# Patient Record
Sex: Female | Born: 1935
Health system: Southern US, Community
[De-identification: ages and names within clinical notes are randomized; demographics above are authoritative.]

## PROBLEM LIST (undated history)

## (undated) DIAGNOSIS — M419 Scoliosis, unspecified: Secondary | ICD-10-CM

## (undated) DIAGNOSIS — M810 Age-related osteoporosis without current pathological fracture: Secondary | ICD-10-CM

## (undated) DIAGNOSIS — F988 Other specified behavioral and emotional disorders with onset usually occurring in childhood and adolescence: Secondary | ICD-10-CM

## (undated) DIAGNOSIS — F32A Depression, unspecified: Secondary | ICD-10-CM

## (undated) DIAGNOSIS — K56609 Unspecified intestinal obstruction, unspecified as to partial versus complete obstruction: Secondary | ICD-10-CM

## (undated) DIAGNOSIS — M1990 Unspecified osteoarthritis, unspecified site: Secondary | ICD-10-CM

## (undated) DIAGNOSIS — I10 Essential (primary) hypertension: Secondary | ICD-10-CM

## (undated) DIAGNOSIS — C801 Malignant (primary) neoplasm, unspecified: Secondary | ICD-10-CM

## (undated) DIAGNOSIS — M4800 Spinal stenosis, site unspecified: Secondary | ICD-10-CM

## (undated) DIAGNOSIS — G709 Myoneural disorder, unspecified: Secondary | ICD-10-CM

## (undated) DIAGNOSIS — F329 Major depressive disorder, single episode, unspecified: Secondary | ICD-10-CM

## (undated) DIAGNOSIS — I4891 Unspecified atrial fibrillation: Secondary | ICD-10-CM

## (undated) DIAGNOSIS — K219 Gastro-esophageal reflux disease without esophagitis: Secondary | ICD-10-CM

## (undated) DIAGNOSIS — H269 Unspecified cataract: Secondary | ICD-10-CM

## (undated) HISTORY — PX: COLONOSCOPY: SHX174

## (undated) HISTORY — DX: Unspecified atrial fibrillation: I48.91

## (undated) HISTORY — DX: Unspecified cataract: H26.9

## (undated) HISTORY — PX: FRACTURE SURGERY: SHX138

## (undated) HISTORY — DX: Age-related osteoporosis without current pathological fracture: M81.0

## (undated) HISTORY — PX: EYE SURGERY: SHX253

## (undated) HISTORY — DX: Gastro-esophageal reflux disease without esophagitis: K21.9

## (undated) HISTORY — PX: JOINT REPLACEMENT: SHX530

## (undated) HISTORY — PX: SIGMOID RESECTION / RECTOPEXY: SUR1294

---

## 1970-10-11 HISTORY — PX: ABDOMINAL HYSTERECTOMY: SHX81

## 2009-01-04 ENCOUNTER — Encounter: Admission: RE | Admit: 2009-01-04 | Discharge: 2009-01-04 | Payer: Self-pay | Admitting: Neurology

## 2009-05-15 ENCOUNTER — Encounter: Admission: RE | Admit: 2009-05-15 | Discharge: 2009-05-15 | Payer: Self-pay | Admitting: Neurosurgery

## 2009-08-04 ENCOUNTER — Emergency Department (HOSPITAL_COMMUNITY): Admission: EM | Admit: 2009-08-04 | Discharge: 2009-08-04 | Payer: Self-pay | Admitting: Emergency Medicine

## 2010-02-05 ENCOUNTER — Encounter: Admission: RE | Admit: 2010-02-05 | Discharge: 2010-02-05 | Payer: Self-pay | Admitting: Neurosurgery

## 2010-04-23 ENCOUNTER — Encounter
Admission: RE | Admit: 2010-04-23 | Discharge: 2010-07-22 | Payer: Self-pay | Admitting: Physical Medicine & Rehabilitation

## 2010-05-01 ENCOUNTER — Ambulatory Visit: Payer: Self-pay | Admitting: Physical Medicine & Rehabilitation

## 2010-05-07 ENCOUNTER — Ambulatory Visit: Payer: Self-pay | Admitting: Physical Medicine & Rehabilitation

## 2010-05-28 ENCOUNTER — Ambulatory Visit: Payer: Self-pay | Admitting: Physical Medicine & Rehabilitation

## 2010-07-09 ENCOUNTER — Ambulatory Visit (HOSPITAL_COMMUNITY)
Admission: RE | Admit: 2010-07-09 | Discharge: 2010-07-09 | Payer: Self-pay | Admitting: Physical Medicine & Rehabilitation

## 2010-07-09 ENCOUNTER — Ambulatory Visit: Payer: Self-pay | Admitting: Physical Medicine & Rehabilitation

## 2010-07-28 ENCOUNTER — Encounter
Admission: RE | Admit: 2010-07-28 | Discharge: 2010-09-17 | Payer: Self-pay | Source: Home / Self Care | Attending: Physical Medicine & Rehabilitation | Admitting: Physical Medicine & Rehabilitation

## 2010-07-31 ENCOUNTER — Ambulatory Visit: Payer: Self-pay | Admitting: Physical Medicine & Rehabilitation

## 2010-08-13 ENCOUNTER — Ambulatory Visit: Payer: Self-pay | Admitting: Physical Medicine & Rehabilitation

## 2010-08-31 ENCOUNTER — Encounter
Admission: RE | Admit: 2010-08-31 | Discharge: 2010-10-10 | Payer: Self-pay | Source: Home / Self Care | Attending: Physical Medicine & Rehabilitation | Admitting: Physical Medicine & Rehabilitation

## 2010-09-17 ENCOUNTER — Ambulatory Visit: Payer: Self-pay | Admitting: Physical Medicine & Rehabilitation

## 2010-09-29 ENCOUNTER — Encounter
Admission: RE | Admit: 2010-09-29 | Discharge: 2010-10-06 | Payer: Self-pay | Source: Home / Self Care | Attending: Physical Medicine & Rehabilitation | Admitting: Physical Medicine & Rehabilitation

## 2010-10-12 ENCOUNTER — Encounter
Admission: RE | Admit: 2010-10-12 | Discharge: 2010-11-10 | Payer: Self-pay | Source: Home / Self Care | Attending: Physical Medicine & Rehabilitation | Admitting: Physical Medicine & Rehabilitation

## 2010-10-15 ENCOUNTER — Encounter
Admission: RE | Admit: 2010-10-15 | Discharge: 2010-11-10 | Payer: Self-pay | Source: Home / Self Care | Attending: Physical Medicine & Rehabilitation | Admitting: Physical Medicine & Rehabilitation

## 2010-10-16 ENCOUNTER — Ambulatory Visit
Admission: RE | Admit: 2010-10-16 | Discharge: 2010-10-16 | Payer: Self-pay | Source: Home / Self Care | Attending: Physical Medicine & Rehabilitation | Admitting: Physical Medicine & Rehabilitation

## 2010-10-26 ENCOUNTER — Ambulatory Visit: Admit: 2010-10-26 | Payer: Self-pay | Admitting: Physical Medicine & Rehabilitation

## 2010-10-27 ENCOUNTER — Encounter: Admit: 2010-10-27 | Payer: Self-pay | Admitting: Physical Medicine & Rehabilitation

## 2010-10-29 ENCOUNTER — Encounter: Admit: 2010-10-29 | Payer: Self-pay | Admitting: Physical Medicine & Rehabilitation

## 2010-11-13 ENCOUNTER — Ambulatory Visit: Admit: 2010-11-13 | Payer: Self-pay | Admitting: Physical Medicine & Rehabilitation

## 2010-11-13 ENCOUNTER — Ambulatory Visit: Payer: Medicare Other | Admitting: Physical Medicine & Rehabilitation

## 2010-11-13 ENCOUNTER — Ambulatory Visit: Payer: Medicare Other | Attending: Physical Medicine & Rehabilitation

## 2010-11-13 DIAGNOSIS — M19029 Primary osteoarthritis, unspecified elbow: Secondary | ICD-10-CM

## 2010-11-13 DIAGNOSIS — M249 Joint derangement, unspecified: Secondary | ICD-10-CM | POA: Insufficient documentation

## 2010-11-13 DIAGNOSIS — M7520 Bicipital tendinitis, unspecified shoulder: Secondary | ICD-10-CM | POA: Insufficient documentation

## 2010-11-13 DIAGNOSIS — M75100 Unspecified rotator cuff tear or rupture of unspecified shoulder, not specified as traumatic: Secondary | ICD-10-CM

## 2010-11-13 DIAGNOSIS — IMO0002 Reserved for concepts with insufficient information to code with codable children: Secondary | ICD-10-CM

## 2010-11-13 DIAGNOSIS — M19019 Primary osteoarthritis, unspecified shoulder: Secondary | ICD-10-CM | POA: Insufficient documentation

## 2010-11-17 ENCOUNTER — Ambulatory Visit: Payer: Medicare Other | Attending: Physical Medicine & Rehabilitation | Admitting: Rehabilitation

## 2010-11-17 DIAGNOSIS — IMO0001 Reserved for inherently not codable concepts without codable children: Secondary | ICD-10-CM | POA: Insufficient documentation

## 2010-11-17 DIAGNOSIS — M545 Low back pain, unspecified: Secondary | ICD-10-CM | POA: Insufficient documentation

## 2010-11-17 DIAGNOSIS — M25519 Pain in unspecified shoulder: Secondary | ICD-10-CM | POA: Insufficient documentation

## 2010-11-17 DIAGNOSIS — R293 Abnormal posture: Secondary | ICD-10-CM | POA: Insufficient documentation

## 2010-11-17 DIAGNOSIS — M25619 Stiffness of unspecified shoulder, not elsewhere classified: Secondary | ICD-10-CM | POA: Insufficient documentation

## 2010-11-19 ENCOUNTER — Ambulatory Visit: Payer: Medicare Other | Admitting: Rehabilitation

## 2010-11-24 ENCOUNTER — Ambulatory Visit: Payer: Medicare Other | Admitting: Rehabilitation

## 2010-12-15 ENCOUNTER — Ambulatory Visit: Payer: Medicare Other | Admitting: Physical Medicine & Rehabilitation

## 2011-03-31 ENCOUNTER — Encounter (HOSPITAL_COMMUNITY)
Admission: RE | Admit: 2011-03-31 | Discharge: 2011-03-31 | Disposition: A | Discharge status: Home / Self Care | Payer: Medicare Other | Source: Ambulatory Visit | Attending: Orthopedic Surgery | Admitting: Orthopedic Surgery

## 2011-03-31 ENCOUNTER — Other Ambulatory Visit (HOSPITAL_COMMUNITY): Payer: Self-pay | Admitting: Orthopedic Surgery

## 2011-03-31 ENCOUNTER — Ambulatory Visit (HOSPITAL_COMMUNITY)
Admission: RE | Admit: 2011-03-31 | Discharge: 2011-03-31 | Disposition: A | Discharge status: Home / Self Care | Payer: Medicare Other | Source: Ambulatory Visit | Attending: Orthopedic Surgery | Admitting: Orthopedic Surgery

## 2011-03-31 DIAGNOSIS — Z01811 Encounter for preprocedural respiratory examination: Secondary | ICD-10-CM

## 2011-03-31 DIAGNOSIS — Z01818 Encounter for other preprocedural examination: Secondary | ICD-10-CM | POA: Insufficient documentation

## 2011-03-31 DIAGNOSIS — M4120 Other idiopathic scoliosis, site unspecified: Secondary | ICD-10-CM | POA: Insufficient documentation

## 2011-03-31 DIAGNOSIS — M8448XA Pathological fracture, other site, initial encounter for fracture: Secondary | ICD-10-CM | POA: Insufficient documentation

## 2011-03-31 DIAGNOSIS — Z01812 Encounter for preprocedural laboratory examination: Secondary | ICD-10-CM | POA: Insufficient documentation

## 2011-03-31 LAB — CBC
HCT: 39.8 % (ref 36.0–46.0)
Hemoglobin: 13.3 g/dL (ref 12.0–15.0)
MCHC: 33.4 g/dL (ref 30.0–36.0)
RDW: 14.7 % (ref 11.5–15.5)
WBC: 7.4 10*3/uL (ref 4.0–10.5)

## 2011-03-31 LAB — DIFFERENTIAL
Basophils Absolute: 0 10*3/uL (ref 0.0–0.1)
Eosinophils Absolute: 0.1 10*3/uL (ref 0.0–0.7)
Lymphocytes Relative: 23 % (ref 12–46)
Lymphs Abs: 1.7 10*3/uL (ref 0.7–4.0)
Monocytes Relative: 5 % (ref 3–12)
Neutrophils Relative %: 71 % (ref 43–77)

## 2011-03-31 LAB — SURGICAL PCR SCREEN: MRSA, PCR: NEGATIVE

## 2011-03-31 LAB — PROTIME-INR: Prothrombin Time: 13.7 seconds (ref 11.6–15.2)

## 2011-03-31 LAB — URINALYSIS, ROUTINE W REFLEX MICROSCOPIC
Bilirubin Urine: NEGATIVE
Nitrite: NEGATIVE
pH: 6.5 (ref 5.0–8.0)

## 2011-03-31 LAB — COMPREHENSIVE METABOLIC PANEL
ALT: 21 U/L (ref 0–35)
AST: 20 U/L (ref 0–37)
Albumin: 3.7 g/dL (ref 3.5–5.2)
Calcium: 10 mg/dL (ref 8.4–10.5)
Chloride: 101 mEq/L (ref 96–112)
Creatinine, Ser: 0.62 mg/dL (ref 0.50–1.10)
GFR calc Af Amer: >60 mL/min (ref >60)
Potassium: 4.3 mEq/L (ref 3.5–5.1)
Total Protein: 6.9 g/dL (ref 6.0–8.3)

## 2011-03-31 LAB — ABO/RH: ABO/RH(D): A POS

## 2011-03-31 LAB — TYPE AND SCREEN: ABO/RH(D): A POS

## 2011-04-02 ENCOUNTER — Inpatient Hospital Stay (HOSPITAL_COMMUNITY): Payer: Medicare Other

## 2011-04-02 ENCOUNTER — Inpatient Hospital Stay (HOSPITAL_COMMUNITY)
Admission: RE | Admit: 2011-04-02 | Discharge: 2011-04-04 | DRG: 484 | Disposition: A | Discharge status: Home Health | Payer: Medicare Other | Source: Ambulatory Visit | Attending: Orthopedic Surgery | Admitting: Orthopedic Surgery

## 2011-04-02 DIAGNOSIS — I10 Essential (primary) hypertension: Secondary | ICD-10-CM | POA: Diagnosis present

## 2011-04-02 DIAGNOSIS — J45909 Unspecified asthma, uncomplicated: Secondary | ICD-10-CM | POA: Diagnosis present

## 2011-04-02 DIAGNOSIS — Z85038 Personal history of other malignant neoplasm of large intestine: Secondary | ICD-10-CM

## 2011-04-02 DIAGNOSIS — M19019 Primary osteoarthritis, unspecified shoulder: Principal | ICD-10-CM | POA: Diagnosis present

## 2011-04-03 LAB — BASIC METABOLIC PANEL: Sodium: 136 mEq/L (ref 135–145)

## 2011-04-03 LAB — CBC
HCT: 32.3 % — ABNORMAL LOW (ref 36.0–46.0)
Hemoglobin: 11.2 g/dL — ABNORMAL LOW (ref 12.0–15.0)
MCH: 31.5 pg (ref 26.0–34.0)
MCHC: 34.7 g/dL (ref 30.0–36.0)
MCV: 91 fL (ref 78.0–100.0)
RBC: 3.55 MIL/uL — ABNORMAL LOW (ref 3.87–5.11)
WBC: 9.2 10*3/uL (ref 4.0–10.5)

## 2011-04-04 LAB — CBC
Hemoglobin: 10.6 g/dL — ABNORMAL LOW (ref 12.0–15.0)
MCHC: 34.2 g/dL (ref 30.0–36.0)
RBC: 3.42 MIL/uL — ABNORMAL LOW (ref 3.87–5.11)
RDW: 14.8 % (ref 11.5–15.5)

## 2011-04-04 LAB — BASIC METABOLIC PANEL
BUN: 10 mg/dL (ref 6–23)
CO2: 30 mEq/L (ref 19–32)
Chloride: 101 mEq/L (ref 96–112)
GFR calc non Af Amer: >60 mL/min (ref >60)
Glucose, Bld: 121 mg/dL — ABNORMAL HIGH (ref 70–99)
Sodium: 137 mEq/L (ref 135–145)

## 2011-04-30 NOTE — H&P (Signed)
  NAMEERMAL, Brianna Jacobs                 ACCOUNT NO.:  [id number]  MEDICAL RECORD NO.:  [id number]  LOCATION:  [place]                         FACILITY:  [facility]  PHYSICIAN:  [name]. [name], M.D. DATE OF BIRTH:  14-Aug-1930  DATE OF ADMISSION:  03/15/2011 DATE OF DISCHARGE:                             HISTORY & PHYSICAL   CHIEF COMPLAINT:  Left shoulder pain.  HISTORY OF PRESENT ILLNESS:  The patient is a 77 year old female with worsening left shoulder pain secondary to end-stage osteoarthritis.  The patient has elected to have a left total shoulder arthroplasty versus reverse total shoulder arthroplasty to decrease pain and increase function of that left shoulder.  PAST MEDICAL HISTORY: 1. Asthma. 2. Hypertension. 3. Shingles. 4. History of colon cancer with resection of her sigmoid.  FAMILY HISTORY:  Coronary artery disease and cancer.  SOCIAL HISTORY:  Does not smoke or use alcohol.  Patient of Dr. [name].  DRUG ALLERGIES:  NAPROSYN.  CURRENT MEDICATIONS: 1. Bupropion 150 mg daily. 2. Losartan 50 mg daily. 3. Neurontin 100 mg t.i.d. 4. Restasis 1 t.i.d.  REVIEW OF SYSTEMS:  Pain on range of motion of the left shoulder and decreased strength.  She does have a previous right total shoulder arthroplasty.  She has good range of motion and function well.  PHYSICAL EXAMINATION:  VITAL SIGNS:  Pulse 68, respirations 16, and blood pressure 138/68. GENERAL:  The patient is a healthy-appearing 77 year old female in no acute distress.  Pleasant mood and affect.  Alert and oriented x3. HEAD AND NECK:  Cranial nerves II-XII grossly intact.  She has full range of motion without any tenderness or issues. CHEST:  Active breath sounds bilaterally.  No wheezes, rhonchi, or rales. HEART:  Regular rate and rhythm.  No murmur. ABDOMEN:  Nontender and nondistended with active bowel sounds. EXTREMITIES:  Moderate crepitus of the left shoulder with range of motion with forward flexion at  about 90 degrees to 100 degrees and external rotation at 30 degrees and internal rotation to her abdomen. Capillary refill was less than 2 seconds. NEUROLOGIC:  She is intact. SKIN:  She has no rashes or edema.  IMAGING:  X-rays show end-stage osteoarthritis of the left shoulder.  IMPRESSION:  End-stage osteoarthritis, left shoulder.  PLAN OF ACTION:  Left total shoulder arthroplasty by Dr. [name].     Thomas B. Dixon, P.A.   ______________________________ [name]. [name], M.D.    TBD/MEDQ  D:  03/25/2011  T:  03/25/2011  Job:  [id number]  Electronically Signed by Standley Dakins P.A. on 04/06/2011 08:32:13 AM Electronically Signed by [name]  on 04/30/2011 12:06:48 AM

## 2011-04-30 NOTE — Discharge Summary (Signed)
  Brianna Jacobs, LOPEZPEREZ NO.:  [id number]  MEDICAL RECORD NO.:  [id number]  LOCATION:  5036                         FACILITY:  [facility]  PHYSICIAN:  [name]. [name], M.D. DATE OF BIRTH:  Jan 01, 1930  DATE OF ADMISSION:  04/02/2011 DATE OF DISCHARGE:  04/04/2011                              DISCHARGE SUMMARY   ADMISSION DIAGNOSIS:  Left shoulder pain secondary to rotator cuff insufficiency.  DISCHARGE DIAGNOSIS:  Left shoulder pain secondary to rotator cuff insufficiency status post reverse total shoulder arthroplasty.  BRIEF HISTORY:  The patient is a 77 year old female with worsening left shoulder pain and function secondary to rotator cuff arthropathy and rotator cuff insufficiency.  The patient elected to have surgery to decrease pain and increase function of left upper extremity.  PROCEDURE:  The patient had a left total shoulder arthroplasty reversed by Dr. [name] on April 02, 2011.  Assistant was [employer]. General anesthesia was used.  No complications.  HOSPITAL COURSE:  The patient was admitted on April 02, 2011, for the above-stated procedure which she tolerated well.  After adequate time in Post Anesthesia Care Unit, she was transferred to 5000.  Postop day #1, the patient was complaining about minimal pain to the left shoulders, able to work with physical therapy quite well.  The patient discharged home on postop day #2 doing rather well with no signs of infection.  Her wound was healing well and labs all in acceptable limits. Neurovascularly, she is intact.  She did quite well throughout her hospital stay.  DISCHARGE/PLAN:  The patient will be discharged home on April 04, 2011. Her condition is stable.  Her diet is regular.  In additions to her medication list are Percocet 5/325 one to two tablets q.4-6 h. p.r.n. pain, Robaxin 500 mg p.o. q.6 h.     Thomas B. Dixon, P.A.   ______________________________ [name]. [name],  M.D.    TBD/MEDQ  D:  04/06/2011  T:  04/06/2011  Job:  [id number]  Electronically Signed by Standley Dakins P.A. on 04/20/2011 08:37:29 AM Electronically Signed by [name]  on 04/30/2011 12:06:55 AM

## 2011-04-30 NOTE — Op Note (Signed)
NAMECHARMA, MOCARSKI NO.:  [id number]  MEDICAL RECORD NO.:  [id number]  LOCATION:  5036                         FACILITY:  [facility]  PHYSICIAN:  [name]. [name], M.D. DATE OF BIRTH:  September 17, 1930  DATE OF PROCEDURE:  04/02/2011 DATE OF DISCHARGE:                              OPERATIVE REPORT   PREOPERATIVE DIAGNOSIS:  Left shoulder rotator cuff insufficiency with rotator cuff tear arthropathy.  POSTOPERATIVE DIAGNOSIS:  Left shoulder rotator cuff insufficiency with rotator cuff tear arthropathy.  PROCEDURE PERFORMED:  Left shoulder reverse total shoulder arthroplasty using Zimmer implant.  ATTENDING SURGEON:  [name]. [name], MD  ASSISTANT:  Donnie Coffin. Dixon, PA-C  ANESTHESIA:  General anesthesia was used plus interscalene block.  ESTIMATED BLOOD LOSS:  Less than 50 mL.  FLUID REPLACEMENT:  1200 mL crystalloid.  INSTRUMENT COUNTS:  Correct.  COMPLICATIONS:  None.  Preoperative antibiotics were given.  INDICATIONS:  The patient is a 77 year old female with worsening left shoulder pain secondary to rotator cuff insufficiency and arthritis. The patient has failed extensive period of conservative management including injections, modification of activity, antiinflammatory medication and pain relievers.  The patient presents now refractory shoulder pain and functional loss desiring total shoulder arthroplasty. Due to concern of the patient's rotator cuff, insufficiency in manual motor testing, and also atrophy is noted on MRI.  We counseled the patient regarding more predictable outcome using a reverse shoulder implant.  The patient understood that was want to proceed and informed consent obtained.  DESCRIPTION OF PROCEDURE:  After an adequate level of anesthesia achieved, the patient was positioned in modified beach-chair position. Left shoulder was sterilely prepped and draped in usual manner. Deltopectoral approach was utilized starting at  the  coracoid process extending down to the anterior humeral shaft using a 10 blade. Subcutaneous dissection with the Bovie.  Cephalic vein identified, taken laterally with the deltoid.  Deltopectoral interval entered.  The pectoralis retracted medially.  Conjoined tendon identified, retracted medially.  We released the subscapularis directly off the biceps and the lesser tuberosity, noted there to be advanced atrophy and really a thinning of the subscapularis.  We went ahead and released the capsule off the anterior glenohumeral joint and then performed a progressive release off the inferior humeral neck as we progressively externally rotated.  We released just the supraspinatus tendon, which did looked very degenerative leaving infraspinatus and teres minor in the back intact.  We then went ahead and placed our intramedullary guide for the humerus and cut her humeral head, set on a 10 degrees of retroversion with the intramedullary resection guide.  Next, we went ahead and continued our humeral preparation with the two metaphysis reamers and then placed a real size 12 Press-Fit implant.  We did the trial actually size 12 Press-Fit on the humeral side.  Once that was in place, we went and retracted the humerus posteriorly, performed a 360 degree release of the capsule, such we could see the glenoid face well.  There was really no cartilage remaining on the glenoid at all, but good bone stock remaining, so we went ahead and drilled our drill pin central at a 28-mm arc, and then did our  reaming, and then went ahead and drilled out for the trabecular metal metaglene implant and then impacted that in place then we went ahead and placed two screws superiorly and inferiorly in the good bone and then locked those with a locking screw cap. We were happy with the inferior position.  The alignment of the metaglene.  We then placed the glenosphere, which was a 36 glenosphere, which went ahead and placed  it on the taper and impacted that in position.  We are again happy with that.  We then went ahead and trialed with a +3 trial insert for the 36 head and then reduced the shoulder.  We were happy with soft tissue balancing.  We then removed the trial humeral implant, impacted the real trabecular metal implant into position.  This was a 12 mm diameter distal and then impacted that in place.  We then reduced the shoulder, placed the real +3 insert and packed that in place.  We did place sutures prior to impacting the stem for subscap repair.  Once we reduced the shoulder, we were happy with the negative sulcus.  We had nice tension on the conjoined tendon and no gapping with external rotation.  We then repaired our soft tissue bringing that a bit of infraspinatus back up and repairing that to our back to the subscapularis basically creating a bit of the rotator interval there, which provided some nice stability.  This did not limit our external rotation.  We could still externally rotate to 45 degrees and fully forward flex with no tension, but we did have secure tendon to bone fixation on the subscap, and also infraspinatus.  We were pleased with the result.  We thoroughly irrigated the wound, closed the wound in layers with the deltopectoral interval first with 0 Vicryl suture followed by 2-0 Vicryl subcutaneous closure and 4-0 Monocryl for skin. Steri-Strips applied followed by sterile dressing.  The patient tolerated the surgery well.     [name]. [name], M.D.     SRN/MEDQ  D:  04/02/2011  T:  04/03/2011  Job:  [id number]  Electronically Signed by [name]  on 04/30/2011 12:06:51 AM

## 2011-10-15 DIAGNOSIS — M48061 Spinal stenosis, lumbar region without neurogenic claudication: Secondary | ICD-10-CM | POA: Diagnosis not present

## 2011-10-15 DIAGNOSIS — IMO0002 Reserved for concepts with insufficient information to code with codable children: Secondary | ICD-10-CM | POA: Diagnosis not present

## 2011-10-15 DIAGNOSIS — G894 Chronic pain syndrome: Secondary | ICD-10-CM | POA: Diagnosis not present

## 2011-10-29 DIAGNOSIS — M546 Pain in thoracic spine: Secondary | ICD-10-CM | POA: Diagnosis not present

## 2011-11-02 DIAGNOSIS — M546 Pain in thoracic spine: Secondary | ICD-10-CM | POA: Diagnosis not present

## 2011-11-10 ENCOUNTER — Encounter (HOSPITAL_COMMUNITY): Payer: Self-pay | Admitting: Pharmacy Technician

## 2011-11-15 ENCOUNTER — Encounter (HOSPITAL_COMMUNITY): Payer: Self-pay

## 2011-11-15 ENCOUNTER — Other Ambulatory Visit: Payer: Self-pay

## 2011-11-15 ENCOUNTER — Encounter (HOSPITAL_COMMUNITY)
Admission: RE | Admit: 2011-11-15 | Discharge: 2011-11-15 | Disposition: A | Discharge status: Home / Self Care | Payer: Medicare Other | Source: Ambulatory Visit | Attending: Physician Assistant | Admitting: Physician Assistant

## 2011-11-15 ENCOUNTER — Encounter (HOSPITAL_COMMUNITY)
Admission: RE | Admit: 2011-11-15 | Discharge: 2011-11-15 | Disposition: A | Discharge status: Home / Self Care | Payer: Medicare Other | Source: Ambulatory Visit | Attending: Orthopedic Surgery | Admitting: Orthopedic Surgery

## 2011-11-15 DIAGNOSIS — M1990 Unspecified osteoarthritis, unspecified site: Secondary | ICD-10-CM | POA: Diagnosis not present

## 2011-11-15 DIAGNOSIS — G8929 Other chronic pain: Secondary | ICD-10-CM | POA: Diagnosis not present

## 2011-11-15 DIAGNOSIS — Z01812 Encounter for preprocedural laboratory examination: Secondary | ICD-10-CM | POA: Diagnosis not present

## 2011-11-15 DIAGNOSIS — Z01810 Encounter for preprocedural cardiovascular examination: Secondary | ICD-10-CM | POA: Diagnosis not present

## 2011-11-15 DIAGNOSIS — J45909 Unspecified asthma, uncomplicated: Secondary | ICD-10-CM | POA: Diagnosis not present

## 2011-11-15 DIAGNOSIS — Z01818 Encounter for other preprocedural examination: Secondary | ICD-10-CM | POA: Diagnosis not present

## 2011-11-15 DIAGNOSIS — M79609 Pain in unspecified limb: Secondary | ICD-10-CM | POA: Diagnosis not present

## 2011-11-15 DIAGNOSIS — M549 Dorsalgia, unspecified: Secondary | ICD-10-CM | POA: Diagnosis not present

## 2011-11-15 DIAGNOSIS — I10 Essential (primary) hypertension: Secondary | ICD-10-CM | POA: Diagnosis not present

## 2011-11-15 HISTORY — DX: Major depressive disorder, single episode, unspecified: F32.9

## 2011-11-15 HISTORY — DX: Essential (primary) hypertension: I10

## 2011-11-15 HISTORY — DX: Unspecified osteoarthritis, unspecified site: M19.90

## 2011-11-15 HISTORY — DX: Depression, unspecified: F32.A

## 2011-11-15 HISTORY — DX: Malignant (primary) neoplasm, unspecified: C80.1

## 2011-11-15 HISTORY — DX: Scoliosis, unspecified: M41.9

## 2011-11-15 HISTORY — DX: Spinal stenosis, site unspecified: M48.00

## 2011-11-15 HISTORY — DX: Other specified behavioral and emotional disorders with onset usually occurring in childhood and adolescence: F98.8

## 2011-11-15 HISTORY — DX: Unspecified intestinal obstruction, unspecified as to partial versus complete obstruction: K56.609

## 2011-11-15 HISTORY — DX: Myoneural disorder, unspecified: G70.9

## 2011-11-15 LAB — BASIC METABOLIC PANEL
CO2: 29 mEq/L (ref 19–32)
Chloride: 103 mEq/L (ref 96–112)
Creatinine, Ser: 0.82 mg/dL (ref 0.50–1.10)
GFR calc Af Amer: 77 mL/min — ABNORMAL LOW (ref >90)
Potassium: 4.2 mEq/L (ref 3.5–5.1)
Sodium: 139 mEq/L (ref 135–145)

## 2011-11-15 LAB — CBC
HCT: 38.3 % (ref 36.0–46.0)
Hemoglobin: 12.5 g/dL (ref 12.0–15.0)
MCV: 94.8 fL (ref 78.0–100.0)
RBC: 4.04 MIL/uL (ref 3.87–5.11)
RDW: 13.9 % (ref 11.5–15.5)
WBC: 7.2 10*3/uL (ref 4.0–10.5)

## 2011-11-15 LAB — SURGICAL PCR SCREEN
MRSA, PCR: NEGATIVE
Staphylococcus aureus: NEGATIVE

## 2011-11-15 NOTE — H&P (Signed)
Brianna Jacobs 11/15/2011 9:38 AM Location: [facility] Patient #: [id number] DOB: 1930/08/15 Married / Language: [place] / Race: White Female   History of Present Illness(Blaize Epple Dierdre Highman, PA-C; 11/15/2011 10:52 AM) The patient is a 78 year old female who comes in today for a preoperative History and Physical. The patient is scheduled for a spinal cord stimulator for chonic low back pain to be performed by Dr. Debria Garret D. Shon Baton, MD at [facility] on Thursday, November 18, 2011 at 10:30AM .    Allergies(Lundyn Coste Dierdre Highman, PA-C; 11/15/2011 9:59 AM) Roselee Nova. dizziness NAPROSYN CELEBREX 200MG . dizziness CYMBALTA   Family History(Sharon Gillian Shields; 11/15/2011 9:39 AM) Cancer. mother, sister and brother Heart Disease. father and brother Heart disease in female family member before age 28 Osteoarthritis. brother   Social History(Sharon Gillian Shields; 11/15/2011 9:39 AM) Alcohol use. former drinker Children. 3 Current work status. retired Financial planner (Currently). no Drug/Alcohol Rehab (Previously). no Exercise. Exercises rarely Illicit drug use. no Living situation. live with spouse Marital status. married Tobacco / smoke exposure. no Tobacco use. Former smoker. former smoker; smoke(d) 1 pack(s) per day   Medication History(Temitope Griffing J Demetrica Zipp, PA-C; 11/15/2011 10:10 AM) Losartan Potassium (50MG  Tablet, 1 Oral daily) Active. Wellbutrin SR (150MG  Tablet ER 12HR, 1 Oral daily) Active. Pataday (0.2% Solution, 1 drop each eye Ophthalmic daily) Active. Cetirizine HCl (10MG  Tablet Chewable, 1 Oral daily) Active. Gabapentin (100MG  Capsule, 2 Oral TID) Active. OxyCONTIN (20MG  Tablet ER 12HR, 1 Oral qhs) Active. Percocet (10-325MG  Tablet, 1 Oral QID) Active.   Pregnancy / Birth History(Sharon Gillian Shields; 11/15/2011 9:39 AM) Pregnant. no   Past Surgical History(Sharon Gillian Shields; 11/15/2011 9:39 AM) Arthroscopy of Shoulder. bilateral Breast Mass; Local  Excision. bilateral Cataract Surgery. bilateral Colectomy. partial Hysterectomy. partial (non-cancerous) Tonsillectomy   Other Problems(Sharon Gillian Shields; 11/15/2011 9:39 AM) Asthma Colon Cancer Oophorectomy. right Osteoarthritis Osteoporosis Peripheral Neuropathy Ulcerative Colitis   Review of Systems(Sharon Gillian Shields; 11/15/2011 9:39 AM) General:Present- Fatigue and Weight Gain. Not Present- Chills, Fever, Night Sweats, Appetite Loss, Feeling sick and Weight Loss. Skin:Present- Itching and Change in Hair or Nails. Not Present- Rash, Skin Color Changes, Ulcer and Psoriasis. HEENT:Present- Nose Bleed and Ringing in the Ears. Not Present- Sensitivity to light and Hearing problems. Neck:Not Present- Swollen Glands and Neck Mass. Respiratory:Present- Dyspnea. Not Present- Snoring, Chronic Cough and Bloody sputum. Cardiovascular:Present- Leg Cramps. Not Present- Shortness of Breath, Chest Pain, Swelling of Extremities and Palpitations. Musculoskeletal:Present- Muscle Weakness, Joint Stiffness, Joint Pain and Back Pain. Not Present- Muscle Pain and Joint Swelling. Neurological:Present- Tingling, Numbness and Burning. Not Present- Tremor, Headaches and Dizziness. Psychiatric:Not Present- Anxiety, Depression and Memory Loss. Endocrine:Present- Cold Intolerance. Not Present- Heat Intolerance, Excessive hunger and Excessive Thirst. Hematology:Present- Easy Bruising. Not Present- Abnormal Bleeding, Anemia and Blood Clots.   Vitals(Sharon Gillian Shields; 11/15/2011 9:39 AM) 11/15/2011 9:38 AM Weight: 140 lb Height: 61 in Body Surface Area: 1.65 m Body Mass Index: 26.45 kg/m BP: 167/79 (Sitting, Left Arm, Standard)    Physical Exam(Alyson Ki J Marisabel Macpherson, PA-C; 11/15/2011 10:49 AM) The physical exam findings are as follows:   General General Appearance- pleasant. Not in acute distress. Orientation- Oriented X3. Build & Nutrition- Well nourished and Well developed.  Posture- Normal posture. Gait- Normal. Mental Status- Alert.   Integumentary Cervical Spine- Skin examination of the cervical spine is without deformity, skin lesions, lacerations or abrasions. Lumbar Spine- Skin examination of the lumbar spine is without deformity, skin lesions, lacerations or abrasions.   Head and Neck Neck Global Assessment- supple.  no lymphadenopathy and no nucchal rigidty.   Eye Pupil- Bilateral- Normal, Direct reaction to light normal, Equal and Regular. Motion- Bilateral- EOMI.   Chest and Lung Exam Auscultation: Breath sounds:- Clear.   Cardiovascular Auscultation:Rhythm- Regular rate and rhythm. Heart Sounds- Normal heart sounds.   Abdomen Palpation/Percussion:Palpation and Percussion of the abdomen reveal - Non Tender, No Rebound tenderness and Soft.   Peripheral Vascular Lower Extremity:Inspection- Bilateral- Inspection Normal. Palpation:Posterior tibial pulse- Bilateral- 2+. Dorsalis pedis pulse- Bilateral- 2+.   Neurologic Sensation:Lower Extremity- Bilateral- sensation is intact in the lower extremity. Reflexes:Patellar Reflex- Bilateral- 2+. Achilles Reflex- Bilateral- 2+. Babinski- Right- Babinski not present. Clonus- Bilateral- clonus not present. Hoffman's Sign- Bilateral- Hoffman's sign not present. Testing:Seated Straight Leg Raise- Bilateral- Seated straight leg raise negative.   Musculoskeletal Spine/Ribs/Pelvis Lumbosacral Spine:Inspection and Palpation- Tenderness- generalized. bony and soft tissue palpation of the lumbar spine and SI joint does not recreate their typical pain. Strength and Tone: Strength:Hip Flexion- Bilateral- 5/5. Knee Extension- Bilateral- 5/5. Knee Flexion- Bilateral- 5/5. Ankle Dorsiflexion- Bilateral- 5/5. Ankle Plantarflexion- Bilateral- 5/5. Heel walk- Bilateral- able to heel walk without difficulty. Toe Walk- Bilateral- able to walk on  toes without difficulty. Heel-Toe Walk- Bilateral- able to heel-toe walk without difficulty. ROM- Flexion- mildly decreased range of motion and painful. Extension- mildly decreased range of motion and painful. Pain:- neither flexion or extension is more painful than the other. Waddell's Signs- no Waddell's signs present. Lower Extremity Range of Motion:- No true hip, knee or ankle pain with range of motion. Gait and Station:Assistive Devices- no assistive devices.   Assessment & Plan(Josephmichael Lisenbee J [facility], PA-C; 11/15/2011 10:53 AM) Chronic pain syndrome (338.4)  Note: Unfortunately conservative measures consisting observation, activity modification, physical therapy, oral pain medications and injection therapy have failed to alleviate her symptoms and given the ongoing nature of her pain and the decrease in her quality of life, she wishes to proceed with surgery. Risk/benefits/alternatives to surgery/expectations following surgery have been addressed with the patient by Dr. Shon Baton. She currently sees Dr. Ethelene Hal for pain management and I have informed her that as we typically do we will manage her medications initially postoperatively before having her resume pain management with Dr. Ethelene Hal. She understands.  She has been medically cleared for surgery by Dr. [name] on 07/21/11. Unfortunately she did not bring her complete list of medications with her today and I have informed her that she must bring this with her for her pre-op visit.   MRI of the thoracic spine dated 10/29/11 has been reviewed by Dr. Shon Baton. MRI of the thoracic spine demonstrates multiple compression fractures of T2, T10, and T12, multi-level degenerative changes, most prominent at C7-T1 but no cord signal change, no significant spinal stenosis that would prohibit implantation of the device. Please see the scanned MRI and clearance in the office chart for complete details.   She has not yet been fitted for a corset brace  and I have informed her that as we are close to the dat of surgery, we will likely have her fitted for this while in the hospital. She is scheduled to complete his pre-op hospital requirements later this morning.   All of her questions have been encouraged, addressed and answered. Plan, at this time, is to proceed with surgery as scheduled.   Signed electronically by Gwinda Maine, PA-C (11/15/2011 10:53 AM)    Brianna Jacobs 11/02/2011 3:00 PM Location: [facility] Patient #: [id number] DOB: 05/16/1930 Married / Language: [place] / Race: White Female  History of Present Illness(Brianna Jacobs; 11/02/2011 3:06 PM) The patient is a 78 year old female who presents with back pain. The patient is here today in referral from Dr Ethelene Hal . The patient reports low back symptoms including pain following a specific injury (Fx of T12 about 7-8 yrs ago with pain since ). and Symptoms include pain (radiating into bilat. lower ext. ), numbness and weakness (at times about bilat. lower ext. ). Current treatment includes opioid analgesics and muscle relaxants. Past evaluation has included x-ray of the lumbar spine, MRI of the lumbar spine (thoracic [facility] [phone]) and pain management evaluation.    Subjective Transcription(DAHARI Sheela Stack, MD; 11/04/2011 8:53 PM)  She presents today for a consultation concerning spinal cord stimulator implantation. She's had a successful trial with Dr. Ethelene Hal. She had improvement of her pain and since the device has been out, she has started having increasing tingling and dysesthesias in the lower extremities and increased back pain.    Allergies(Brianna W Lamb; 11/02/2011 3:06 PM) Roselee Nova NAPROSYN CELEBREX 200MG  CYMBALTA   Medication History(Brianna W Lamb; 11/02/2011 3:07 PM) Losartan Potassium (50MG  Tablet, Oral) Active. Buproban (150MG  Tablet ER 12HR, Oral) Active. Gabapentin (100MG  Tablet, Oral) Active. Cetirizine HCl (10MG  Tablet  Chewable, Oral) Active. Melatonin (10MG  Tablet, Oral) Active. Multiple Vitamin ( Oral) Active. Calcium-Vitamin D ( Oral) Specific dose unknown - Active. Vitamin B-12 ( Tablet, Oral) Active. Aspirin Child (81MG  Tablet Chewable, Oral) Active. Fish Oil (600MG  Capsule, Oral) Active. Align ( Oral) Active. Robaxin (500MG  Tablet, 1 (one) Oral every six hours, as needed for spasm, Taken 10/12/2011 to 10/27/2011) Inactive. Percocet (10-325MG  Tablet, 1 Oral four times daily, as needed, Taken 09/11/2011 to 10/11/2011) Inactive. OxyCONTIN (20MG  Tablet ER 12HR, 1 Oral q 12 hrs, Taken 09/11/2011 to 10/11/2011) Inactive.   Past Surgical History(Brianna Jacobs; 11/02/2011 3:09 PM) Shoulder Surgery. bilat. arthroplasty Hysterectomy (not due to cancer) - Partial Abdominal Surgery colon resection   Vitals(Brianna Jacobs; 11/02/2011 3:11 PM) 11/02/2011 3:09 PM Weight: 140 lb Height: 61 in Body Surface Area: 1.65 m Body Mass Index: 26.45 kg/m BP: 160/84 (Sitting, Left Arm, Standard)    Objective Transcription(DAHARI D BROOKS, MD; 11/04/2011 8:53 PM)  On clinical exam, she is a pleasant woman, who appears younger than her stated age. She's alert and oriented times 3. She has back pain with palpation and range of motion. Negative Babinski test. No clonus. Symmetrical DTRs at the knee and ankle, both 2+. No real hip, knee or ankle pain with isolated joint ROM. Compartments are soft and nontender. She has discomfort with ROM of the lumbar spine. No shortness of breath or chest pain. Abdomen is soft and nontender.    MRI of the thoracic spine demonstrates multiple compression fractures of T2, T10, and T12, multi-level degenerative changes, most prominent at C7-T1 but no cord signal change, no significant spinal stenosis that would prohibit implantation of the device.    Plans Transcription(DAHARI D BROOKS, MD; 11/04/2011 8:53 PM)  At this point and time, we have gone over  the surgical procedure which would be a 2-incision technique with implanting of the stimulator and the battery. The risks, as I explained to the patient and her husband, include infection, bleeding, nerve damage, death, stroke, paralysis, loss of bowel or bladder control, major bleeding, need for further surgery, implant failure, implant migration. All of their questions were encouraged and addressed. We will plan on proceeding with this as soon as we have medical clearance. She will be in the hospital  probably overnight and go home the next day. We will begin restrictive weights and ambulating as soon as possible.    Miscellaneous Transcription(DAHARI Sheela Stack, MD; 11/04/2011 8:53 PM)  Venita Lick, M. D./slk    T: 11-03-11  D: 11-02-11      Signed electronically by Alvy Beal, MD (11/04/2011 8:57 PM)

## 2011-11-15 NOTE — Pre-Procedure Instructions (Signed)
20 LYNN[facility] FULP  11/15/2011   Your procedure is scheduled on:  Feb 7 (Thursday)  Report to [facility] at 8:30 AM.  Call this number if you have problems the morning of surgery: [phone]   Remember:   Do not eat food:After Midnight.  May have clear liquids: up to 4 Hours before arrival. (4:30am)  Clear liquids include soda, tea, black coffee, apple or grape juice, broth.  Take these medicines the morning of surgery with A SIP OF WATER: inhaler, eye drop, pain pill   Do not wear jewelry, make-up or nail polish.  Do not wear lotions, powders, or perfumes. You may wear deodorant.  Do not shave 48 hours prior to surgery.  Do not bring valuables to the hospital.  Contacts, dentures or bridgework may not be worn into surgery.  Leave suitcase in the car. After surgery it may be brought to your room.  For patients admitted to the hospital, checkout time is 11:00 AM the day of discharge.   Patients discharged the day of surgery will not be allowed to drive home.  Name and phone number of your driver: Michell Heinrich [phone]  Special Instructions: Incentive Spirometry - Practice and bring it with you on the day of surgery. and CHG Shower Use Special Wash: 1/2 bottle night before surgery and 1/2 bottle morning of surgery.   Please read over the following fact sheets that you were given: Pain Booklet, MRSA Information and Surgical Site Infection Prevention

## 2011-11-17 MED ORDER — LACTATED RINGERS IV SOLN: INTRAVENOUS | Status: DC

## 2011-11-17 MED ORDER — CEFAZOLIN SODIUM-DEXTROSE 2-3 GM-% IV SOLR
2.0000 g | INTRAVENOUS | Status: AC
  Administered 2011-11-18: 2 g via INTRAVENOUS
  Filled 2011-11-17: qty 50

## 2011-11-18 ENCOUNTER — Encounter (HOSPITAL_COMMUNITY)
Admission: RE | Disposition: A | Discharge status: Home / Self Care | Payer: Self-pay | Source: Ambulatory Visit | Attending: Orthopedic Surgery

## 2011-11-18 ENCOUNTER — Encounter (HOSPITAL_COMMUNITY): Payer: Self-pay | Admitting: Anesthesiology

## 2011-11-18 ENCOUNTER — Ambulatory Visit (HOSPITAL_COMMUNITY): Payer: Medicare Other | Admitting: Anesthesiology

## 2011-11-18 ENCOUNTER — Ambulatory Visit (HOSPITAL_COMMUNITY): Payer: Medicare Other

## 2011-11-18 ENCOUNTER — Encounter (HOSPITAL_COMMUNITY): Payer: Self-pay | Admitting: *Deleted

## 2011-11-18 ENCOUNTER — Encounter (HOSPITAL_COMMUNITY): Payer: Self-pay

## 2011-11-18 ENCOUNTER — Ambulatory Visit (HOSPITAL_COMMUNITY)
Admission: RE | Admit: 2011-11-18 | Discharge: 2011-11-19 | Disposition: A | Discharge status: Home / Self Care | Payer: Medicare Other | Source: Ambulatory Visit | Attending: Orthopedic Surgery | Admitting: Orthopedic Surgery

## 2011-11-18 DIAGNOSIS — Z01810 Encounter for preprocedural cardiovascular examination: Secondary | ICD-10-CM | POA: Insufficient documentation

## 2011-11-18 DIAGNOSIS — M79609 Pain in unspecified limb: Secondary | ICD-10-CM | POA: Diagnosis not present

## 2011-11-18 DIAGNOSIS — G8929 Other chronic pain: Secondary | ICD-10-CM

## 2011-11-18 DIAGNOSIS — M545 Low back pain: Secondary | ICD-10-CM | POA: Diagnosis not present

## 2011-11-18 DIAGNOSIS — M1990 Unspecified osteoarthritis, unspecified site: Secondary | ICD-10-CM | POA: Insufficient documentation

## 2011-11-18 DIAGNOSIS — Z01812 Encounter for preprocedural laboratory examination: Secondary | ICD-10-CM | POA: Insufficient documentation

## 2011-11-18 DIAGNOSIS — Z5189 Encounter for other specified aftercare: Secondary | ICD-10-CM | POA: Diagnosis not present

## 2011-11-18 DIAGNOSIS — M5412 Radiculopathy, cervical region: Secondary | ICD-10-CM | POA: Diagnosis not present

## 2011-11-18 DIAGNOSIS — M549 Dorsalgia, unspecified: Secondary | ICD-10-CM | POA: Diagnosis not present

## 2011-11-18 DIAGNOSIS — J45909 Unspecified asthma, uncomplicated: Secondary | ICD-10-CM | POA: Insufficient documentation

## 2011-11-18 DIAGNOSIS — I10 Essential (primary) hypertension: Secondary | ICD-10-CM | POA: Insufficient documentation

## 2011-11-18 DIAGNOSIS — Z01818 Encounter for other preprocedural examination: Secondary | ICD-10-CM | POA: Insufficient documentation

## 2011-11-18 DIAGNOSIS — G894 Chronic pain syndrome: Secondary | ICD-10-CM | POA: Diagnosis not present

## 2011-11-18 HISTORY — PX: SPINAL CORD STIMULATOR INSERTION: SHX5378

## 2011-11-18 SURGERY — INSERTION, SPINAL CORD STIMULATOR, LUMBAR
Anesthesia: General | Site: Back | Wound class: Clean

## 2011-11-18 MED ORDER — CEFAZOLIN SODIUM 1-5 GM-% IV SOLN
1.0000 g | Freq: Three times a day (TID) | INTRAVENOUS | Status: AC
  Administered 2011-11-18 (×2): 1 g via INTRAVENOUS
  Filled 2011-11-18 (×2): qty 50

## 2011-11-18 MED ORDER — METHOCARBAMOL 100 MG/ML IJ SOLN
500.0000 mg | Freq: Four times a day (QID) | INTRAMUSCULAR | Status: DC | PRN
  Filled 2011-11-18: qty 5

## 2011-11-18 MED ORDER — MEPERIDINE HCL 25 MG/ML IJ SOLN: 6.2500 mg | INTRAMUSCULAR | Status: DC | PRN

## 2011-11-18 MED ORDER — THROMBIN 20000 UNITS EX KIT: PACK | CUTANEOUS | Status: DC | PRN

## 2011-11-18 MED ORDER — MENTHOL 3 MG MT LOZG: 1.0000 | LOZENGE | OROMUCOSAL | Status: DC | PRN

## 2011-11-18 MED ORDER — METOCLOPRAMIDE HCL 5 MG/ML IJ SOLN: 10.0000 mg | Freq: Once | INTRAMUSCULAR | Status: DC | PRN

## 2011-11-18 MED ORDER — LACTATED RINGERS IV SOLN
INTRAVENOUS | Status: DC
  Administered 2011-11-18: 11:00:00 via INTRAVENOUS

## 2011-11-18 MED ORDER — 0.9 % SODIUM CHLORIDE (POUR BTL) OPTIME
TOPICAL | Status: DC | PRN
  Administered 2011-11-18: 1000 mL

## 2011-11-18 MED ORDER — ROCURONIUM BROMIDE 100 MG/10ML IV SOLN
INTRAVENOUS | Status: DC | PRN
  Administered 2011-11-18: 40 mg via INTRAVENOUS

## 2011-11-18 MED ORDER — FENTANYL CITRATE 0.05 MG/ML IJ SOLN: 25.0000 ug | INTRAMUSCULAR | Status: DC | PRN

## 2011-11-18 MED ORDER — SODIUM CHLORIDE 0.9 % IJ SOLN: 3.0000 mL | Freq: Two times a day (BID) | INTRAMUSCULAR | Status: DC

## 2011-11-18 MED ORDER — ZOLPIDEM TARTRATE 10 MG PO TABS: 10.0000 mg | ORAL_TABLET | Freq: Every evening | ORAL | Status: DC | PRN

## 2011-11-18 MED ORDER — FENTANYL CITRATE 0.05 MG/ML IJ SOLN
INTRAMUSCULAR | Status: DC | PRN
  Administered 2011-11-18 (×4): 50 ug via INTRAVENOUS
  Administered 2011-11-18: 100 ug via INTRAVENOUS

## 2011-11-18 MED ORDER — FLUTICASONE PROPIONATE 50 MCG/ACT NA SUSP
2.0000 | Freq: Every day | NASAL | Status: DC
  Administered 2011-11-19: 2 via NASAL
  Filled 2011-11-18: qty 16

## 2011-11-18 MED ORDER — LOSARTAN POTASSIUM 50 MG PO TABS
50.0000 mg | ORAL_TABLET | Freq: Every day | ORAL | Status: DC
  Administered 2011-11-19: 50 mg via ORAL
  Filled 2011-11-18: qty 1

## 2011-11-18 MED ORDER — DEXAMETHASONE SODIUM PHOSPHATE 4 MG/ML IJ SOLN
4.0000 mg | Freq: Four times a day (QID) | INTRAMUSCULAR | Status: DC
  Administered 2011-11-18: 4 mg via INTRAVENOUS
  Filled 2011-11-18 (×8): qty 1

## 2011-11-18 MED ORDER — ACETAMINOPHEN 10 MG/ML IV SOLN
1000.0000 mg | Freq: Once | INTRAVENOUS | Status: AC
  Administered 2011-11-18: 1000 mg via INTRAVENOUS
  Filled 2011-11-18: qty 100

## 2011-11-18 MED ORDER — SODIUM CHLORIDE 0.9 % IJ SOLN: 3.0000 mL | INTRAMUSCULAR | Status: DC | PRN

## 2011-11-18 MED ORDER — ONDANSETRON HCL 4 MG/2ML IJ SOLN
INTRAMUSCULAR | Status: DC | PRN
  Administered 2011-11-18: 4 mg via INTRAVENOUS

## 2011-11-18 MED ORDER — HEMOSTATIC AGENTS (NO CHARGE) OPTIME
TOPICAL | Status: DC | PRN
  Administered 2011-11-18: 1 via TOPICAL

## 2011-11-18 MED ORDER — ALBUTEROL SULFATE HFA 108 (90 BASE) MCG/ACT IN AERS
2.0000 | INHALATION_SPRAY | Freq: Four times a day (QID) | RESPIRATORY_TRACT | Status: DC | PRN
Start: 2011-11-18 — End: 2011-11-19
  Filled 2011-11-18: qty 6.7

## 2011-11-18 MED ORDER — ONDANSETRON HCL 4 MG/2ML IJ SOLN: 4.0000 mg | INTRAMUSCULAR | Status: DC | PRN

## 2011-11-18 MED ORDER — MIDAZOLAM HCL 5 MG/5ML IJ SOLN
INTRAMUSCULAR | Status: DC | PRN
  Administered 2011-11-18: 1 mg via INTRAVENOUS

## 2011-11-18 MED ORDER — DEXAMETHASONE SODIUM PHOSPHATE 4 MG/ML IJ SOLN
8.0000 mg | Freq: Once | INTRAMUSCULAR | Status: AC
  Administered 2011-11-18: 8 mg via INTRAVENOUS
  Filled 2011-11-18: qty 2

## 2011-11-18 MED ORDER — OXYCODONE HCL 5 MG PO TABS
10.0000 mg | ORAL_TABLET | ORAL | Status: DC | PRN
  Administered 2011-11-18 – 2011-11-19 (×3): 10 mg via ORAL
  Filled 2011-11-18 (×3): qty 2

## 2011-11-18 MED ORDER — BUPROPION HCL ER (XL) 150 MG PO TB24
150.0000 mg | ORAL_TABLET | Freq: Every day | ORAL | Status: DC
  Administered 2011-11-19: 150 mg via ORAL
  Filled 2011-11-18 (×2): qty 1

## 2011-11-18 MED ORDER — DEXAMETHASONE 4 MG PO TABS
4.0000 mg | ORAL_TABLET | Freq: Four times a day (QID) | ORAL | Status: DC
  Administered 2011-11-18 – 2011-11-19 (×3): 4 mg via ORAL
  Filled 2011-11-18 (×9): qty 1

## 2011-11-18 MED ORDER — HYDROMORPHONE HCL PF 1 MG/ML IJ SOLN
INTRAMUSCULAR | Status: AC
  Filled 2011-11-18: qty 1

## 2011-11-18 MED ORDER — NEOSTIGMINE METHYLSULFATE 1 MG/ML IJ SOLN
INTRAMUSCULAR | Status: DC | PRN
  Administered 2011-11-18: 3 mg via INTRAVENOUS

## 2011-11-18 MED ORDER — METHOCARBAMOL 500 MG PO TABS
500.0000 mg | ORAL_TABLET | Freq: Four times a day (QID) | ORAL | Status: DC | PRN
  Administered 2011-11-18 (×2): 500 mg via ORAL
  Filled 2011-11-18 (×2): qty 1

## 2011-11-18 MED ORDER — ACETAMINOPHEN 10 MG/ML IV SOLN
1000.0000 mg | Freq: Four times a day (QID) | INTRAVENOUS | Status: AC
  Administered 2011-11-18 – 2011-11-19 (×4): 1000 mg via INTRAVENOUS
  Filled 2011-11-18 (×4): qty 100

## 2011-11-18 MED ORDER — SODIUM CHLORIDE 0.9 % IV SOLN
250.0000 mL | INTRAVENOUS | Status: DC
  Administered 2011-11-18: 250 mL via INTRAVENOUS

## 2011-11-18 MED ORDER — PROPOFOL 10 MG/ML IV EMUL
INTRAVENOUS | Status: DC | PRN
  Administered 2011-11-18 (×2): 100 mg via INTRAVENOUS

## 2011-11-18 MED ORDER — PHENOL 1.4 % MT LIQD
1.0000 | OROMUCOSAL | Status: DC | PRN
  Filled 2011-11-18: qty 177

## 2011-11-18 MED ORDER — ACETAMINOPHEN 10 MG/ML IV SOLN
INTRAVENOUS | Status: AC
  Filled 2011-11-18: qty 100

## 2011-11-18 MED ORDER — GLYCOPYRROLATE 0.2 MG/ML IJ SOLN
INTRAMUSCULAR | Status: DC | PRN
  Administered 2011-11-18: .5 mg via INTRAVENOUS

## 2011-11-18 MED ORDER — MORPHINE SULFATE 4 MG/ML IJ SOLN: 1.0000 mg | INTRAMUSCULAR | Status: DC | PRN

## 2011-11-18 MED ORDER — ONDANSETRON HCL 4 MG/2ML IJ SOLN: 4.0000 mg | Freq: Once | INTRAMUSCULAR | Status: DC | PRN

## 2011-11-18 MED ORDER — THROMBIN 20000 UNITS EX KIT
PACK | CUTANEOUS | Status: DC | PRN
  Administered 2011-11-18: 09:00:00 via TOPICAL

## 2011-11-18 MED ORDER — METHOCARBAMOL 100 MG/ML IJ SOLN
500.0000 mg | INTRAVENOUS | Status: AC
  Administered 2011-11-18: 500 mg via INTRAVENOUS
  Filled 2011-11-18: qty 5

## 2011-11-18 MED ORDER — BUPIVACAINE-EPINEPHRINE 0.25% -1:200000 IJ SOLN
INTRAMUSCULAR | Status: DC | PRN
  Administered 2011-11-18: 10 mL

## 2011-11-18 MED ORDER — LACTATED RINGERS IV SOLN
INTRAVENOUS | Status: DC | PRN
  Administered 2011-11-18: 07:00:00 via INTRAVENOUS

## 2011-11-18 MED ORDER — HYDROMORPHONE HCL PF 1 MG/ML IJ SOLN
0.2500 mg | INTRAMUSCULAR | Status: DC | PRN
  Administered 2011-11-18 (×4): 0.5 mg via INTRAVENOUS

## 2011-11-18 SURGICAL SUPPLY — 55 items
BAG ISOLATION DRAPE 18X18 (DRAPES) ×1 IMPLANT
CANISTER SUCTION 2500CC (MISCELLANEOUS) ×2 IMPLANT
CHANNEL EON MINI 16 IPG (Orthopedic Implant) ×2 IMPLANT
CLOTH BEACON ORANGE TIMEOUT ST (SAFETY) ×2 IMPLANT
CORDS BIPOLAR (ELECTRODE) ×2 IMPLANT
DRAPE C-ARM 42X72 X-RAY (DRAPES) ×2 IMPLANT
DRAPE INCISE IOBAN 85X60 (DRAPES) ×2 IMPLANT
DRAPE ISOLATION BAG 18X18 (DRAPES) ×1
DRAPE SURG 17X23 STRL (DRAPES) ×2 IMPLANT
DRAPE U-SHAPE 47X51 STRL (DRAPES) ×2 IMPLANT
DRSG MEPILEX BORDER 4X4 (GAUZE/BANDAGES/DRESSINGS) ×2 IMPLANT
DRSG MEPILEX BORDER 4X8 (GAUZE/BANDAGES/DRESSINGS) ×2 IMPLANT
DURAPREP 26ML APPLICATOR (WOUND CARE) ×2 IMPLANT
ELECT CAUTERY BLADE 6.4 (BLADE) ×2 IMPLANT
ELECT REM PT RETURN 9FT ADLT (ELECTROSURGICAL) ×2
ELECTRODE REM PT RTRN 9FT ADLT (ELECTROSURGICAL) ×1 IMPLANT
EON MINI LE CHARGING SYSTEM ×2 IMPLANT
GLOVE BIOGEL PI IND STRL 6.5 (GLOVE) ×1 IMPLANT
GLOVE BIOGEL PI IND STRL 8.5 (GLOVE) ×1 IMPLANT
GLOVE BIOGEL PI INDICATOR 6.5 (GLOVE) ×1
GLOVE BIOGEL PI INDICATOR 8.5 (GLOVE) ×1
GLOVE ECLIPSE 6.0 STRL STRAW (GLOVE) ×2 IMPLANT
GLOVE ECLIPSE 8.5 STRL (GLOVE) ×4 IMPLANT
GOWN PREVENTION PLUS XXLARGE (GOWN DISPOSABLE) ×2 IMPLANT
GOWN STRL NON-REIN LRG LVL3 (GOWN DISPOSABLE) ×4 IMPLANT
KIT BASIN OR (CUSTOM PROCEDURE TRAY) ×2 IMPLANT
KIT ROOM TURNOVER OR (KITS) ×2 IMPLANT
LAMI NARROW PRIPOLE 16CH (Orthopedic Implant) ×2 IMPLANT
NDL SUT 6 .5 CRC .975X.05 MAYO (NEEDLE) ×1 IMPLANT
NEEDLE 22X1 1/2 (OR ONLY) (NEEDLE) ×2 IMPLANT
NEEDLE MAYO TAPER (NEEDLE) ×1
NEEDLE SPNL 18GX3.5 QUINCKE PK (NEEDLE) ×6 IMPLANT
NS IRRIG 1000ML POUR BTL (IV SOLUTION) ×2 IMPLANT
PACK LAMINECTOMY ORTHO (CUSTOM PROCEDURE TRAY) ×2 IMPLANT
PACK UNIVERSAL I (CUSTOM PROCEDURE TRAY) ×2 IMPLANT
PAD ARMBOARD 7.5X6 YLW CONV (MISCELLANEOUS) ×4 IMPLANT
PROGRAMMER EON PATIENT (MISCELLANEOUS) ×2 IMPLANT
SPONGE LAP 4X18 X RAY DECT (DISPOSABLE) ×2 IMPLANT
SPONGE SURGIFOAM ABS GEL 100 (HEMOSTASIS) ×2 IMPLANT
STAPLER VISISTAT 35W (STAPLE) ×2 IMPLANT
STRIP CLOSURE SKIN 1/2X4 (GAUZE/BANDAGES/DRESSINGS) ×2 IMPLANT
SURGIFLO TRUKIT (HEMOSTASIS) IMPLANT
SUT FIBERWIRE #2 38 REV NDL BL (SUTURE) ×2
SUT MNCRL AB 3-0 PS2 18 (SUTURE) ×4 IMPLANT
SUT VIC AB 1 CT1 27 (SUTURE) ×3
SUT VIC AB 1 CT1 27XBRD ANBCTR (SUTURE) ×3 IMPLANT
SUT VIC AB 2-0 CT1 18 (SUTURE) ×2 IMPLANT
SUTURE FIBERWR#2 38 REV NDL BL (SUTURE) ×1 IMPLANT
SYR BULB IRRIGATION 50ML (SYRINGE) ×2 IMPLANT
SYR CONTROL 10ML LL (SYRINGE) ×2 IMPLANT
SYSTEM CHARGING EON MINI (Orthopedic Implant) ×2 IMPLANT
TOWEL OR 17X24 6PK STRL BLUE (TOWEL DISPOSABLE) ×2 IMPLANT
TOWEL OR 17X26 10 PK STRL BLUE (TOWEL DISPOSABLE) ×2 IMPLANT
TRAY FOLEY CATH 14FR (SET/KITS/TRAYS/PACK) IMPLANT
WATER STERILE IRR 1000ML POUR (IV SOLUTION) ×2 IMPLANT

## 2011-11-18 NOTE — Anesthesia Preprocedure Evaluation (Addendum)
Anesthesia Evaluation  Patient identified by MRN, date of birth, ID band Patient awake    Reviewed: Allergy & Precautions, H&P , NPO status , Patient's Chart, lab work & pertinent test results  Airway Mallampati: II TM Distance: >3 FB Neck ROM: full    Dental  (+) Partial Lower   Pulmonary asthma ,  clear to auscultation  Pulmonary exam normal       Cardiovascular hypertension, regular Normal    Neuro/Psych PSYCHIATRIC DISORDERS Depression  Neuromuscular disease    GI/Hepatic negative GI ROS, Neg liver ROS,   Endo/Other  Negative Endocrine ROS  Renal/GU negative Renal ROS  Genitourinary negative   Musculoskeletal  (+) Arthritis -, Osteoarthritis,    Abdominal Normal abdominal exam  (+)   Peds  Hematology negative hematology ROS (+)   Anesthesia Other Findings   Reproductive/Obstetrics negative OB ROS                           Anesthesia Physical Anesthesia Plan  ASA: II  Anesthesia Plan:    Post-op Pain Management:    Induction:   Airway Management Planned:   Additional Equipment:   Intra-op Plan:   Post-operative Plan:   Informed Consent: I have reviewed the patients History and Physical, chart, labs and discussed the procedure including the risks, benefits and alternatives for the proposed anesthesia with the patient or authorized representative who has indicated his/her understanding and acceptance.   Dental Advisory Given  Plan Discussed with: Anesthesiologist, Surgeon and CRNA  Anesthesia Plan Comments:         Anesthesia Quick Evaluation

## 2011-11-18 NOTE — Transfer of Care (Signed)
Immediate Anesthesia Transfer of Care Note  Patient: Brianna Jacobs  Procedure(s) Performed:  LUMBAR SPINAL CORD STIMULATOR INSERTION - Thoracic 9 laminiotomy, SPINAL CORD STIMULATOR PLACEMENT thoracic 9- Thoracic 7  Patient Location: PACU  Anesthesia Type: General  Level of Consciousness: sedated and patient cooperative  Airway & Oxygen Therapy: Patient Spontanous Breathing and Patient connected to nasal cannula oxygen  Post-op Assessment: Report given to PACU RN, Post -op Vital signs reviewed and stable and Patient moving all extremities X 4  Post vital signs: Reviewed and stable  Complications: No apparent anesthesia complications

## 2011-11-18 NOTE — Brief Op Note (Signed)
11/18/2011  9:30 AM  PATIENT:  Brianna Jacobs  78 y.o. female  PRE-OPERATIVE DIAGNOSIS:  CHRONIC PAIN  POST-OPERATIVE DIAGNOSIS:  Chronic pain  PROCEDURE:  Procedure(s): LUMBAR SPINAL CORD STIMULATOR INSERTION  SURGEON:  Surgeon(s): Alvy Beal, MD  PHYSICIAN ASSISTANT:   ASSISTANTS: Norval Gable   ANESTHESIA:   general  EBL:  Total I/O In: 700 [I.V.:700] Out: -   BLOOD ADMINISTERED:none  DRAINS: none   LOCAL MEDICATIONS USED:  MARCAINE 10CC  SPECIMEN:  No Specimen  DISPOSITION OF SPECIMEN:  N/A  COUNTS:  YES  TOURNIQUET:  * No tourniquets in log *  DICTATION: .Other Dictation: Dictation Number [id number]  PLAN OF CARE: Admit for overnight observation  PATIENT DISPOSITION:  PACU - hemodynamically stable.

## 2011-11-18 NOTE — H&P (Signed)
No change in clinical exam Chronic pain Plan on SCS placement

## 2011-11-18 NOTE — Anesthesia Postprocedure Evaluation (Signed)
  Anesthesia Post-op Note  Patient: Brianna Jacobs  Procedure(s) Performed:  LUMBAR SPINAL CORD STIMULATOR INSERTION - Thoracic 9 laminiotomy, SPINAL CORD STIMULATOR PLACEMENT thoracic 9- Thoracic 7  Patient Location: PACU  Anesthesia Type: General  Level of Consciousness: awake, alert  and oriented  Airway and Oxygen Therapy: Patient Spontanous Breathing  Post-op Pain: mild  Post-op Assessment: Post-op Vital signs reviewed, Patient's Cardiovascular Status Stable, Respiratory Function Stable, Patent Airway, No signs of Nausea or vomiting and Pain level controlled  Post-op Vital Signs: Reviewed and stable  Complications: No apparent anesthesia complications

## 2011-11-19 ENCOUNTER — Encounter (HOSPITAL_COMMUNITY): Payer: Self-pay | Admitting: Orthopedic Surgery

## 2011-11-19 MED ORDER — POLYETHYLENE GLYCOL 3350 17 G PO PACK: 17.0000 g | PACK | Freq: Every day | ORAL | Status: AC

## 2011-11-19 MED ORDER — METHOCARBAMOL 500 MG PO TABS: 500.0000 mg | ORAL_TABLET | Freq: Three times a day (TID) | ORAL | Status: AC

## 2011-11-19 MED ORDER — ONDANSETRON HCL 4 MG PO TABS: 4.0000 mg | ORAL_TABLET | Freq: Three times a day (TID) | ORAL | Status: AC | PRN

## 2011-11-19 NOTE — Evaluation (Signed)
Physical Therapy Evaluation/Discharge Patient Details Name: Brianna Jacobs MRN: [id number] DOB: 1929/10/25 Today's Date: 11/19/2011  Problem List: There is no problem list on file for this patient.   Past Medical History:  Past Medical History  Diagnosis Date  . Asthma     occassional usage of inhalers  . Seasonal allergies   . Depression   . ADD (attention deficit disorder)   . Hypertension   . Cancer     colon cancer, 16 yrs ago  . Bowel obstruction   . Neuromuscular disorder     neuropathy  . Arthritis   . Spinal stenosis   . Scoliosis    Past Surgical History:  Past Surgical History  Procedure Date  . Sigmoid resection / rectopexy   . Colonoscopy     2 yrs ago  . Eye surgery     cataracts bilat  . Joint replacement     bilat shoulders  . Fracture surgery     thoracic spine  . Abdominal hysterectomy 1972    partial    PT Assessment/Plan/Recommendation PT Assessment Clinical Impression Statement: Pt. is a 78 y/o female s/p spinal cord stimulator placed at T9.  Pt. mobilizing well and will have necessary caregiver assistance at d/c.  All education completed with no further PT needs. PT Recommendation/Assessment: Patent does not need any further PT services No Skilled PT: All education completed;Patient at baseline level of functioning;Patient will have necessary level of assist by caregiver at discharge;Patient is supervision for all activity/mobility PT Recommendation Follow Up Recommendations: No PT follow up Equipment Recommended: None recommended by PT PT Goals  Acute Rehab PT Goals PT Goal Formulation:  (no goals set)  PT Evaluation Precautions/Restrictions  Precautions Precautions: Back Restrictions Weight Bearing Restrictions: No Prior Functioning  Home Living Lives With: Spouse Receives Help From: Family Type of Home: House Home Layout: Two level;Able to live on main level with bedroom/bathroom;Full bath on main level Alternate Level  Stairs-Rails: None (has chair lift) Home Access: Stairs to enter Entrance Stairs-Rails: None Entrance Stairs-Number of Steps: 1 Bathroom Shower/Tub: Walk-in shower;Door [employer] Toilet: Standard Bathroom Accessibility: Yes How Accessible: Accessible via walker Home Adaptive Equipment: Bedside commode/3-in-1;Straight cane;Walker - rolling Prior Function Level of Independence: Independent with basic ADLs;Independent with homemaking with ambulation;Independent with gait;Independent with transfers Driving: No Vocation: Retired Leisure: Hobbies-yes (Comment) Comments: enjoys walking Cognition Cognition Arousal/Alertness: Awake/alert Overall Cognitive Status: Appears within functional limits for tasks assessed Orientation Level: Oriented X4 Sensation/Coordination Sensation Light Touch: Appears Intact Stereognosis: Appears Intact Hot/Cold: Appears Intact Proprioception: Appears Intact Coordination Gross Motor Movements are Fluid and Coordinated: Yes Fine Motor Movements are Fluid and Coordinated: Yes Extremity Assessment RUE Assessment RUE Assessment: Within Functional Limits LUE Assessment LUE Assessment: Within Functional Limits RLE Assessment RLE Assessment: Within Functional Limits LLE Assessment LLE Assessment: Within Functional Limits Mobility (including Balance) Bed Mobility Bed Mobility: Yes Left Sidelying to Sit: 5: Supervision Left Sidelying to Sit Details (indicate cue type and reason): VCs for safe technique Sit to Sidelying Left: 5: Supervision;HOB flat Sit to Sidelying Left Details (indicate cue type and reason): VCs for safe technique Transfers Transfers: Yes Sit to Stand: 7: Independent;From bed Stand to Sit: 7: Independent;To bed;To chair/3-in-1 Ambulation/Gait Ambulation/Gait: Yes Ambulation/Gait Assistance: 5: Supervision Ambulation Distance (Feet): 200 Feet Assistive device: None Gait Pattern: Step-through pattern;Within Functional Limits Stairs:  Yes Stairs Assistance: 5: Supervision Stair Management Technique: No rails Number of Stairs: 1  Height of Stairs: 6  Wheelchair Mobility Wheelchair Mobility: No  Posture/Postural Control Posture/Postural  Control: No significant limitations Balance Balance Assessed: No Exercise    End of Session PT - End of Session Equipment Utilized During Treatment: Gait belt Activity Tolerance: Patient tolerated treatment well Patient left: in chair;with call bell in reach Nurse Communication: Mobility status for transfers;Mobility status for ambulation General Behavior During Session: [facility] for tasks performed Cognition: [facility] for tasks performed  Feltis, Nicki Reaper 11/19/2011, 8:37 AM  Nicki Reaper. Feltis, PT, DPT [phone]

## 2011-11-19 NOTE — Op Note (Signed)
NAMELANITRA, BATTAGLINI NO.:  [id number]  MEDICAL RECORD NO.:  [id number]  LOCATION:  5037                         FACILITY:  [facility]  PHYSICIAN:  Alvy Beal, MD    DATE OF BIRTH:  01-02-1930  DATE OF PROCEDURE:  11/18/2011 DATE OF DISCHARGE:                              OPERATIVE REPORT   PREOPERATIVE DIAGNOSIS:  Chronic back pain, bilateral leg pain (failed back syndrome).  POSTOPERATIVE DIAGNOSIS:  Chronic back pain, bilateral leg pain (failed back syndrome).  OPERATIVE PROCEDURES:  Implantation of spinal cord stimulator from T9 laminotomy and implantation of battery, separate incision.  COMPLICATIONS:  None.  CONDITION:  Stable.  FIRST ASSISTANT:  Norval Gable, [place].  HISTORY:  This is a very pleasant 78 year old woman who has been having chronic debilitating back, buttock, and bilateral leg pain.  Attempts at conservative management have failed to alleviate her symptoms.  She ultimately had a spinal cord stimulator trial done by my partner, Dr. Ethelene Hal and had an excellent response.  Given the positive response, she presented to me for definitive implantation.  All appropriate risks, benefits, and alternatives of surgery were discussed with the patient and consent was obtained.  OPERATIVE NOTE:  The patient was brought to the operating room, placed supine on the operating table.  After successful induction of general anesthesia and endotracheal intubation, TEDs and SCDs were applied.  The patient was turned prone onto a Wilson frame and all bony prominences were well padded.  Appropriate time-out was then done to confirm patient, procedure, and all other pertinent important data.  Once this was completed, I used x-ray to identify the L5 vertebral body.  I then counted up from L5 in the lateral plane until I identified the T9 vertebral body.  Because of her scoliosis, it was difficult to confirm this in the AP.  Once I had the appropriate level, I  infiltrated the skin and made an incision starting at the superior aspect of T9 and proceeded to inferior aspect of T10.  Sharp dissection was carried out down to the deep fascia.  Deep fascia was sharply incised and I carefully used a Cobb elevator to strip the paraspinal muscles to expose the spinous process and lamina of T9 and a portion of T10.  Once I had this completed, I then checked again in the lateral plane, counting up from L5 to confirm that this was the T9 level.  Once I checked this again, I then used a double-action Leksell rongeur to remove the majority of the T9 spinous process.  I then used a fine nerve hook to develop a plane underneath the lamina.  I then used a 2 and 3 mm Kerrison rongeur to perform a generous laminotomy of T9.  I then used Penfield 4 to dissect through the central raphe of the ligamentum flavum and then a 2 mm Kerrison to remove this ligamentum flavum and exposed the dorsal surface of the thecal sac.  Once this was exposed, I used a dural elevator to develop a plane underneath the remaining portion of T9.  I then passed the trial device superiorly and it went without any resistance.  I then obtained  a St. Jude 16 lead, 60 cm length Lamitrode Tripole stimulator and gently advanced it to the appropriate resting place.  X-ray confirmed that it was at the inferior aspect of T7, completely sparing the T8 vertebral body and was also in the midline. This corresponded to where they were using the trial most effectively. I then placed a FiberWire with a suture through the spinous process of T10 and then secured the leads to either side of the T10 spinous process with FiberWire.  Once they were secured, I then made a defect in the T10- 11 interspinous process ligament and then looped the leads around the T10 spinous process.  I then made a second incision on the right side where I had measured out preoperatively and dissected down 2.5 cm and created a pocket.   I then used the submuscular passing device to pass the leads from the thoracic wound down to the battery site.  I then secured it to the battery and torqued down the leads, placed into the pocket and secured it with # 1 Vicryl sutures.  We then tested the battery and all leads were functioning appropriately.  Both wounds were then copiously irrigated with normal saline. Hemostasis was obtained using bipolar electrocautery, maintained with thrombin-soaked Gelfoam patty.  I then closed both wounds, deep layers with #1 Vicryl suture, superficial with 2-0 Vicryl suture, and a 3-0 Monocryl for the skin.  Steri-Strips, dry dressing were applied.  The patient was extubated, transferred to the PACU without incident.  At the end of the case, all needle and sponge counts were correct.  There were no adverse intraoperative events.     Alvy Beal, MD     DDB/MEDQ  D:  11/18/2011  T:  11/19/2011  Job:  [id number]

## 2011-11-19 NOTE — Progress Notes (Signed)
CARE MANAGEMENT NOTE 11/19/2011  Patient:  Brianna Jacobs, Brianna Jacobs   Account Number:  [id number]  Date Initiated:  11/19/2011  Documentation initiated by:  Vance Peper  Subjective/Objective Assessment:   78 yr old female s/p spinal cord stimulator insertion.     Action/Plan:   No HH needs Identified.   DC Planning Services CM consult Status of service:  Completed, signed off Discharge Disposition:  HOME/SELF CARE

## 2011-11-19 NOTE — Progress Notes (Signed)
Referred to this CSW today for ?SNF. Chart reviewed and have spoken with RNCM and Care Coordinator who indicate patient plans to d/c home with HH and DME. CSW to sign off- please contact us if SW needs arise. Dujuan Stankowski, MSW, LCSWA [phone]   

## 2011-11-19 NOTE — Discharge Summary (Signed)
Patient ID: Brianna Jacobs MRN: [id number] DOB/AGE: [birth year] 78 y.o.  Admit date: 11/18/2011 Discharge date: 11/19/2011  Admission Diagnoses:  Chronic lumbar pain  Discharge Diagnoses:  Chronic lumbar pain status post permanent placement of SCS  Past Medical History  Diagnosis Date  . Asthma     occassional usage of inhalers  . Seasonal allergies   . Depression   . ADD (attention deficit disorder)   . Hypertension   . Cancer     colon cancer, 16 yrs ago  . Bowel obstruction   . Neuromuscular disorder     neuropathy  . Arthritis   . Spinal stenosis   . Scoliosis     Surgeries: Procedure(s): LUMBAR SPINAL CORD STIMULATOR INSERTION on 11/18/2011   Consultants:  none  Discharged Condition: Improved  Hospital Course: Brianna Jacobs is an 78 y.o. female who was admitted 11/18/2011 for operative treatment of chronic lumabr pain. Patient failed conservative treatments (please see the history and physical for the specifics) and had severe unremitting pain that affects sleep, daily activities, and work/hobbies. After pre-op clearance the patient was taken to the operating room on 11/18/2011 and underwent  Procedure(s): LUMBAR SPINAL CORD STIMULATOR INSERTION.    Patient was given perioperative antibiotics: Anti-infectives     Start     Dose/Rate Route Frequency Ordered Stop   11/18/11 1530   ceFAZolin (ANCEF) IVPB 1 g/50 mL premix        1 g 100 mL/hr over 30 Minutes Intravenous Every 8 hours 11/18/11 1250 11/18/11 2356   11/17/11 1515   ceFAZolin (ANCEF) IVPB 2 g/50 mL premix        2 g 100 mL/hr over 30 Minutes Intravenous 60 min pre-op 11/17/11 1502 11/18/11 0734           Patient was given sequential compression devices and early ambulation to prevent DVT.   Patient benefited maximally from hospital stay and there were no complications. At the time of discharge, the patient was moving their bowels without difficulty, tolerating a regular diet, pain is controlled with oral  pain medications and they have been cleared by PT/OT.   Recent vital signs: Patient Vitals for the past 24 hrs:  BP Temp Pulse Resp SpO2  11/19/11 0630 116/60 mmHg 98.9 F (37.2 C) 55  16  98 %  11/18/11 2112 147/69 mmHg 98.6 F (37 C) 68  18  95 %  11/18/11 1445 121/49 mmHg 97.7 F (36.5 C) 77  16  98 %  11/18/11 1159 113/43 mmHg - 56  14  100 %  11/18/11 1145 129/66 mmHg 98.3 F (36.8 C) 48  11  98 %  11/18/11 1130 107/39 mmHg - 57  15  100 %  11/18/11 1115 100/38 mmHg - 50  10  100 %  11/18/11 1100 109/38 mmHg - 48  8  100 %  11/18/11 1045 117/46 mmHg - 48  12  99 %  11/18/11 1030 147/53 mmHg - 49  13  100 %  11/18/11 1015 146/56 mmHg - 55  14  100 %  11/18/11 1000 128/53 mmHg 97.9 F (36.6 C) 60  12  100 %     Recent laboratory studies: No results found for this basename: WBC:2,HGB:2,HCT:2,PLT:2,NA:2,K:2,CL:2,CO2:2,BUN:2,CREATININE:2,GLUCOSE:2,PT:2,INR:2,CALCIUM,2: in the last 72 hours   Discharge Medications:   Medication List  As of 11/19/2011  7:55 AM   STOP taking these medications         oxyCODONE 20 MG 12 hr tablet  oxyCODONE-acetaminophen 10-325 MG per tablet         TAKE these medications         albuterol 108 (90 BASE) MCG/ACT inhaler   Commonly known as: PROVENTIL HFA;VENTOLIN HFA   Inhale 2 puffs into the lungs every 6 (six) hours as needed. Shortness of breath      aspirin EC 81 MG tablet   Take 81 mg by mouth every other day.      buPROPion 150 MG 24 hr tablet   Commonly known as: WELLBUTRIN XL   Take 150 mg by mouth daily.      Calcium + D 600-200 MG-UNIT Tabs   Generic drug: Calcium Carbonate-Vitamin D   Take 1 tablet by mouth daily.      cetirizine 10 MG tablet   Commonly known as: ZYRTEC   Take 10 mg by mouth daily.      Fish Oil 600 MG Caps   Take 1 capsule by mouth daily.      fluticasone 50 MCG/ACT nasal spray   Commonly known as: FLONASE   Place 2 sprays into the nose daily.      gabapentin 100 MG capsule   Commonly known  as: NEURONTIN   Take 100 mg by mouth 3 (three) times daily.      losartan 50 MG tablet   Commonly known as: COZAAR   Take 50 mg by mouth daily.      Melatonin 10 MG Tabs   Take 1 tablet by mouth at bedtime.      methocarbamol 500 MG tablet   Commonly known as: ROBAXIN   Take 1 tablet (500 mg total) by mouth 3 (three) times daily. MAX 3 pills daily      mulitivitamin with minerals Tabs   Take 1 tablet by mouth daily.      ondansetron 4 MG tablet   Commonly known as: ZOFRAN   Take 1 tablet (4 mg total) by mouth every 8 (eight) hours as needed for nausea. MAX 3 pills daily      PATADAY 0.2 % Soln   Generic drug: Olopatadine HCl   Apply 1 drop to eye daily.      polyethylene glycol packet   Commonly known as: MIRALAX / GLYCOLAX   Take 17 g by mouth daily. Take 1 packet daily until bowels become regular      SYSTANE BALANCE OP   Apply 1 drop to eye as needed. For dry eyes            Diagnostic Studies: Dg Thoracic Spine 2 View  11/18/2011  *RADIOLOGY REPORT*  Clinical Data: 78 year old female undergoing thoracic spinal cord stimulator placement.  THORACIC SPINE - 2 VIEW  Comparison: Thoracic radiographs 11/15/2011.  Fluoroscopy time of 0.5 minutes was utilized.  Findings: Two intraoperative fluoroscopic views of the thoracic spine.  T10 and T12 compression fractures re-identified. On these images of spinal cord stimulator device projects over the posterior spinal canal from the mid T7 to the upper T9 level.  To electrical leads extend caudally on the right.  IMPRESSION: Spinal cord stimulator device centered at the T8 level.  Original Report Authenticated By: Harley Hallmark, M.D.   Dg Thoracic Spine 2 View  11/15/2011  *RADIOLOGY REPORT*  Clinical Data: Preoperative examination (spinal cord stimulator insertion)  THORACIC SPINE - 2 VIEW  Comparison: Chest radiograph - 03/31/2011; lumbar spine MRI - 02/06/2011  Findings:  There is a mild S-shaped scoliotic curvature of the inferior  thoracic/upper lumbar spine.  Redemonstrated is severe compression deformity of the T12 vertebral body with minimal retropulsion of the superior endplate of T12, grossly unchanged compared to prior lumbar spine MRI.  There is an age indeterminate moderate (approximately 25%) compression deformity of the superior endplate of the T10 vertebral body.  Limited visualization of adjacent thorax suggests mild tortuosity of the thoracic aorta.  Post shoulder replacement, incompletely evaluated. Vascular calcifications within the abdominal aorta.  IMPRESSION: 1.  Remote severe compression deformity of the T12 vertebral body with minimal retropulsion of the superior endplate of T12. 2.  Age indeterminate moderate (approximate 25%) compression deformity of the superior endplate of the T10 vertebral body.  Original Report Authenticated By: Waynard Reeds, M.D.    Discharge Orders    Future Orders Please Complete By Expires   Diet - low sodium heart healthy      Call MD / Call 911      Comments:   If you experience chest pain or shortness of breath, CALL 911 and be transported to the hospital emergency room.  If you develope a fever above 101 F, pus (white drainage) or increased drainage or redness at the wound, or calf pain, call your surgeon's office.   Constipation Prevention      Comments:   Drink plenty of fluids.  Use MiraLax (prescritpion provided) for constipation up to three time daily until you become regular.  You may also use a stool softener, such as Colace (over the counter) 100 mg twice a day.    Increase activity slowly as tolerated      Weight Bearing as taught in Physical Therapy      Comments:   Use a walker or crutches as instructed.   Discharge instructions      Comments:   Keep incision clean and dry.  Leave steri strips in place.  May shower 5 days from surgery; pat to dry following shower.  May redress with clean, dry dressing if you would like.  Do not apply any lotion/cream/ointment  to the incision.      Driving restrictions      Comments:   No driving for 2 weeks.  Dr Shon Baton will discuss addition driving restrictions at your first post-op visit in 2 weeks.      Lifting restrictions      Comments:   No lifting anything greater than 5 pounds.  DO NOT reach overhead (above shoulder height).  NO bending, stooping or squatting.  Dr. Shon Baton will discuss additional lifting restrictions at your first post-op visit in 2 weeks.       Follow-up Information    Follow up with Alvy Beal, MD in 2 weeks.   Contact information:   [facility] [address] [place] [address] [phone]          Discharge Plan:  discharge to Home   Disposition: Stable at time of discharge    Signed: Gwinda Maine 11/19/2011, 7:55 AM

## 2011-12-15 DIAGNOSIS — G894 Chronic pain syndrome: Secondary | ICD-10-CM | POA: Diagnosis not present

## 2012-01-27 DIAGNOSIS — G894 Chronic pain syndrome: Secondary | ICD-10-CM | POA: Diagnosis not present

## 2012-02-15 DIAGNOSIS — R197 Diarrhea, unspecified: Secondary | ICD-10-CM | POA: Diagnosis not present

## 2012-03-01 DIAGNOSIS — G894 Chronic pain syndrome: Secondary | ICD-10-CM | POA: Diagnosis not present

## 2012-03-02 DIAGNOSIS — Z803 Family history of malignant neoplasm of breast: Secondary | ICD-10-CM | POA: Diagnosis not present

## 2012-03-02 DIAGNOSIS — Z1231 Encounter for screening mammogram for malignant neoplasm of breast: Secondary | ICD-10-CM | POA: Diagnosis not present

## 2012-03-03 ENCOUNTER — Other Ambulatory Visit: Payer: Self-pay | Admitting: Physical Medicine and Rehabilitation

## 2012-03-03 DIAGNOSIS — M546 Pain in thoracic spine: Secondary | ICD-10-CM

## 2012-03-07 ENCOUNTER — Ambulatory Visit
Admission: RE | Admit: 2012-03-07 | Discharge: 2012-03-07 | Disposition: A | Discharge status: Home / Self Care | Payer: Medicare Other | Source: Ambulatory Visit | Attending: Physical Medicine and Rehabilitation | Admitting: Physical Medicine and Rehabilitation

## 2012-03-07 DIAGNOSIS — M546 Pain in thoracic spine: Secondary | ICD-10-CM | POA: Diagnosis not present

## 2012-03-07 DIAGNOSIS — M8448XA Pathological fracture, other site, initial encounter for fracture: Secondary | ICD-10-CM | POA: Diagnosis not present

## 2012-03-10 DIAGNOSIS — H524 Presbyopia: Secondary | ICD-10-CM | POA: Diagnosis not present

## 2012-03-10 DIAGNOSIS — H04129 Dry eye syndrome of unspecified lacrimal gland: Secondary | ICD-10-CM | POA: Diagnosis not present

## 2012-03-10 DIAGNOSIS — H52229 Regular astigmatism, unspecified eye: Secondary | ICD-10-CM | POA: Diagnosis not present

## 2012-03-17 ENCOUNTER — Other Ambulatory Visit (HOSPITAL_COMMUNITY): Payer: Self-pay | Admitting: Physical Medicine and Rehabilitation

## 2012-03-17 DIAGNOSIS — S22000A Wedge compression fracture of unspecified thoracic vertebra, initial encounter for closed fracture: Secondary | ICD-10-CM

## 2012-03-20 DIAGNOSIS — Z85038 Personal history of other malignant neoplasm of large intestine: Secondary | ICD-10-CM | POA: Diagnosis not present

## 2012-03-21 ENCOUNTER — Other Ambulatory Visit (HOSPITAL_COMMUNITY): Payer: Self-pay | Admitting: Interventional Radiology

## 2012-03-21 ENCOUNTER — Ambulatory Visit (HOSPITAL_COMMUNITY)
Admission: RE | Admit: 2012-03-21 | Discharge: 2012-03-21 | Disposition: A | Discharge status: Home / Self Care | Payer: Medicare Other | Source: Ambulatory Visit | Attending: Physical Medicine and Rehabilitation | Admitting: Physical Medicine and Rehabilitation

## 2012-03-21 DIAGNOSIS — IMO0002 Reserved for concepts with insufficient information to code with codable children: Secondary | ICD-10-CM

## 2012-03-21 DIAGNOSIS — M549 Dorsalgia, unspecified: Secondary | ICD-10-CM | POA: Diagnosis not present

## 2012-03-21 DIAGNOSIS — S22000A Wedge compression fracture of unspecified thoracic vertebra, initial encounter for closed fracture: Secondary | ICD-10-CM

## 2012-03-21 DIAGNOSIS — M8448XA Pathological fracture, other site, initial encounter for fracture: Secondary | ICD-10-CM | POA: Diagnosis not present

## 2012-03-28 ENCOUNTER — Encounter (HOSPITAL_COMMUNITY)
Admission: RE | Admit: 2012-03-28 | Discharge: 2012-03-28 | Disposition: A | Discharge status: Home / Self Care | Payer: Medicare Other | Source: Ambulatory Visit | Attending: Interventional Radiology | Admitting: Interventional Radiology

## 2012-03-28 ENCOUNTER — Ambulatory Visit (HOSPITAL_COMMUNITY): Payer: Medicare Other

## 2012-03-28 DIAGNOSIS — S22009A Unspecified fracture of unspecified thoracic vertebra, initial encounter for closed fracture: Secondary | ICD-10-CM | POA: Insufficient documentation

## 2012-03-28 DIAGNOSIS — I10 Essential (primary) hypertension: Secondary | ICD-10-CM | POA: Insufficient documentation

## 2012-03-28 DIAGNOSIS — M25559 Pain in unspecified hip: Secondary | ICD-10-CM | POA: Diagnosis not present

## 2012-03-28 DIAGNOSIS — M4120 Other idiopathic scoliosis, site unspecified: Secondary | ICD-10-CM | POA: Diagnosis not present

## 2012-03-28 DIAGNOSIS — M545 Low back pain: Secondary | ICD-10-CM | POA: Diagnosis not present

## 2012-03-28 DIAGNOSIS — Z96619 Presence of unspecified artificial shoulder joint: Secondary | ICD-10-CM | POA: Diagnosis not present

## 2012-03-28 DIAGNOSIS — X58XXXA Exposure to other specified factors, initial encounter: Secondary | ICD-10-CM | POA: Insufficient documentation

## 2012-03-28 DIAGNOSIS — Z85038 Personal history of other malignant neoplasm of large intestine: Secondary | ICD-10-CM | POA: Diagnosis not present

## 2012-03-28 DIAGNOSIS — M8448XA Pathological fracture, other site, initial encounter for fracture: Secondary | ICD-10-CM | POA: Diagnosis not present

## 2012-03-28 DIAGNOSIS — IMO0002 Reserved for concepts with insufficient information to code with codable children: Secondary | ICD-10-CM

## 2012-03-28 MED ORDER — TECHNETIUM TC 99M MEDRONATE IV KIT
25.0000 | PACK | Freq: Once | INTRAVENOUS | Status: AC | PRN
  Administered 2012-03-28: 25 via INTRAVENOUS

## 2012-03-29 ENCOUNTER — Other Ambulatory Visit: Payer: Self-pay | Admitting: Radiology

## 2012-03-29 ENCOUNTER — Other Ambulatory Visit (HOSPITAL_COMMUNITY): Payer: Self-pay | Admitting: Interventional Radiology

## 2012-03-29 DIAGNOSIS — IMO0002 Reserved for concepts with insufficient information to code with codable children: Secondary | ICD-10-CM

## 2012-03-30 ENCOUNTER — Telehealth (HOSPITAL_COMMUNITY): Payer: Self-pay

## 2012-03-30 NOTE — Telephone Encounter (Signed)
Mrs. Brianna Jacobs called wanting to know if the KP would effect her having a colonoscopy in a few weeks.  I responded by telling her no it will not per Dr. Corliss Skains

## 2012-03-31 ENCOUNTER — Encounter (HOSPITAL_COMMUNITY): Payer: Self-pay

## 2012-03-31 ENCOUNTER — Other Ambulatory Visit (HOSPITAL_COMMUNITY): Payer: Self-pay | Admitting: Interventional Radiology

## 2012-03-31 ENCOUNTER — Ambulatory Visit (HOSPITAL_COMMUNITY)
Admission: RE | Admit: 2012-03-31 | Discharge: 2012-03-31 | Disposition: A | Discharge status: Home / Self Care | Payer: Medicare Other | Source: Ambulatory Visit | Attending: Interventional Radiology | Admitting: Interventional Radiology

## 2012-03-31 DIAGNOSIS — IMO0002 Reserved for concepts with insufficient information to code with codable children: Secondary | ICD-10-CM

## 2012-03-31 DIAGNOSIS — Z85038 Personal history of other malignant neoplasm of large intestine: Secondary | ICD-10-CM | POA: Diagnosis not present

## 2012-03-31 DIAGNOSIS — M4800 Spinal stenosis, site unspecified: Secondary | ICD-10-CM | POA: Diagnosis not present

## 2012-03-31 DIAGNOSIS — M8448XA Pathological fracture, other site, initial encounter for fracture: Secondary | ICD-10-CM | POA: Diagnosis not present

## 2012-03-31 DIAGNOSIS — I10 Essential (primary) hypertension: Secondary | ICD-10-CM | POA: Insufficient documentation

## 2012-03-31 DIAGNOSIS — F988 Other specified behavioral and emotional disorders with onset usually occurring in childhood and adolescence: Secondary | ICD-10-CM | POA: Diagnosis not present

## 2012-03-31 LAB — CBC
HCT: 37.2 % (ref 36.0–46.0)
Hemoglobin: 12.7 g/dL (ref 12.0–15.0)
MCHC: 34.1 g/dL (ref 30.0–36.0)
MCV: 92.1 fL (ref 78.0–100.0)
RDW: 13.3 % (ref 11.5–15.5)

## 2012-03-31 LAB — BASIC METABOLIC PANEL
BUN: 18 mg/dL (ref 6–23)
CO2: 27 mEq/L (ref 19–32)
Chloride: 102 mEq/L (ref 96–112)
Creatinine, Ser: 0.82 mg/dL (ref 0.50–1.10)
GFR calc Af Amer: 76 mL/min — ABNORMAL LOW (ref >90)
Glucose, Bld: 84 mg/dL (ref 70–99)
Potassium: 3.9 mEq/L (ref 3.5–5.1)

## 2012-03-31 LAB — PROTIME-INR: INR: 1.01 (ref 0.00–1.49)

## 2012-03-31 MED ORDER — IOHEXOL 300 MG/ML  SOLN
50.0000 mL | Freq: Once | INTRAMUSCULAR | Status: AC | PRN
  Administered 2012-03-31: 3 mL via INTRAVENOUS

## 2012-03-31 MED ORDER — SODIUM CHLORIDE 0.9 % IV SOLN: INTRAVENOUS | Status: DC

## 2012-03-31 MED ORDER — CEFAZOLIN SODIUM 1-5 GM-% IV SOLN
1.0000 g | Freq: Once | INTRAVENOUS | Status: AC
  Administered 2012-03-31: 1 g via INTRAVENOUS

## 2012-03-31 MED ORDER — OXYCODONE-ACETAMINOPHEN 5-325 MG PO TABS
ORAL_TABLET | ORAL | Status: AC
  Filled 2012-03-31: qty 2

## 2012-03-31 MED ORDER — CEFAZOLIN SODIUM 1-5 GM-% IV SOLN
INTRAVENOUS | Status: AC
  Filled 2012-03-31: qty 50

## 2012-03-31 MED ORDER — HYDROMORPHONE HCL PF 1 MG/ML IJ SOLN
INTRAMUSCULAR | Status: AC
  Filled 2012-03-31: qty 2

## 2012-03-31 MED ORDER — MIDAZOLAM HCL 5 MG/5ML IJ SOLN
INTRAMUSCULAR | Status: AC | PRN
Start: 2012-03-31 — End: 2012-03-31
  Administered 2012-03-31 (×2): 1 mg via INTRAVENOUS

## 2012-03-31 MED ORDER — SODIUM BICARBONATE 4 % IV SOLN
INTRAVENOUS | Status: AC
  Filled 2012-03-31: qty 5

## 2012-03-31 MED ORDER — TOBRAMYCIN SULFATE 1.2 G IJ SOLR
INTRAMUSCULAR | Status: AC
  Filled 2012-03-31: qty 1.2

## 2012-03-31 MED ORDER — MIDAZOLAM HCL 2 MG/2ML IJ SOLN
INTRAMUSCULAR | Status: AC
  Filled 2012-03-31: qty 6

## 2012-03-31 MED ORDER — SODIUM CHLORIDE 0.9 % IV SOLN: INTRAVENOUS | Status: AC

## 2012-03-31 MED ORDER — OXYCODONE-ACETAMINOPHEN 5-325 MG PO TABS
2.0000 | ORAL_TABLET | Freq: Once | ORAL | Status: AC
  Administered 2012-03-31: 2 via ORAL

## 2012-03-31 MED ORDER — GELATIN ABSORBABLE 12-7 MM EX MISC
CUTANEOUS | Status: AC
  Filled 2012-03-31: qty 1

## 2012-03-31 MED ORDER — FENTANYL CITRATE 0.05 MG/ML IJ SOLN
INTRAMUSCULAR | Status: AC
  Filled 2012-03-31: qty 6

## 2012-03-31 MED ORDER — FENTANYL CITRATE 0.05 MG/ML IJ SOLN
INTRAMUSCULAR | Status: AC | PRN
  Administered 2012-03-31 (×2): 25 ug via INTRAVENOUS

## 2012-03-31 NOTE — H&P (Signed)
Brianna Jacobs is an 78 y.o. female.   Chief Complaint: chronic back pain for yrs; increase pain x 3 months Post neuro stimulator 11/2011- which helps leg pain Bone scan confirms fractures at Thoracic 9 and 10 Scheduled for vertebroplasty vs kyphoplasty HPI: spinal stenosis; HTN; colitis; ADD  Past Medical History  Diagnosis Date  . Asthma     occassional usage of inhalers  . Seasonal allergies   . Depression   . ADD (attention deficit disorder)   . Hypertension   . Cancer     colon cancer, 16 yrs ago  . Bowel obstruction   . Neuromuscular disorder     neuropathy  . Arthritis   . Spinal stenosis   . Scoliosis     Past Surgical History  Procedure Date  . Sigmoid resection / rectopexy   . Colonoscopy     2 yrs ago  . Eye surgery     cataracts bilat  . Joint replacement     bilat shoulders  . Fracture surgery     thoracic spine  . Abdominal hysterectomy 1972    partial  . Spinal cord stimulator insertion 11/18/2011    Procedure: LUMBAR SPINAL CORD STIMULATOR INSERTION;  Surgeon: Alvy Beal, MD;  Location: [facility] OR;  Service: Orthopedics;  Laterality: N/A;  Thoracic 9 laminiotomy, SPINAL CORD STIMULATOR PLACEMENT thoracic 9- Thoracic 7    No family history on file. Social History:  reports that she quit smoking about 34 years ago. She does not have any smokeless tobacco history on file. She reports that she uses illicit drugs (Oxycodone). She reports that she does not drink alcohol.  Allergies:  Allergies  Allergen Reactions  . Cymbalta (Duloxetine Hcl) Other (See Comments)    Makes me lethargic  . Lyrica (Pregabalin) Other (See Comments)    Makes me lethargic  . Celebrex (Celecoxib) Rash  . Naprosyn (Naproxen) Rash     (Not in a hospital admission)  No results found for this or any previous visit (from the past 48 hour(s)). No results found.  Review of Systems  Constitutional: Negative for fever.  HENT: Positive for neck pain.   Respiratory: Negative for  sputum production.   Cardiovascular: Negative for chest pain.  Gastrointestinal: Negative for nausea and vomiting.  Musculoskeletal: Positive for back pain and joint pain.  Neurological: Negative for headaches.    There were no vitals taken for this visit. Physical Exam  Constitutional: She is oriented to person, place, and time. She appears well-developed and well-nourished.  Cardiovascular: Normal rate, regular rhythm and normal heart sounds.   No murmur heard. Respiratory: Effort normal and breath sounds normal. She has no wheezes.  GI: Soft. Bowel sounds are normal. There is no tenderness.  Musculoskeletal: Normal range of motion.       back and leg pain  Neurological: She is alert and oriented to person, place, and time. Coordination normal.  Skin: Skin is warm and dry.  Psychiatric: She has a normal mood and affect. Her behavior is normal. Judgment and thought content normal.     Assessment/Plan Thoracic #9 and #10 fractures; bone scan confirmation Neuro stimulator placed for chronic back pain 11/2011: helps leg pain Scheduled for VP/KP today in IR Pt aware of procedure benefits and risks and agreeable to proceed. Consent signed.   Lorilei Horan A 03/31/2012, 8:48 AM

## 2012-03-31 NOTE — Discharge Instructions (Signed)
No stooping,bending or lifting more than 10 lbs for 2 weeks Use walker for 2 weeks  RTC in 2 weeksKYPHOPLASTY/VERTEBROPLASTY DISCHARGE INSTRUCTIONS  Medications: (check all that apply)     Resume all home medications as before procedure.       Resume your (aspirin/Plavix/Coumadin) on 04/01/2012                 Continue your pain medications as prescribed as needed.  Over the next 3-5 days, decrease your pain medication as tolerated.  Over the counter medications (i.e. Tylenol, ibuprofen, and aleve) may be substituted once severe/moderate pain symptoms have subsided.   Wound Care:   Bandages may be removed the day following your procedure.  You may get your incision wet once bandages are removed.  Bandaids may be used to cover the incisions until scab formation.  Topical ointments are optional.    If you develop a fever greater than 101 degrees, have increased skin redness at the incision sites or pus-like oozing from incisions occurring within 1 week of the procedure, contact radiology at [phone] or [phone].    Ice pack to back for 15-20 minutes 2-3 time per day for first 2-3 days post procedure.  The ice will expedite muscle healing and help with the pain from the incisions.   Activity:   Bedrest today with limited activity for 24 hours post procedure.    No driving for 48 hours.    Increase your activity as tolerated after bedrest (with assistance if necessary).    Refrain from any strenuous activity or heavy lifting (greater than 10 lbs.).   Follow up:   Contact radiology at [phone] or [phone] if any questions/concerns.    A physician assistant from radiology will contact you in approximately 1 week.    If a biopsy was performed at the time of your procedure, your referring physician should receive the results in usually 2-3 days.

## 2012-03-31 NOTE — Procedures (Signed)
S/P VP at T 9 and T10

## 2012-04-14 ENCOUNTER — Ambulatory Visit (HOSPITAL_COMMUNITY): Payer: Medicare Other

## 2012-04-17 ENCOUNTER — Ambulatory Visit (HOSPITAL_COMMUNITY)
Admission: RE | Admit: 2012-04-17 | Discharge: 2012-04-17 | Disposition: A | Discharge status: Home / Self Care | Payer: Medicare Other | Source: Ambulatory Visit | Attending: Interventional Radiology | Admitting: Interventional Radiology

## 2012-04-17 DIAGNOSIS — IMO0002 Reserved for concepts with insufficient information to code with codable children: Secondary | ICD-10-CM

## 2012-04-17 DIAGNOSIS — M8448XA Pathological fracture, other site, initial encounter for fracture: Secondary | ICD-10-CM | POA: Diagnosis not present

## 2012-04-20 DIAGNOSIS — Z09 Encounter for follow-up examination after completed treatment for conditions other than malignant neoplasm: Secondary | ICD-10-CM | POA: Diagnosis not present

## 2012-04-20 DIAGNOSIS — Z85038 Personal history of other malignant neoplasm of large intestine: Secondary | ICD-10-CM | POA: Diagnosis not present

## 2012-05-02 DIAGNOSIS — G894 Chronic pain syndrome: Secondary | ICD-10-CM | POA: Diagnosis not present

## 2012-05-09 DIAGNOSIS — G589 Mononeuropathy, unspecified: Secondary | ICD-10-CM | POA: Diagnosis not present

## 2012-06-19 ENCOUNTER — Telehealth (HOSPITAL_COMMUNITY): Payer: Self-pay

## 2012-06-19 NOTE — Telephone Encounter (Signed)
Brianna Jacobs called this morning wanting a note from Dr. Corliss Skains stating that it is ok for her to go to the dentist due to a filling falling out.  She wanted a note stating that she could have a pre medication.  I spoke with [name].  She stated that the pt needs to contact her PCP due to Dr. Corliss Skains performed a procedure.  He will not be following this pt unless she has a new fracture.

## 2012-07-25 DIAGNOSIS — G894 Chronic pain syndrome: Secondary | ICD-10-CM | POA: Diagnosis not present

## 2012-09-12 DIAGNOSIS — G894 Chronic pain syndrome: Secondary | ICD-10-CM | POA: Diagnosis not present

## 2012-10-16 ENCOUNTER — Other Ambulatory Visit: Payer: Self-pay | Admitting: Physical Medicine and Rehabilitation

## 2012-10-16 DIAGNOSIS — M545 Low back pain: Secondary | ICD-10-CM | POA: Diagnosis not present

## 2012-10-16 DIAGNOSIS — M546 Pain in thoracic spine: Secondary | ICD-10-CM

## 2012-10-16 DIAGNOSIS — M961 Postlaminectomy syndrome, not elsewhere classified: Secondary | ICD-10-CM | POA: Diagnosis not present

## 2012-10-17 ENCOUNTER — Ambulatory Visit
Admission: RE | Admit: 2012-10-17 | Discharge: 2012-10-17 | Disposition: A | Discharge status: Home / Self Care | Payer: Medicare Other | Source: Ambulatory Visit | Attending: Physical Medicine and Rehabilitation | Admitting: Physical Medicine and Rehabilitation

## 2012-10-17 DIAGNOSIS — M8448XA Pathological fracture, other site, initial encounter for fracture: Secondary | ICD-10-CM | POA: Diagnosis not present

## 2012-10-17 DIAGNOSIS — M546 Pain in thoracic spine: Secondary | ICD-10-CM

## 2012-10-17 DIAGNOSIS — M549 Dorsalgia, unspecified: Secondary | ICD-10-CM | POA: Diagnosis not present

## 2012-10-19 ENCOUNTER — Other Ambulatory Visit: Payer: Medicare Other

## 2012-10-27 ENCOUNTER — Other Ambulatory Visit (HOSPITAL_COMMUNITY): Payer: Self-pay | Admitting: Interventional Radiology

## 2012-10-27 DIAGNOSIS — IMO0002 Reserved for concepts with insufficient information to code with codable children: Secondary | ICD-10-CM

## 2012-10-30 ENCOUNTER — Other Ambulatory Visit (HOSPITAL_COMMUNITY): Payer: Self-pay | Admitting: Physical Medicine and Rehabilitation

## 2012-10-30 DIAGNOSIS — IMO0002 Reserved for concepts with insufficient information to code with codable children: Secondary | ICD-10-CM

## 2012-11-08 ENCOUNTER — Ambulatory Visit (HOSPITAL_COMMUNITY)
Admission: RE | Admit: 2012-11-08 | Discharge: 2012-11-08 | Disposition: A | Discharge status: Home / Self Care | Payer: Medicare Other | Source: Ambulatory Visit | Attending: Physical Medicine and Rehabilitation | Admitting: Physical Medicine and Rehabilitation

## 2012-11-08 DIAGNOSIS — M8448XA Pathological fracture, other site, initial encounter for fracture: Secondary | ICD-10-CM | POA: Diagnosis not present

## 2012-11-08 DIAGNOSIS — IMO0002 Reserved for concepts with insufficient information to code with codable children: Secondary | ICD-10-CM

## 2012-11-09 ENCOUNTER — Other Ambulatory Visit: Payer: Self-pay | Admitting: Radiology

## 2012-11-10 ENCOUNTER — Encounter (HOSPITAL_COMMUNITY): Payer: Self-pay | Admitting: Pharmacist

## 2012-11-13 ENCOUNTER — Other Ambulatory Visit: Payer: Self-pay | Admitting: Radiology

## 2012-11-14 ENCOUNTER — Encounter (HOSPITAL_COMMUNITY): Payer: Self-pay

## 2012-11-14 ENCOUNTER — Ambulatory Visit (HOSPITAL_COMMUNITY)
Admission: RE | Admit: 2012-11-14 | Discharge: 2012-11-14 | Disposition: A | Discharge status: Home / Self Care | Payer: Medicare Other | Source: Ambulatory Visit | Attending: Interventional Radiology | Admitting: Interventional Radiology

## 2012-11-14 DIAGNOSIS — S32009A Unspecified fracture of unspecified lumbar vertebra, initial encounter for closed fracture: Secondary | ICD-10-CM | POA: Diagnosis not present

## 2012-11-14 DIAGNOSIS — IMO0002 Reserved for concepts with insufficient information to code with codable children: Secondary | ICD-10-CM

## 2012-11-14 DIAGNOSIS — M8448XA Pathological fracture, other site, initial encounter for fracture: Secondary | ICD-10-CM | POA: Insufficient documentation

## 2012-11-14 DIAGNOSIS — I10 Essential (primary) hypertension: Secondary | ICD-10-CM | POA: Diagnosis not present

## 2012-11-14 LAB — CBC WITH DIFFERENTIAL/PLATELET
Eosinophils Relative: 6 % — ABNORMAL HIGH (ref 0–5)
Hemoglobin: 13.7 g/dL (ref 12.0–15.0)
Lymphocytes Relative: 30 % (ref 12–46)
Lymphs Abs: 1.6 10*3/uL (ref 0.7–4.0)
MCH: 32.1 pg (ref 26.0–34.0)
MCV: 93.7 fL (ref 78.0–100.0)
Monocytes Relative: 8 % (ref 3–12)
Platelets: 283 10*3/uL (ref 150–400)
RBC: 4.27 MIL/uL (ref 3.87–5.11)
WBC: 5.4 10*3/uL (ref 4.0–10.5)

## 2012-11-14 LAB — APTT: aPTT: 27 seconds (ref 24–37)

## 2012-11-14 LAB — BASIC METABOLIC PANEL
CO2: 27 mEq/L (ref 19–32)
Calcium: 10.3 mg/dL (ref 8.4–10.5)
Chloride: 104 mEq/L (ref 96–112)
Glucose, Bld: 88 mg/dL (ref 70–99)
Potassium: 3.9 mEq/L (ref 3.5–5.1)
Sodium: 140 mEq/L (ref 135–145)

## 2012-11-14 LAB — PROTIME-INR
INR: 1.01 (ref 0.00–1.49)
Prothrombin Time: 13.2 seconds (ref 11.6–15.2)

## 2012-11-14 MED ORDER — HYDROMORPHONE HCL PF 1 MG/ML IJ SOLN
INTRAMUSCULAR | Status: AC | PRN
  Administered 2012-11-14: 1 mg

## 2012-11-14 MED ORDER — IOHEXOL 300 MG/ML  SOLN
50.0000 mL | Freq: Once | INTRAMUSCULAR | Status: AC | PRN
  Administered 2012-11-14: 5 mL

## 2012-11-14 MED ORDER — CEFAZOLIN SODIUM 1-5 GM-% IV SOLN
1.0000 g | Freq: Once | INTRAVENOUS | Status: AC
  Administered 2012-11-14: 1 g via INTRAVENOUS

## 2012-11-14 MED ORDER — FENTANYL CITRATE 0.05 MG/ML IJ SOLN
INTRAMUSCULAR | Status: AC
  Filled 2012-11-14: qty 4

## 2012-11-14 MED ORDER — SODIUM CHLORIDE 0.9 % IV SOLN
INTRAVENOUS | Status: AC
  Administered 2012-11-14: 12:00:00 via INTRAVENOUS

## 2012-11-14 MED ORDER — HYDROMORPHONE HCL PF 1 MG/ML IJ SOLN
INTRAMUSCULAR | Status: AC
  Filled 2012-11-14: qty 2

## 2012-11-14 MED ORDER — FENTANYL CITRATE 0.05 MG/ML IJ SOLN
INTRAMUSCULAR | Status: AC | PRN
  Administered 2012-11-14 (×4): 25 ug via INTRAVENOUS

## 2012-11-14 MED ORDER — SODIUM CHLORIDE 0.9 % IV SOLN
Freq: Once | INTRAVENOUS | Status: AC
  Administered 2012-11-14: 1000 mL via INTRAVENOUS

## 2012-11-14 MED ORDER — MIDAZOLAM HCL 2 MG/2ML IJ SOLN
INTRAMUSCULAR | Status: AC
  Filled 2012-11-14: qty 4

## 2012-11-14 MED ORDER — MIDAZOLAM HCL 2 MG/2ML IJ SOLN
INTRAMUSCULAR | Status: AC | PRN
  Administered 2012-11-14 (×4): 1 mg via INTRAVENOUS

## 2012-11-14 MED ORDER — TOBRAMYCIN SULFATE 1.2 G IJ SOLR
INTRAMUSCULAR | Status: AC
  Filled 2012-11-14: qty 1.2

## 2012-11-14 NOTE — Procedures (Signed)
S/P L1KP with biopsy 

## 2012-11-14 NOTE — ED Notes (Signed)
Additional pain RX admin at less than q 5 min per MD orders

## 2012-11-14 NOTE — H&P (Signed)
Chief Complaint: "I'm here for kyphoplasty"  HPI: Brianna Jacobs is an 79 y.o. female who was seen in clinic for L1 comp fracture, please refer to consult note in PACS for details of HPI. She is now scheduled for VP/KP of L1 comp fx. Pt feels well otherwise, no recent illnesses or fevers PMHx and meds reviewed  Past Medical History:  Past Medical History  Diagnosis Date  . Asthma     occassional usage of inhalers  . Seasonal allergies   . Depression   . ADD (attention deficit disorder)   . Hypertension   . Cancer     colon cancer, 16 yrs ago  . Bowel obstruction   . Neuromuscular disorder     neuropathy  . Arthritis   . Spinal stenosis   . Scoliosis     Past Surgical History:  Past Surgical History  Procedure Date  . Sigmoid resection / rectopexy   . Colonoscopy     2 yrs ago  . Eye surgery     cataracts bilat  . Joint replacement     bilat shoulders  . Fracture surgery     thoracic spine  . Abdominal hysterectomy 1972    partial  . Spinal cord stimulator insertion 11/18/2011    Procedure: LUMBAR SPINAL CORD STIMULATOR INSERTION;  Surgeon: Alvy Beal, MD;  Location: [facility] OR;  Service: Orthopedics;  Laterality: N/A;  Thoracic 9 laminiotomy, SPINAL CORD STIMULATOR PLACEMENT thoracic 9- Thoracic 7    Family History: No family history on file.  Social History:  reports that she quit smoking about 35 years ago. She does not have any smokeless tobacco history on file. She reports that she uses illicit drugs (Oxycodone). She reports that she does not drink alcohol.  Allergies:  Allergies  Allergen Reactions  . Cymbalta (Duloxetine Hcl) Other (See Comments)    Makes me lethargic  . Lyrica (Pregabalin) Other (See Comments)    Makes me lethargic  . Celebrex (Celecoxib) Rash  . Naprosyn (Naproxen) Rash    Medications: albuterol (PROVENTIL HFA;VENTOLIN HFA) 108 (90 BASE) MCG/ACT inhaler Sig - Route: Inhale 2 puffs into the lungs every 6 (six) hours as needed. Shortness  of breath - Inhalation Class: Historical Med buPROPion (WELLBUTRIN XL) 150 MG 24 hr tablet Sig - Route: Take 150 mg by mouth daily. - Oral Class: Historical Med Calcium Carbonate-Vitamin D (CALCIUM + D) 600-200 MG-UNIT TABS Sig - Route: Take 1 tablet by mouth daily. - Oral Class: Historical Med cetirizine (ZYRTEC) 10 MG tablet Sig - Route: Take 10 mg by mouth at bedtime. - Oral Class: Historical Med Number of times this order has been changed since signing: 2 Order [address] fluticasone (FLONASE) 50 MCG/ACT nasal spray Sig - Route: Place 1 spray into the nose daily as needed. For "dripping" nose/sneezing - Nasal Class: Historical Med Number of times this order has been changed since signing: 2 Order [address] losartan (COZAAR) 50 MG tablet Sig - Route: Take 50 mg by mouth daily. - Oral Class: Historical Med Melatonin 10 MG TABS Sig - Route: Take 1 tablet by mouth at bedtime. - Oral Class: Historical Med Multiple Vitamin (MULITIVITAMIN WITH MINERALS) TABS Sig - Route: Take 1 tablet by mouth daily. - Oral Class: Historical Med Olopatadine HCl (PATADAY) 0.2 % SOLN Sig - Route: Place 1 drop into both eyes daily. - Both Eyes Class: Historical Med Number of times this order has been changed since signing: 1 Order [address] Omega-3 Fatty Acids (FISH  OIL) 600 MG CAPS Sig - Route: Take 1,200 capsules by mouth daily. - Oral Class: Historical Med Number of times this order has been changed since signing: 2 Order [address] Propylene Glycol (SYSTANE BALANCE OP) Sig - Route: Apply 1 drop to eye as needed. For dry eyes    Please HPI for pertinent positives, otherwise complete 10 system ROS negative.  Physical Exam: Blood pressure 161/81, pulse 66, temperature 97.9 F (36.6 C), temperature source Oral, resp. rate 18, height 4' 11.5" (1.511 m), weight 149 lb (67.586 kg), SpO2 100.00%. Body mass index is 29.59 kg/(m^2).   General Appearance:  Alert, cooperative, no distress, appears stated age  Head:   Normocephalic, without obvious abnormality, atraumatic  ENT: Unremarkable  Neck: Supple, symmetrical, trachea midline, no adenopathy, thyroid: not enlarged, symmetric, no tenderness/mass/nodules  Lungs:   Clear to auscultation bilaterally, no w/r/r, respirations unlabored without use of accessory muscles.  Chest Wall:  No tenderness or deformity  Heart:  Regular rate and rhythm, S1, S2 normal, no murmur, rub or gallop. Carotids 2+ without bruit.  Neurologic: Normal affect, no gross deficits.   Results for orders placed during the hospital encounter of 11/14/12 (from the past 48 hour(s))  APTT     Status: Normal   Collection Time   11/14/12  8:23 AM      Component Value Range Comment   aPTT 27  24 - 37 seconds   BASIC METABOLIC PANEL     Status: Abnormal   Collection Time   11/14/12  8:23 AM      Component Value Range Comment   Sodium 140  135 - 145 mEq/L    Potassium 3.9  3.5 - 5.1 mEq/L    Chloride 104  96 - 112 mEq/L    CO2 27  19 - 32 mEq/L    Glucose, Bld 88  70 - 99 mg/dL    BUN 15  6 - 23 mg/dL    Creatinine, Ser 4.09  0.50 - 1.10 mg/dL    Calcium 81.1  8.4 - 10.5 mg/dL    GFR calc non Af Amer 77 (*) >90 mL/min    GFR calc Af Amer 89 (*) >90 mL/min   CBC WITH DIFFERENTIAL     Status: Abnormal   Collection Time   11/14/12  8:23 AM      Component Value Range Comment   WBC 5.4  4.0 - 10.5 K/uL    RBC 4.27  3.87 - 5.11 MIL/uL    Hemoglobin 13.7  12.0 - 15.0 g/dL    HCT 91.4  78.2 - 95.6 %    MCV 93.7  78.0 - 100.0 fL    MCH 32.1  26.0 - 34.0 pg    MCHC 34.3  30.0 - 36.0 g/dL    RDW 21.3  08.6 - 57.8 %    Platelets 283  150 - 400 K/uL    Neutrophils Relative 56  43 - 77 %    Neutro Abs 3.0  1.7 - 7.7 K/uL    Lymphocytes Relative 30  12 - 46 %    Lymphs Abs 1.6  0.7 - 4.0 K/uL    Monocytes Relative 8  3 - 12 %    Monocytes Absolute 0.4  0.1 - 1.0 K/uL    Eosinophils Relative 6 (*) 0 - 5 %    Eosinophils Absolute 0.3  0.0 - 0.7 K/uL    Basophils Relative 0  0 - 1 %     Basophils  Absolute 0.0  0.0 - 0.1 K/uL   PROTIME-INR     Status: Normal   Collection Time   11/14/12  8:23 AM      Component Value Range Comment   Prothrombin Time 13.2  11.6 - 15.2 seconds    INR 1.01  0.00 - 1.49    No results found.  Assessment/Plan L1 compression fx. Reviewed procedure, risks, complications, and recovery. Labs ok. Consent signed in chart  Brayton El PA-C 11/14/2012, 9:30 AM

## 2012-11-15 ENCOUNTER — Telehealth (HOSPITAL_COMMUNITY): Payer: Self-pay | Admitting: *Deleted

## 2012-11-15 NOTE — Telephone Encounter (Signed)
Post procedure follow up call. Pt says doing ok, having a lot of pain took prn pain medications this AM.  Denies questions or concerns.  Says she received great care and thanks everyone.

## 2012-11-16 ENCOUNTER — Other Ambulatory Visit (HOSPITAL_COMMUNITY): Payer: Self-pay | Admitting: Interventional Radiology

## 2012-11-16 DIAGNOSIS — IMO0002 Reserved for concepts with insufficient information to code with codable children: Secondary | ICD-10-CM

## 2012-11-28 ENCOUNTER — Ambulatory Visit (HOSPITAL_COMMUNITY)
Admission: RE | Admit: 2012-11-28 | Discharge: 2012-11-28 | Disposition: A | Discharge status: Home / Self Care | Payer: Medicare Other | Source: Ambulatory Visit | Attending: Interventional Radiology | Admitting: Interventional Radiology

## 2012-11-28 DIAGNOSIS — IMO0002 Reserved for concepts with insufficient information to code with codable children: Secondary | ICD-10-CM

## 2012-11-28 DIAGNOSIS — Z5189 Encounter for other specified aftercare: Secondary | ICD-10-CM | POA: Diagnosis not present

## 2012-11-28 DIAGNOSIS — M8448XA Pathological fracture, other site, initial encounter for fracture: Secondary | ICD-10-CM | POA: Diagnosis not present

## 2012-12-04 ENCOUNTER — Other Ambulatory Visit (HOSPITAL_COMMUNITY): Payer: Self-pay | Admitting: Interventional Radiology

## 2012-12-04 DIAGNOSIS — IMO0002 Reserved for concepts with insufficient information to code with codable children: Secondary | ICD-10-CM

## 2012-12-19 ENCOUNTER — Ambulatory Visit (HOSPITAL_COMMUNITY): Admission: RE | Admit: 2012-12-19 | Payer: Medicare Other | Source: Ambulatory Visit

## 2012-12-28 ENCOUNTER — Ambulatory Visit (HOSPITAL_COMMUNITY)
Admission: RE | Admit: 2012-12-28 | Discharge: 2012-12-28 | Disposition: A | Discharge status: Home / Self Care | Payer: Medicare Other | Source: Ambulatory Visit | Attending: Interventional Radiology | Admitting: Interventional Radiology

## 2012-12-28 DIAGNOSIS — M545 Low back pain: Secondary | ICD-10-CM | POA: Diagnosis not present

## 2013-01-01 ENCOUNTER — Other Ambulatory Visit: Payer: Self-pay | Admitting: Family Medicine

## 2013-01-01 DIAGNOSIS — M545 Low back pain: Secondary | ICD-10-CM | POA: Diagnosis not present

## 2013-01-17 ENCOUNTER — Telehealth: Payer: Self-pay | Admitting: Family Medicine

## 2013-01-17 ENCOUNTER — Ambulatory Visit (INDEPENDENT_AMBULATORY_CARE_PROVIDER_SITE_OTHER): Payer: Medicare Other | Admitting: Family Medicine

## 2013-01-17 ENCOUNTER — Encounter: Payer: Self-pay | Admitting: Family Medicine

## 2013-01-17 VITALS — BP 140/80 | HR 68 | Temp 98.5°F | Resp 16 | Wt 152.0 lb

## 2013-01-17 DIAGNOSIS — E785 Hyperlipidemia, unspecified: Secondary | ICD-10-CM | POA: Diagnosis not present

## 2013-01-17 DIAGNOSIS — N6320 Unspecified lump in the left breast, unspecified quadrant: Secondary | ICD-10-CM

## 2013-01-17 DIAGNOSIS — N63 Unspecified lump in unspecified breast: Secondary | ICD-10-CM

## 2013-01-17 LAB — CBC WITH DIFFERENTIAL/PLATELET
Eosinophils Absolute: 0.2 10*3/uL (ref 0.0–0.7)
Hemoglobin: 13.1 g/dL (ref 12.0–15.0)
Lymphocytes Relative: 27 % (ref 12–46)
Lymphs Abs: 1.6 10*3/uL (ref 0.7–4.0)
Monocytes Relative: 7 % (ref 3–12)
Neutro Abs: 3.7 10*3/uL (ref 1.7–7.7)
Neutrophils Relative %: 63 % (ref 43–77)
Platelets: 334 10*3/uL (ref 150–400)
RBC: 4.2 MIL/uL (ref 3.87–5.11)
WBC: 5.9 10*3/uL (ref 4.0–10.5)

## 2013-01-17 LAB — HEPATIC FUNCTION PANEL
ALT: 23 U/L (ref 0–35)
AST: 24 U/L (ref 0–37)
Alkaline Phosphatase: 41 U/L (ref 39–117)
Bilirubin, Direct: 0.1 mg/dL (ref 0.0–0.3)
Total Bilirubin: 0.5 mg/dL (ref 0.3–1.2)

## 2013-01-17 LAB — LIPID PANEL
LDL Cholesterol: 136 mg/dL — ABNORMAL HIGH (ref 0–99)
Total CHOL/HDL Ratio: 3 Ratio
VLDL: 19 mg/dL (ref 0–40)

## 2013-01-17 LAB — BASIC METABOLIC PANEL
BUN: 14 mg/dL (ref 6–23)
Chloride: 105 mEq/L (ref 96–112)
Potassium: 4.9 mEq/L (ref 3.5–5.3)
Sodium: 144 mEq/L (ref 135–145)

## 2013-01-17 NOTE — Telephone Encounter (Signed)
Orders faxed

## 2013-01-17 NOTE — Progress Notes (Signed)
Subjective:    Patient ID: Brianna Jacobs, female    DOB: 1929/10/29, 79 y.o.   MRN: [id number]  HPI  Patient reports 2 months of pain in her left breast. The pain is at approximately 3:00 and radiates into the axilla as well as the nipple. She denies any injury, falls or contusions. She denies any nipple discharge. She denies any axillary pain. Her last mammogram was normal in May 2013. Past Medical History  Diagnosis Date  . Asthma     occassional usage of inhalers  . Seasonal allergies   . Depression   . ADD (attention deficit disorder)   . Hypertension   . Cancer     colon cancer, 16 yrs ago  . Bowel obstruction   . Neuromuscular disorder     neuropathy  . Arthritis   . Spinal stenosis   . Scoliosis    Current Outpatient Prescriptions on File Prior to Visit  Medication Sig Dispense Refill  . albuterol (PROVENTIL HFA;VENTOLIN HFA) 108 (90 BASE) MCG/ACT inhaler Inhale 2 puffs into the lungs every 6 (six) hours as needed. Shortness of breath      . aspirin 81 MG chewable tablet Chew 81 mg by mouth every other day.      . Calcium Carbonate-Vitamin D (CALCIUM + D) 600-200 MG-UNIT TABS Take 1 tablet by mouth daily.      . cetirizine (ZYRTEC) 10 MG tablet TAKE 1 TABLET BY MOUTH EVERY DAY  90 tablet  1  . cyanocobalamin 2000 MCG tablet Take 2,000 mcg by mouth daily.      . fluticasone (FLONASE) 50 MCG/ACT nasal spray Place 1 spray into the nose daily as needed. For "dripping" nose/sneezing      . gabapentin (NEURONTIN) 300 MG capsule Take 300 mg by mouth 3 (three) times daily.      Marland Kitchen losartan (COZAAR) 50 MG tablet TAKE 1 TABLET BY MOUTH EVERY DAY  90 tablet  1  . Melatonin 10 MG TABS Take 1 tablet by mouth at bedtime.      . Multiple Vitamin (MULITIVITAMIN WITH MINERALS) TABS Take 1 tablet by mouth daily.      . Olopatadine HCl (PATADAY) 0.2 % SOLN Place 1 drop into both eyes daily.       . Omega-3 Fatty Acids (FISH OIL) 600 MG CAPS Take 1,200 capsules by mouth daily.       Marland Kitchen  oxyCODONE-acetaminophen (PERCOCET) 10-325 MG per tablet Take 1 tablet by mouth every 6 (six) hours as needed. For pain      . Propylene Glycol (SYSTANE BALANCE OP) Apply 1 drop to eye as needed. For dry eyes      . WELLBUTRIN XL 150 MG 24 hr tablet TAKE 1 TABLET BY MOUTH EVERY DAY  90 tablet  1   No current facility-administered medications on file prior to visit.   History   Social History  . Marital Status: Married    Spouse Name: N/A    Number of Children: N/A  . Years of Education: N/A   Occupational History  . Not on file.   Social History Main Topics  . Smoking status: Former Smoker    Quit date: 10/11/1977  . Smokeless tobacco: Not on file  . Alcohol Use: No  . Drug Use: Yes    Special: Oxycodone  . Sexually Active:    Other Topics Concern  . Not on file   Social History Narrative  . No narrative on file     Review  of Systems  All other systems reviewed and are negative.       Objective:   Physical Exam  Constitutional: She appears well-developed and well-nourished.  Cardiovascular: Normal rate, regular rhythm and normal heart sounds.   Pulmonary/Chest: Effort normal and breath sounds normal. No respiratory distress. She has no wheezes. She has no rales.   there is a 1.5 cm soft tissue mass in the left breast appreciated at 3 o'clock.  There is no axillary adenopathy appreciated.        Assessment & Plan:  1. Left breast mass We'll obtain diagnostic mammogram as soon as possible and then commence the work up pending its result. - MM Digital Diagnostic Bilat; Future  2. Other and unspecified hyperlipidemia The patient is here fasting lites obtain her baseline labs while she's here which will facilitate. - Basic Metabolic Panel - Hepatic Function Panel - Lipid Panel - CBC with Differential

## 2013-01-18 DIAGNOSIS — N6489 Other specified disorders of breast: Secondary | ICD-10-CM | POA: Diagnosis not present

## 2013-01-31 DIAGNOSIS — M961 Postlaminectomy syndrome, not elsewhere classified: Secondary | ICD-10-CM | POA: Diagnosis not present

## 2013-01-31 DIAGNOSIS — M545 Low back pain: Secondary | ICD-10-CM | POA: Diagnosis not present

## 2013-02-14 ENCOUNTER — Encounter: Payer: Self-pay | Admitting: Family Medicine

## 2013-04-02 DIAGNOSIS — M961 Postlaminectomy syndrome, not elsewhere classified: Secondary | ICD-10-CM | POA: Diagnosis not present

## 2013-05-10 DIAGNOSIS — H04129 Dry eye syndrome of unspecified lacrimal gland: Secondary | ICD-10-CM | POA: Diagnosis not present

## 2013-05-16 ENCOUNTER — Other Ambulatory Visit: Payer: Self-pay

## 2013-06-05 DIAGNOSIS — M961 Postlaminectomy syndrome, not elsewhere classified: Secondary | ICD-10-CM | POA: Diagnosis not present

## 2013-06-07 ENCOUNTER — Ambulatory Visit (INDEPENDENT_AMBULATORY_CARE_PROVIDER_SITE_OTHER): Payer: Medicare Other | Admitting: Family Medicine

## 2013-06-07 ENCOUNTER — Encounter: Payer: Self-pay | Admitting: Family Medicine

## 2013-06-07 VITALS — BP 126/62 | HR 80 | Temp 98.3°F | Resp 16 | Wt 147.0 lb

## 2013-06-07 DIAGNOSIS — R1310 Dysphagia, unspecified: Secondary | ICD-10-CM

## 2013-06-07 DIAGNOSIS — K219 Gastro-esophageal reflux disease without esophagitis: Secondary | ICD-10-CM | POA: Diagnosis not present

## 2013-06-07 MED ORDER — PANTOPRAZOLE SODIUM 40 MG PO TBEC: 40.0000 mg | DELAYED_RELEASE_TABLET | Freq: Every day | ORAL | Status: DC

## 2013-06-07 NOTE — Progress Notes (Signed)
Subjective:    Patient ID: Brianna Jacobs, female    DOB: [birth year], 79 y.o.   MRN: [id number]  HPI Patient is a pleasant 79 year old white female who is here today due to a one-month history of progressive dysphagia to liquids and now to solids. She reports a difficult time swallowing solid food and liquid food. She reports burning in the center of her chest while she is swallowing. This has steadily worsened. She also reports a feeling of food sticking while she is swallowing. She denies any melena or hematochezia. She denies any fevers or chills or weight loss. She does report significant indigestion and reflux. She frequently has a bitter fluid reflux into her throat and mouth which sounds like stomach contents. Past Medical History  Diagnosis Date  . Asthma     occassional usage of inhalers  . Seasonal allergies   . Depression   . ADD (attention deficit disorder)   . Hypertension   . Cancer     colon cancer, 16 yrs ago  . Bowel obstruction   . Neuromuscular disorder     neuropathy  . Arthritis   . Spinal stenosis   . Scoliosis    Current Outpatient Prescriptions on File Prior to Visit  Medication Sig Dispense Refill  . albuterol (PROVENTIL HFA;VENTOLIN HFA) 108 (90 BASE) MCG/ACT inhaler Inhale 2 puffs into the lungs every 6 (six) hours as needed. Shortness of breath      . aspirin 81 MG chewable tablet Chew 81 mg by mouth every other day.      . Calcium Carbonate-Vitamin D (CALCIUM + D) 600-200 MG-UNIT TABS Take 1 tablet by mouth daily.      . cetirizine (ZYRTEC) 10 MG tablet TAKE 1 TABLET BY MOUTH EVERY DAY  90 tablet  1  . cyanocobalamin 2000 MCG tablet Take 2,000 mcg by mouth daily.      . fluticasone (FLONASE) 50 MCG/ACT nasal spray Place 1 spray into the nose daily as needed. For "dripping" nose/sneezing      . gabapentin (NEURONTIN) 300 MG capsule Take 300 mg by mouth 3 (three) times daily.      Marland Kitchen losartan (COZAAR) 50 MG tablet TAKE 1 TABLET BY MOUTH EVERY DAY  90 tablet  1   . Melatonin 10 MG TABS Take 1 tablet by mouth at bedtime.      . Multiple Vitamin (MULITIVITAMIN WITH MINERALS) TABS Take 1 tablet by mouth daily.      . Olopatadine HCl (PATADAY) 0.2 % SOLN Place 1 drop into both eyes daily.       . Omega-3 Fatty Acids (FISH OIL) 600 MG CAPS Take 1,200 capsules by mouth daily.       Marland Kitchen oxyCODONE-acetaminophen (PERCOCET) 10-325 MG per tablet Take 1 tablet by mouth every 6 (six) hours as needed. For pain      . Propylene Glycol (SYSTANE BALANCE OP) Apply 1 drop to eye as needed. For dry eyes      . WELLBUTRIN XL 150 MG 24 hr tablet TAKE 1 TABLET BY MOUTH EVERY DAY  90 tablet  1   No current facility-administered medications on file prior to visit.   Allergies  Allergen Reactions  . Cymbalta [Duloxetine Hcl] Other (See Comments)    Makes me lethargic  . Lyrica [Pregabalin] Other (See Comments)    Makes me lethargic  . Celebrex [Celecoxib] Rash  . Naprosyn [Naproxen] Rash   Past Surgical History  Procedure Laterality Date  . Sigmoid resection / rectopexy    .  Colonoscopy      2 yrs ago  . Eye surgery      cataracts bilat  . Joint replacement      bilat shoulders  . Fracture surgery      thoracic spine  . Abdominal hysterectomy  1972    partial  . Spinal cord stimulator insertion  11/18/2011    Procedure: LUMBAR SPINAL CORD STIMULATOR INSERTION;  Surgeon: Alvy Beal, MD;  Location: [facility] OR;  Service: Orthopedics;  Laterality: N/A;  Thoracic 9 laminiotomy, SPINAL CORD STIMULATOR PLACEMENT thoracic 9- Thoracic 7   History   Social History  . Marital Status: Married    Spouse Name: N/A    Number of Children: N/A  . Years of Education: N/A   Occupational History  . Not on file.   Social History Main Topics  . Smoking status: Former Smoker    Quit date: 10/11/1977  . Smokeless tobacco: Not on file  . Alcohol Use: No  . Drug Use: Yes    Special: Oxycodone  . Sexual Activity:    Other Topics Concern  . Not on file   Social History  Narrative  . No narrative on file      Review of Systems  All other systems reviewed and are negative.       Objective:   Physical Exam  Vitals reviewed. Cardiovascular: Normal rate, regular rhythm, normal heart sounds and intact distal pulses.   No murmur heard. Pulmonary/Chest: Effort normal and breath sounds normal. No respiratory distress. She has no wheezes. She has no rales. She exhibits no tenderness.  Abdominal: Soft. Bowel sounds are normal. She exhibits no distension and no mass. There is no tenderness. There is no rebound and no guarding.          Assessment & Plan:  1. GERD (gastroesophageal reflux disease) - pantoprazole (PROTONIX) 40 MG tablet; Take 1 tablet (40 mg total) by mouth daily.  Dispense: 30 tablet; Refill: 3 - Ambulatory referral to Gastroenterology  2. Dysphagia, unspecified(787.20) Begin protonix 40 mg poqday to treat gastroesophageal reflux disease. Recheck in 2 weeks to see if chest pain and dysphagia has improved. Probably the patient is describing what sounds like an esophageal stricture. Therefore am willing to consult gastroenterology for possible EGD to evaluate for stricture and possible dilatation. - Ambulatory referral to Gastroenterology

## 2013-06-14 ENCOUNTER — Encounter: Payer: Self-pay | Admitting: Gastroenterology

## 2013-06-25 ENCOUNTER — Telehealth: Payer: Self-pay | Admitting: Family Medicine

## 2013-06-25 NOTE — Telephone Encounter (Signed)
Tried to call no answer and no vm 

## 2013-06-25 NOTE — Telephone Encounter (Signed)
It could cause diarrhea.  I doubt the neck pain or stomach pain is due to pantoprazole.  Try holding the med for 4-5 days and see if symptoms improve.  If not, NTBS.

## 2013-06-25 NOTE — Telephone Encounter (Signed)
Pt said that she just started the pantoprazole and she is having a lot of side effects. They include, uncontrollable diarrhea, mucous in stools, neck pain, and stomach pain. She also read where she is not supposed to take it with pain medication (she is on oxycodone) and anti-diarrhea medication which is taking. She did not take it this morning. What should she do?

## 2013-06-26 NOTE — Telephone Encounter (Signed)
.  Patient aware and will call back Friday if no better to be seen

## 2013-07-02 ENCOUNTER — Other Ambulatory Visit: Payer: Self-pay | Admitting: Family Medicine

## 2013-07-02 NOTE — Telephone Encounter (Signed)
Medication refilled per protocol. 

## 2013-07-03 ENCOUNTER — Other Ambulatory Visit: Payer: Self-pay | Admitting: Family Medicine

## 2013-07-12 ENCOUNTER — Encounter: Payer: Self-pay | Admitting: Gastroenterology

## 2013-07-12 ENCOUNTER — Ambulatory Visit: Payer: Medicare Other | Admitting: Gastroenterology

## 2013-07-12 VITALS — BP 150/64 | HR 70 | Ht 59.5 in | Wt 145.0 lb

## 2013-07-12 DIAGNOSIS — R1310 Dysphagia, unspecified: Secondary | ICD-10-CM

## 2013-07-12 DIAGNOSIS — Z85038 Personal history of other malignant neoplasm of large intestine: Secondary | ICD-10-CM

## 2013-07-12 DIAGNOSIS — K5289 Other specified noninfective gastroenteritis and colitis: Secondary | ICD-10-CM | POA: Insufficient documentation

## 2013-07-12 MED ORDER — RANITIDINE HCL 150 MG PO TABS: 150.0000 mg | ORAL_TABLET | Freq: Two times a day (BID) | ORAL | Status: DC

## 2013-07-12 NOTE — Progress Notes (Signed)
History of Present Illness: 79 year old white female with history of colon cancer referred for evaluation of odynophagia and dysphagia.  She has neck and pain radiating to her jaw and ears with belching and swallowing.  She's also having dysphagia to solids and liquids.  She denies pyrosis, per se.  There is no history of antibiotic use.  She briefly took protonix which flared her colitis characterized by multiple loose stools.  She did not recall the brief therapy with Protonix helping her upper GI symptoms.  The patient has a history of colitis and colon cancer.  The latter was resected 16 years ago.  She's been followed by [facility].    Past Medical History  Diagnosis Date  . Asthma     occassional usage of inhalers  . Seasonal allergies   . Depression   . ADD (attention deficit disorder)   . Hypertension   . Cancer     colon cancer, 16 yrs ago  . Bowel obstruction   . Neuromuscular disorder     neuropathy  . Arthritis   . Spinal stenosis   . Scoliosis    Past Surgical History  Procedure Laterality Date  . Sigmoid resection / rectopexy    . Colonoscopy      2 yrs ago  . Eye surgery      cataracts bilat  . Joint replacement      bilat shoulders  . Fracture surgery      thoracic spine  . Abdominal hysterectomy  1972    partial  . Spinal cord stimulator insertion  11/18/2011    Procedure: LUMBAR SPINAL CORD STIMULATOR INSERTION;  Surgeon: Alvy Beal, MD;  Location: [facility];  Service: Orthopedics;  Laterality: N/A;  Thoracic 9 laminiotomy, SPINAL CORD STIMULATOR PLACEMENT thoracic 9- Thoracic 7   family history includes Bladder Cancer in her sister; Breast cancer in her sister; Colon cancer in her mother; Heart disease in her brother and father; Prostate cancer in her brother. Current Outpatient Prescriptions  Medication Sig Dispense Refill  . albuterol (PROVENTIL HFA;VENTOLIN HFA) 108 (90 BASE) MCG/ACT inhaler Inhale 2 puffs into the lungs every 6 (six) hours as needed.  Shortness of breath      . aspirin 81 MG chewable tablet Chew 81 mg by mouth every other day.      . Calcium Carbonate-Vitamin D (CALCIUM + D) 600-200 MG-UNIT TABS Take 1 tablet by mouth daily.      . cyanocobalamin 2000 MCG tablet Take 2,000 mcg by mouth daily.      . fluticasone (FLONASE) 50 MCG/ACT nasal spray Place 1 spray into the nose daily as needed. For "dripping" nose/sneezing      . gabapentin (NEURONTIN) 300 MG capsule Take 300 mg by mouth 3 (three) times daily.      Marland Kitchen losartan (COZAAR) 50 MG tablet TAKE 1 TABLET DAILY  90 tablet  0  . Melatonin 10 MG TABS Take 1 tablet by mouth at bedtime.      . Multiple Vitamin (MULITIVITAMIN WITH MINERALS) TABS Take 1 tablet by mouth daily.      . Olopatadine HCl (PATADAY) 0.2 % SOLN Place 1 drop into both eyes daily.       . Omega-3 Fatty Acids (FISH OIL) 600 MG CAPS Take 1,200 capsules by mouth daily.       Marland Kitchen oxyCODONE-acetaminophen (PERCOCET) 10-325 MG per tablet Take 1 tablet by mouth every 6 (six) hours as needed. For pain      . pantoprazole (PROTONIX)  40 MG tablet Take 1 tablet (40 mg total) by mouth daily.  30 tablet  3  . Propylene Glycol (SYSTANE BALANCE OP) Apply 1 drop to eye as needed. For dry eyes      . WELLBUTRIN XL 150 MG 24 hr tablet TAKE 1 TABLET DAILY  90 tablet  0  . ZYRTEC ALLERGY 10 MG tablet TAKE 1 TABLET DAILY  90 tablet  3   No current facility-administered medications for this visit.   Allergies as of 07/12/2013 - Review Complete 07/12/2013  Allergen Reaction Noted  . Cymbalta [duloxetine hcl] Other (See Comments) 03/31/2012  . Lyrica [pregabalin] Other (See Comments) 03/31/2012  . Celebrex [celecoxib] Rash 11/10/2011  . Naprosyn [naproxen] Rash 11/10/2011    reports that she quit smoking about 35 years ago. She has never used smokeless tobacco. She reports that she uses illicit drugs (Oxycodone). She reports that she does not drink alcohol.     Review of Systems: She suffers from chronic back and knee pain  Pertinent positive and negative review of systems were noted in the above HPI section. All other review of systems were otherwise negative.  Vital signs were reviewed in today's medical record Physical Exam: General: Well developed , well nourished, no acute distress Skin: anicteric Head: Normocephalic and atraumatic Eyes:  sclerae anicteric, EOMI Ears: Normal auditory acuity Mouth: No deformity or lesions Neck: Supple, no masses or thyromegaly Lungs: Clear throughout to auscultation Heart: Regular rate and rhythm; no murmurs, rubs or bruits Abdomen: Soft, non tender and non distended. No masses, hepatosplenomegaly or hernias noted. Normal Bowel sounds Rectal:deferred Musculoskeletal: Symmetrical with no gross deformities  Skin: No lesions on visible extremities Pulses:  Normal pulses noted Extremities: No clubbing, cyanosis, edema or deformities noted Neurological: Alert oriented x 4, grossly nonfocal Cervical Nodes:  No significant cervical adenopathy Inguinal Nodes: No significant inguinal adenopathy Psychological:  Alert and cooperative. Normal mood and affect

## 2013-07-12 NOTE — Assessment & Plan Note (Signed)
The patient is followed by [facility].  I offered to transfer her care for her upper GI complaints back to her other gastroenterologist but she wishes to continue followup for this problem at [facility]

## 2013-07-12 NOTE — Assessment & Plan Note (Addendum)
The patient is suffering from both dysphagia and odynophagia.  At this could be do to peptic stricture with or without esophagitis.  Infectious esophagitis such as Candida is also a consideration.  Symptoms may also be do to reflux.  PPI therapy exacerbated her colitic symptoms.  Recommendations #1 upper endoscopy with dilatation as indicated #2 trial of Zantac 150 mg twice a day

## 2013-07-12 NOTE — Patient Instructions (Addendum)

## 2013-07-13 ENCOUNTER — Telehealth: Payer: Self-pay | Admitting: Gastroenterology

## 2013-07-13 NOTE — Telephone Encounter (Signed)
Discussed with pt and she had questions about the prep for her EGD. Reviewed instructions with pt and she verbalized understanding. Discussed with pt that script had been sent to her pharmacy for Zantac yesterday.

## 2013-07-16 ENCOUNTER — Other Ambulatory Visit: Payer: Self-pay | Admitting: Family Medicine

## 2013-07-16 MED ORDER — OLOPATADINE HCL 0.2 % OP SOLN: 1.0000 [drp] | Freq: Every day | OPHTHALMIC | Status: DC

## 2013-07-16 MED ORDER — ALBUTEROL SULFATE HFA 108 (90 BASE) MCG/ACT IN AERS: 2.0000 | INHALATION_SPRAY | Freq: Four times a day (QID) | RESPIRATORY_TRACT | Status: DC | PRN

## 2013-07-20 ENCOUNTER — Ambulatory Visit (INDEPENDENT_AMBULATORY_CARE_PROVIDER_SITE_OTHER): Payer: Medicare Other | Admitting: Family Medicine

## 2013-07-20 DIAGNOSIS — Z23 Encounter for immunization: Secondary | ICD-10-CM | POA: Diagnosis not present

## 2013-07-27 DIAGNOSIS — M961 Postlaminectomy syndrome, not elsewhere classified: Secondary | ICD-10-CM | POA: Diagnosis not present

## 2013-08-07 ENCOUNTER — Encounter: Payer: Medicare Other | Admitting: Gastroenterology

## 2013-08-10 ENCOUNTER — Ambulatory Visit (INDEPENDENT_AMBULATORY_CARE_PROVIDER_SITE_OTHER): Payer: Medicare Other | Admitting: Family Medicine

## 2013-08-10 ENCOUNTER — Encounter: Payer: Self-pay | Admitting: Family Medicine

## 2013-08-10 VITALS — BP 170/68 | HR 72 | Temp 99.9°F | Resp 16 | Wt 145.0 lb

## 2013-08-10 DIAGNOSIS — J209 Acute bronchitis, unspecified: Secondary | ICD-10-CM

## 2013-08-10 MED ORDER — AZITHROMYCIN 250 MG PO TABS: ORAL_TABLET | ORAL | Status: DC

## 2013-08-10 NOTE — Progress Notes (Signed)
Subjective:    Patient ID: Brianna Jacobs, female    DOB: Jan 26, 1930, 79 y.o.   MRN: [id number]  HPI  Patient has had a productive cough for more than a week. Unfortunately she is now starting to have a fever to 100-101. The cough is also productive of trace hemoptysis. She also complains of pleurisy in  the anterior bases of both lungs but she attributes this to coughing. She denies any rhinorrhea, otalgia, or sore throat. She denies any shortness of breath. Past Medical History  Diagnosis Date  . Asthma     occassional usage of inhalers  . Seasonal allergies   . Depression   . ADD (attention deficit disorder)   . Hypertension   . Cancer     colon cancer, 16 yrs ago  . Bowel obstruction   . Neuromuscular disorder     neuropathy  . Arthritis   . Spinal stenosis   . Scoliosis    Current Outpatient Prescriptions on File Prior to Visit  Medication Sig Dispense Refill  . albuterol (PROVENTIL HFA;VENTOLIN HFA) 108 (90 BASE) MCG/ACT inhaler Inhale 2 puffs into the lungs every 6 (six) hours as needed. Shortness of breath  3 Inhaler  1  . aspirin 81 MG chewable tablet Chew 81 mg by mouth every other day.      . Calcium Carbonate-Vitamin D (CALCIUM + D) 600-200 MG-UNIT TABS Take 1 tablet by mouth daily.      . cyanocobalamin 2000 MCG tablet Take 2,000 mcg by mouth daily.      . fluticasone (FLONASE) 50 MCG/ACT nasal spray Place 1 spray into the nose daily as needed. For "dripping" nose/sneezing      . gabapentin (NEURONTIN) 300 MG capsule Take 300 mg by mouth 3 (three) times daily.      Marland Kitchen losartan (COZAAR) 50 MG tablet TAKE 1 TABLET DAILY  90 tablet  0  . Melatonin 10 MG TABS Take 1 tablet by mouth at bedtime.      . Multiple Vitamin (MULITIVITAMIN WITH MINERALS) TABS Take 1 tablet by mouth daily.      . Olopatadine HCl (PATADAY) 0.2 % SOLN Place 1 drop into both eyes daily.  3 Bottle  3  . Omega-3 Fatty Acids (FISH OIL) 600 MG CAPS Take 1,200 capsules by mouth daily.       Marland Kitchen  oxyCODONE-acetaminophen (PERCOCET) 10-325 MG per tablet Take 1 tablet by mouth every 6 (six) hours as needed. For pain      . pantoprazole (PROTONIX) 40 MG tablet Take 1 tablet (40 mg total) by mouth daily.  30 tablet  3  . Propylene Glycol (SYSTANE BALANCE OP) Apply 1 drop to eye as needed. For dry eyes      . ranitidine (ZANTAC) 150 MG tablet Take 1 tablet (150 mg total) by mouth 2 (two) times daily.  30 tablet  5  . WELLBUTRIN XL 150 MG 24 hr tablet TAKE 1 TABLET DAILY  90 tablet  0  . ZYRTEC ALLERGY 10 MG tablet TAKE 1 TABLET DAILY  90 tablet  3   No current facility-administered medications on file prior to visit.   Allergies  Allergen Reactions  . Cymbalta [Duloxetine Hcl] Other (See Comments)    Makes me lethargic  . Lyrica [Pregabalin] Other (See Comments)    Makes me lethargic  . Ranitidine Diarrhea  . Celebrex [Celecoxib] Rash  . Naprosyn [Naproxen] Rash   History   Social History  . Marital Status: Married  Spouse Name: N/A    Number of Children: N/A  . Years of Education: N/A   Occupational History  . Not on file.   Social History Main Topics  . Smoking status: Former Smoker    Quit date: 10/11/1977  . Smokeless tobacco: Never Used  . Alcohol Use: No  . Drug Use: Yes    Special: Oxycodone  . Sexual Activity: Not on file   Other Topics Concern  . Not on file   Social History Narrative  . No narrative on file     Review of Systems  All other systems reviewed and are negative.       Objective:   Physical Exam  Vitals reviewed. Constitutional: She appears well-developed and well-nourished.  HENT:  Right Ear: External ear normal.  Left Ear: External ear normal.  Nose: Nose normal.  Mouth/Throat: Oropharynx is clear and moist. No oropharyngeal exudate.  Eyes: Conjunctivae are normal. No scleral icterus.  Neck: Neck supple. No JVD present.  Cardiovascular: Normal rate, regular rhythm and normal heart sounds.  Exam reveals no gallop and no  friction rub.   No murmur heard. Pulmonary/Chest: Effort normal and breath sounds normal. No respiratory distress. She has no wheezes. She has no rales. She exhibits no tenderness.  Lymphadenopathy:    She has no cervical adenopathy.          Assessment & Plan:  1. Acute bronchitis I do not appreciate any crackles or rales today on exam. Her lung sounds are fairly clear. However given the fever and hemoptysis I will treat the patient with azithromycin for possible walking pneumonia or bacterial bronchitis. 500 mg by mouth daily one, 250 mg by mouth daily 2 through 5. Sig medical attention immediately if symptoms worsen for a chest x-ray. Recheck blood pressure next week. She is currently taking Mucinex D and I recommended against Sudafed. - azithromycin (ZITHROMAX) 250 MG tablet; 2 tabs poqday 1, 1 tab poqday 2-5  Dispense: 6 tablet; Refill: 0

## 2013-08-16 ENCOUNTER — Encounter: Payer: Medicare Other | Admitting: Gastroenterology

## 2013-08-25 ENCOUNTER — Other Ambulatory Visit: Payer: Self-pay | Admitting: Family Medicine

## 2013-09-12 ENCOUNTER — Other Ambulatory Visit: Payer: Self-pay | Admitting: Family Medicine

## 2013-10-02 DIAGNOSIS — M961 Postlaminectomy syndrome, not elsewhere classified: Secondary | ICD-10-CM | POA: Diagnosis not present

## 2013-10-16 ENCOUNTER — Ambulatory Visit (INDEPENDENT_AMBULATORY_CARE_PROVIDER_SITE_OTHER): Payer: Medicare Other | Admitting: *Deleted

## 2013-10-16 DIAGNOSIS — Z23 Encounter for immunization: Secondary | ICD-10-CM | POA: Diagnosis not present

## 2013-10-19 ENCOUNTER — Telehealth: Payer: Self-pay | Admitting: Gastroenterology

## 2013-10-19 NOTE — Telephone Encounter (Signed)
Answered all questions from patient  Patient asked should she take her Oxycotin  I told her to leave it off for the morning. And take the rest of her meds She was worried about taking her pain meds while getting propofol

## 2013-10-19 NOTE — Telephone Encounter (Signed)
ok 

## 2013-10-26 ENCOUNTER — Encounter: Payer: Self-pay | Admitting: Gastroenterology

## 2013-10-26 ENCOUNTER — Ambulatory Visit (AMBULATORY_SURGERY_CENTER): Payer: Medicare Other | Admitting: Gastroenterology

## 2013-10-26 VITALS — BP 140/70 | HR 60 | Temp 98.0°F | Resp 18 | Ht 59.0 in | Wt 145.0 lb

## 2013-10-26 DIAGNOSIS — K209 Esophagitis, unspecified without bleeding: Secondary | ICD-10-CM

## 2013-10-26 DIAGNOSIS — R1319 Other dysphagia: Secondary | ICD-10-CM | POA: Diagnosis not present

## 2013-10-26 DIAGNOSIS — J45909 Unspecified asthma, uncomplicated: Secondary | ICD-10-CM | POA: Diagnosis not present

## 2013-10-26 DIAGNOSIS — K222 Esophageal obstruction: Secondary | ICD-10-CM

## 2013-10-26 DIAGNOSIS — R1310 Dysphagia, unspecified: Secondary | ICD-10-CM | POA: Diagnosis not present

## 2013-10-26 DIAGNOSIS — I10 Essential (primary) hypertension: Secondary | ICD-10-CM | POA: Diagnosis not present

## 2013-10-26 MED ORDER — SODIUM CHLORIDE 0.9 % IV SOLN: 500.0000 mL | INTRAVENOUS | Status: DC

## 2013-10-26 NOTE — Progress Notes (Signed)
Called to room to assist during endoscopic procedure.  Patient ID and intended procedure confirmed with present staff. Received instructions for my participation in the procedure from the performing physician.  

## 2013-10-26 NOTE — Progress Notes (Signed)
Procedure ends, to recovery, report given and VSS. 

## 2013-10-26 NOTE — Patient Instructions (Signed)

## 2013-10-26 NOTE — Op Note (Signed)
[place]  [address]. [place], [id number]   ENDOSCOPY PROCEDURE REPORT  PATIENT: Brianna Jacobs, [place]  MR#: [id number] BIRTHDATE: 1929/12/11 , 83  yrs. old GENDER: Female ENDOSCOPIST: Inda Castle, MD REFERRED BY:  Jenna Luo, M.D. PROCEDURE DATE:  10/26/2013 PROCEDURE:  EGD, diagnostic and Maloney dilation of esophagus ASA CLASS:     Class II INDICATIONS:  Dysphagia. MEDICATIONS: MAC sedation, administered by CRNA, propofol (Diprivan) 150mg  IV, and Simethicone 0.6cc PO TOPICAL ANESTHETIC:  DESCRIPTION OF PROCEDURE: After the risks benefits and alternatives of the procedure were thoroughly explained, informed consent was obtained.  The LB [id number] endoscope was introduced through the mouth and advanced to the third portion of the duodenum. Without limitations.  The instrument was slowly withdrawn as the mucosa was fully examined.      A nonobstructing stricture was seen at the GE junction.  The 9 mm gastroscope easily traversed the stricture.  There wwas mild erythema and friability at the GE junction.   The remainder of the upper endoscopy exam was otherwise normal.  Retroflexed views revealed no abnormalities.     The scope was then withdrawn from the patient.  A #52 Isabell Jarvis dilator was passed mild resistance.  There was no heme.  COMPLICATIONS: There were no complications. ENDOSCOPIC IMPRESSION: 1.   early esophageal stricture-status post Maloney dilation 2.  Esophagitis  RECOMMENDATIONS: Continue protonix  REPEAT EXAM:  eSigned:  Inda Castle, MD 10/26/2013 10:30 AM   CC:

## 2013-10-29 ENCOUNTER — Telehealth: Payer: Self-pay | Admitting: *Deleted

## 2013-10-29 NOTE — Telephone Encounter (Signed)
  Follow up Call-  Call back number 10/26/2013  Post procedure Call Back phone  # [phone]  Permission to leave phone message Yes     Patient questions:  Do you have a fever, pain , or abdominal swelling? no Pain Score  0 *  Have you tolerated food without any problems? yes  Have you been able to return to your normal activities? yes  Do you have any questions about your discharge instructions: Diet   no Medications  no Follow up visit  no  Do you have questions or concerns about your Care? no  Actions: * If pain score is 4 or above: No action needed, pain <4.

## 2013-11-06 ENCOUNTER — Other Ambulatory Visit: Payer: Self-pay | Admitting: Family Medicine

## 2013-12-25 DIAGNOSIS — M961 Postlaminectomy syndrome, not elsewhere classified: Secondary | ICD-10-CM | POA: Diagnosis not present

## 2014-01-01 ENCOUNTER — Encounter: Payer: Self-pay | Admitting: Family Medicine

## 2014-01-01 ENCOUNTER — Ambulatory Visit (INDEPENDENT_AMBULATORY_CARE_PROVIDER_SITE_OTHER): Payer: Medicare Other | Admitting: Family Medicine

## 2014-01-01 VITALS — BP 112/60 | HR 68 | Temp 98.5°F | Resp 18 | Wt 142.0 lb

## 2014-01-01 DIAGNOSIS — J209 Acute bronchitis, unspecified: Secondary | ICD-10-CM

## 2014-01-01 MED ORDER — AZITHROMYCIN 250 MG PO TABS: ORAL_TABLET | ORAL | Status: DC

## 2014-01-01 NOTE — Patient Instructions (Signed)
Use zpak start tomorrow  Mucinex DM Call if you have not any better and CXR will be ordered Continue inhaler  F/U as needed

## 2014-01-01 NOTE — Progress Notes (Signed)
Patient ID: Brianna Jacobs, female   DOB: 10/25/1929, 80 y.o.   MRN: [id number]   Subjective:    Patient ID: Brianna Jacobs, female    DOB: 03/23/1930, 80 y.o.   MRN: [id number]  Patient presents for bronchitis  patient here with concern for bronchitis. She's had bronchitis before in the past and is high risk for pneumonia. For the past 5 days she's had cough with subjective fever and production. She's had a few episodes of wheezing and has felt a little tight therefore she used albuterol inhaler which helped. She also has some sore throat from postnasal drip is using her Flonase as prescribed. She denies any current shortness of breath or chest pain. She's not had any known sick contacts    Review Of Systems:  GEN- denies fatigue, fever, weight loss,weakness, recent illness HEENT- denies eye drainage, change in vision, nasal discharge, CVS- denies chest pain, palpitations RESP- denies SOB, cough, wheeze ABD- denies N/V, change in stools, abd pain GU- denies dysuria, hematuria, dribbling, incontinence MSK- denies joint pain, muscle aches, injury Neuro- denies headache, dizziness, syncope, seizure activity       Objective:    BP 112/60  Pulse 68  Temp(Src) 98.5 F (36.9 C) (Oral)  Resp 18  Wt 142 lb (64.411 kg)  SpO2 99% GEN- NAD, alert and oriented x3 HEENT- PERRL, EOMI, non injected sclera, pink conjunctiva, MMM, oropharynx mild injection, TM clear bilat no effusion,  No  maxillary sinus tenderness, inflammed turbinates,  Nares clear Neck- Supple, no LAD CVS- RRR, no murmur RESP-bilateral congestion clears some with cough, mild rhonchi, no wheeze, normal WOB, harsh cough EXT- No edema Pulses- Radial 2+         Assessment & Plan:      Problem List Items Addressed This Visit   None      Note: This dictation was prepared with Dragon dictation along with smaller phrase technology. Any transcriptional errors that result from this process are unintentional.

## 2014-01-03 ENCOUNTER — Encounter: Payer: Self-pay | Admitting: Family Medicine

## 2014-01-22 DIAGNOSIS — Z79899 Other long term (current) drug therapy: Secondary | ICD-10-CM | POA: Diagnosis not present

## 2014-01-22 DIAGNOSIS — G894 Chronic pain syndrome: Secondary | ICD-10-CM | POA: Diagnosis not present

## 2014-01-22 DIAGNOSIS — M961 Postlaminectomy syndrome, not elsewhere classified: Secondary | ICD-10-CM | POA: Diagnosis not present

## 2014-02-01 ENCOUNTER — Ambulatory Visit (INDEPENDENT_AMBULATORY_CARE_PROVIDER_SITE_OTHER): Payer: Medicare Other | Admitting: Family Medicine

## 2014-02-01 ENCOUNTER — Encounter: Payer: Self-pay | Admitting: Family Medicine

## 2014-02-01 VITALS — BP 156/82 | HR 80 | Temp 98.3°F | Resp 16 | Ht 59.5 in | Wt 139.0 lb

## 2014-02-01 DIAGNOSIS — M48061 Spinal stenosis, lumbar region without neurogenic claudication: Secondary | ICD-10-CM

## 2014-02-01 DIAGNOSIS — E785 Hyperlipidemia, unspecified: Secondary | ICD-10-CM

## 2014-02-01 DIAGNOSIS — I10 Essential (primary) hypertension: Secondary | ICD-10-CM

## 2014-02-01 MED ORDER — CELECOXIB 200 MG PO CAPS: 200.0000 mg | ORAL_CAPSULE | Freq: Every day | ORAL | Status: DC

## 2014-02-01 NOTE — Progress Notes (Signed)
Subjective:    Patient ID: Brianna Jacobs, female    DOB: 01/08/1930, 80 y.o.   MRN: [id number]  HPI Patient has a history of lumbar spinal stenosis. She has discussed this with her pain management specialist Dr. Nelva Bush.  She has decided to discontinue OxyContin due to her age and possible side effects. At the present time she would like to take Celebrex on an as needed basis for her back pain. She also has a history of hypertension. Her blood pressure at home is well controlled. She does have an element of white coat hypertension. She is overdue for a CMP, fasting lipid panel, and CBC. She denies any chest pain shortness of breath or dyspnea on exertion. The bronchitis she suffered one month ago has completely resolved. She has no concerns today from a medical standpoint Past Medical History  Diagnosis Date  . Asthma     occassional usage of inhalers  . Seasonal allergies   . Depression   . ADD (attention deficit disorder)   . Hypertension   . Cancer     colon cancer, 16 yrs ago  . Bowel obstruction   . Neuromuscular disorder     neuropathy  . Arthritis   . Spinal stenosis   . Scoliosis   . Cataract   . GERD (gastroesophageal reflux disease)   . Osteoporosis    Past Surgical History  Procedure Laterality Date  . Sigmoid resection / rectopexy    . Colonoscopy      2 yrs ago  . Eye surgery      cataracts bilat  . Joint replacement      bilat shoulders  . Fracture surgery      thoracic spine  . Abdominal hysterectomy  1972    partial  . Spinal cord stimulator insertion  11/18/2011    Procedure: LUMBAR SPINAL CORD STIMULATOR INSERTION;  Surgeon: Dahlia Bailiff, MD;  Location: [facility];  Service: Orthopedics;  Laterality: N/A;  Thoracic 9 laminiotomy, SPINAL CORD STIMULATOR PLACEMENT thoracic 9- Thoracic 7   Current Outpatient Prescriptions on File Prior to Visit  Medication Sig Dispense Refill  . aspirin 81 MG chewable tablet Chew 81 mg by mouth every other day.      . Calcium  Carbonate-Vitamin D (CALCIUM + D) 600-200 MG-UNIT TABS Take 1 tablet by mouth daily.      . cyanocobalamin 2000 MCG tablet Take 2,000 mcg by mouth daily.      . fluticasone (FLONASE) 50 MCG/ACT nasal spray USE 2 SPRAYS IN EACH NOSTRIL ONCE DAILY  16 g  3  . losartan (COZAAR) 50 MG tablet TAKE 1 TABLET DAILY  90 tablet  0  . Melatonin 10 MG TABS Take 1 tablet by mouth at bedtime.      . Multiple Vitamin (MULITIVITAMIN WITH MINERALS) TABS Take 1 tablet by mouth daily.      . Olopatadine HCl (PATADAY) 0.2 % SOLN Place 1 drop into both eyes daily.  3 Bottle  3  . Omega-3 Fatty Acids (FISH OIL) 600 MG CAPS Take 1,200 capsules by mouth daily.       . pantoprazole (PROTONIX) 40 MG tablet Take 1 tablet (40 mg total) by mouth daily.  30 tablet  3  . PROAIR HFA 108 (90 BASE) MCG/ACT inhaler INHALE 2 PUFFS INTO THE LUNGS EVERY 6 HOURS AS NEEDED FOR SHORTNESS OF BREATH  3 each  3  . Propylene Glycol (SYSTANE BALANCE OP) Apply 1 drop to eye as needed. For  dry eyes      . ranitidine (ZANTAC) 150 MG tablet Take 1 tablet (150 mg total) by mouth 2 (two) times daily.  30 tablet  5  . WELLBUTRIN XL 150 MG 24 hr tablet TAKE 1 TABLET DAILY  90 tablet  1  . ZYRTEC ALLERGY 10 MG tablet TAKE 1 TABLET DAILY  90 tablet  3  . oxyCODONE-acetaminophen (PERCOCET) 10-325 MG per tablet Take 1 tablet by mouth every 6 (six) hours as needed. For pain       No current facility-administered medications on file prior to visit.   Allergies  Allergen Reactions  . Cymbalta [Duloxetine Hcl] Other (See Comments)    Makes me lethargic  . Lyrica [Pregabalin] Other (See Comments)    Makes me lethargic  . Ranitidine Diarrhea  . Celebrex [Celecoxib] Rash  . Naprosyn [Naproxen] Rash   History   Social History  . Marital Status: Married    Spouse Name: N/A    Number of Children: N/A  . Years of Education: N/A   Occupational History  . Not on file.   Social History Main Topics  . Smoking status: Former Smoker    Quit date:  10/11/1977  . Smokeless tobacco: Never Used  . Alcohol Use: No  . Drug Use: Yes    Special: Oxycodone  . Sexual Activity: Not on file   Other Topics Concern  . Not on file   Social History Narrative  . No narrative on file      Review of Systems  All other systems reviewed and are negative.      Objective:   Physical Exam  Vitals reviewed. Constitutional: She appears well-developed and well-nourished.  HENT:  Right Ear: External ear normal.  Left Ear: External ear normal.  Nose: Nose normal.  Mouth/Throat: Oropharynx is clear and moist.  Eyes: Conjunctivae are normal. Pupils are equal, round, and reactive to light. Right eye exhibits no discharge. Left eye exhibits no discharge. No scleral icterus.  Neck: Neck supple. No JVD present. No thyromegaly present.  Cardiovascular: Normal rate, regular rhythm and normal heart sounds.  Exam reveals no gallop and no friction rub.   No murmur heard. Pulmonary/Chest: Effort normal and breath sounds normal. No respiratory distress. She has no wheezes. She has no rales. She exhibits no tenderness.  Abdominal: Soft. Bowel sounds are normal. She exhibits no distension. There is no tenderness. There is no rebound and no guarding.  Lymphadenopathy:    She has no cervical adenopathy.          Assessment & Plan:  1. Other and unspecified hyperlipidemia Goal LDL is less than 130 - Lipid panel; Future  2. HTN (hypertension) Blood pressure today is elevated but at home is well controlled. I will make no changes in her medication at this time. I have patient return fasting for a CMP and fasting lipid panel. - CBC with Differential; Future - COMPLETE METABOLIC PANEL WITH GFR; Future  3. Spinal stenosis of lumbar region Discontinue OxyContin. Begin Celebrex 200 mg by mouth daily as needed for back pain. I warned the patient to hold Celebrex anytime she may be getting dehydrated.

## 2014-02-02 IMAGING — XA IR DG VERTEBROPLASTY FL
1 series · 14 of 24 positions shown · IV contrast (IODINE)
Comparison: CT scan of the thoracic spine of 03/07/2012, and
nuclear medicine scan of 03/22/2012.

CLINICAL DATA: Severe thoracic pain secondary to compression
fractures at T9 and T10.

VERTEBROPLASTY AT T9 AND T10

[Series 300: neuro · 14 of 45 slices shown]
[im 1/45]
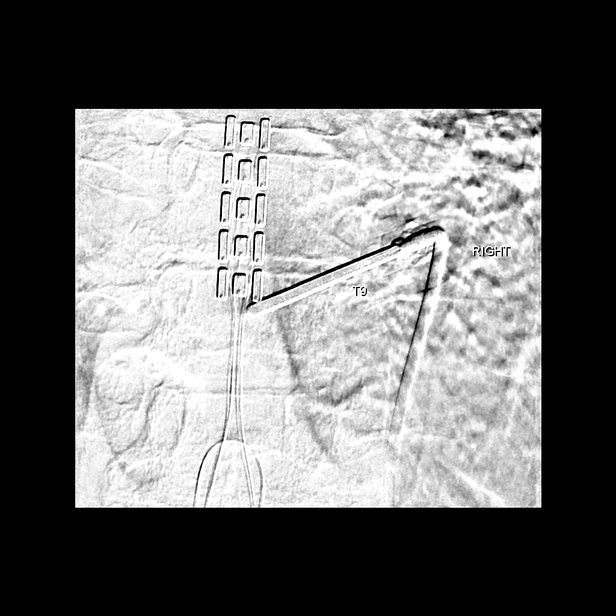
[im 4/45]
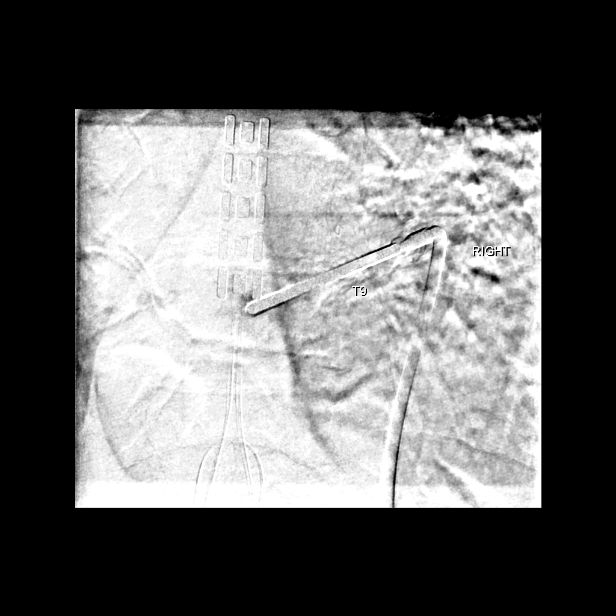
[im 8/45]
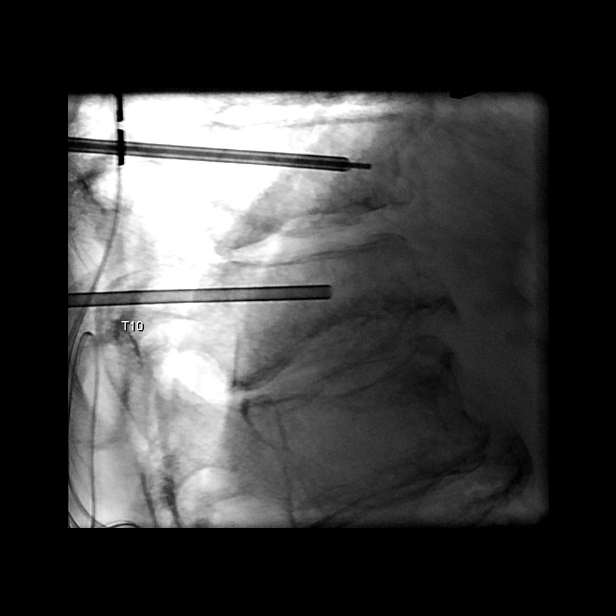
[im 12/45]
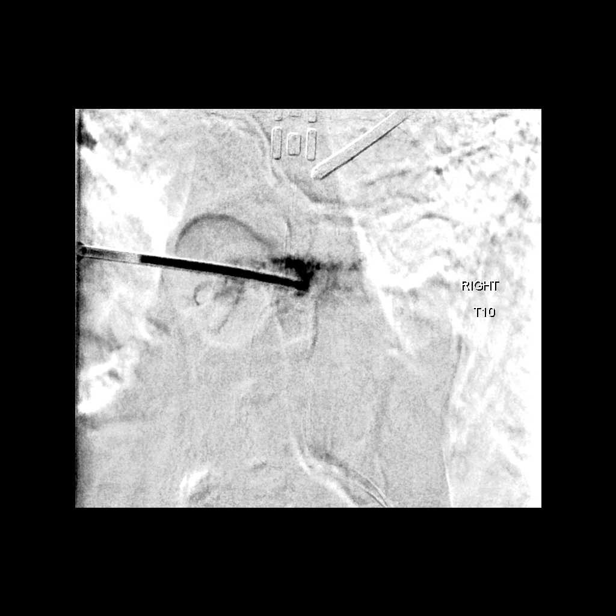
[im 14/45]
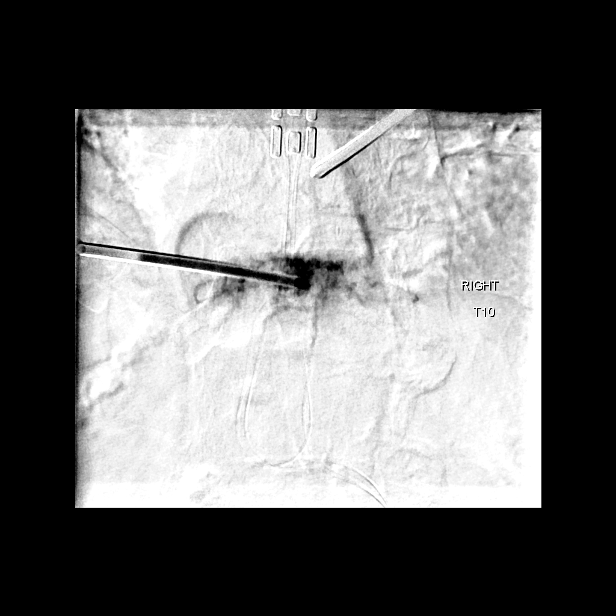
[im 18/45]
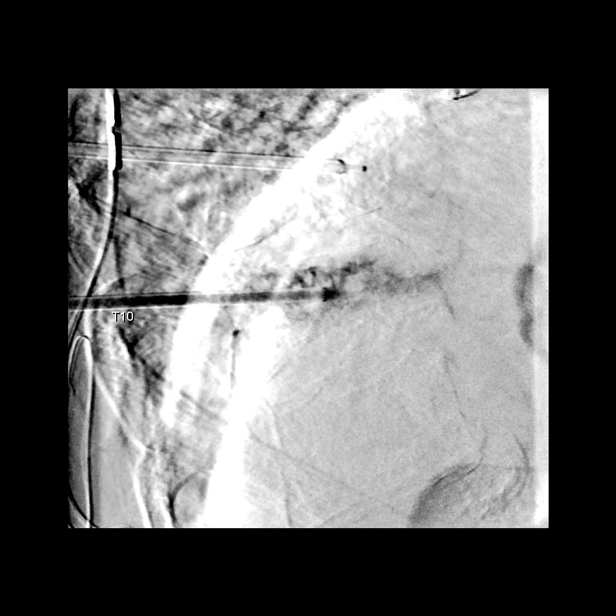
[im 22/45]
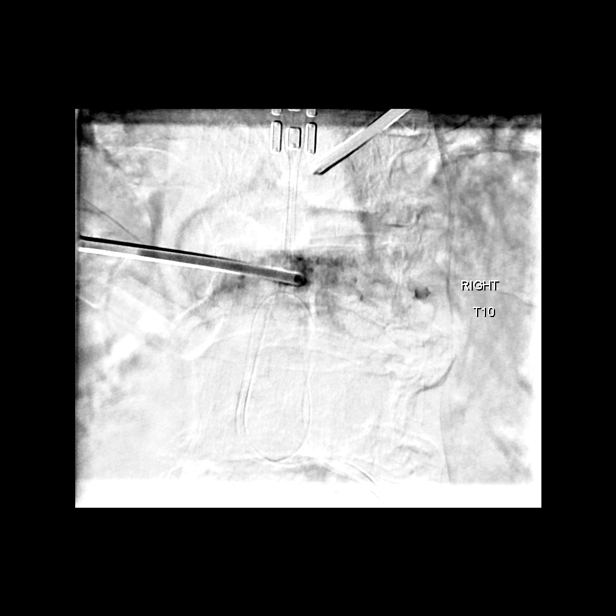
[im 23/45]
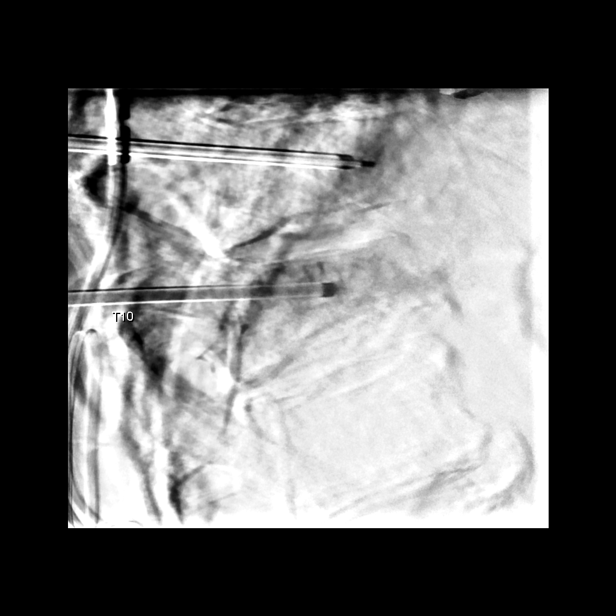
[im 27/45]
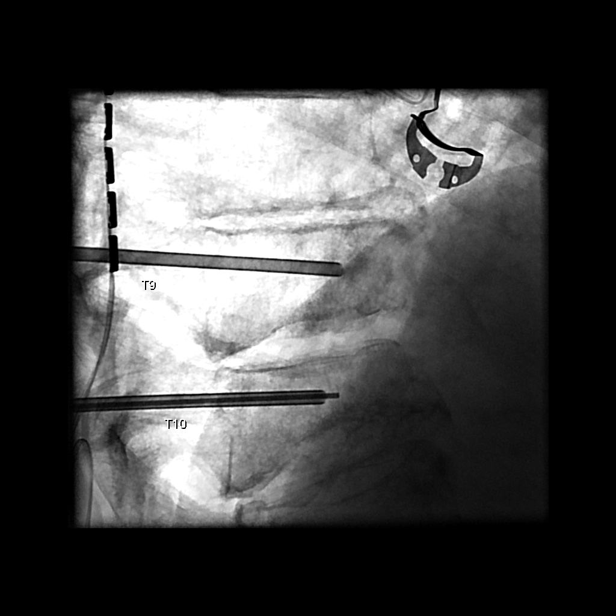
[im 31/45]
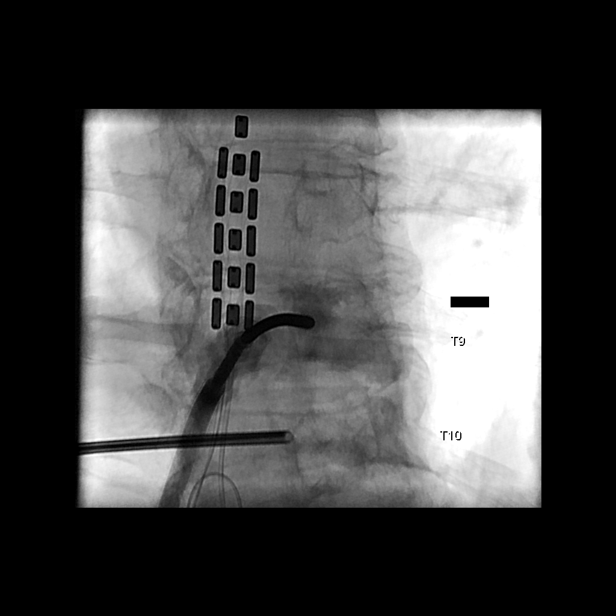
[im 35/45]
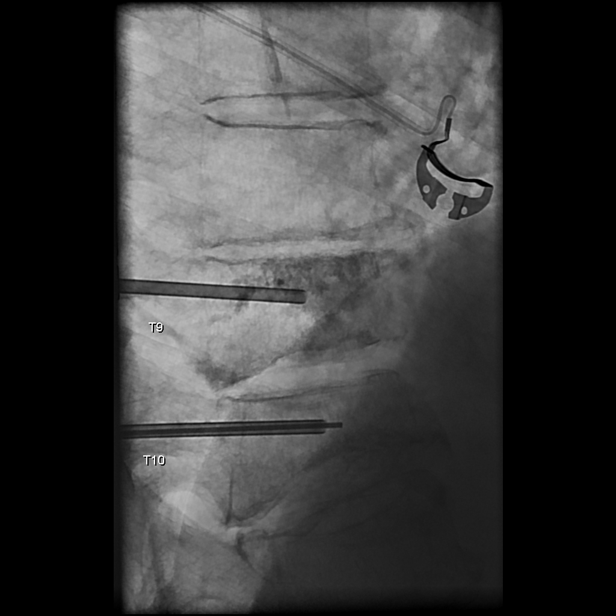
[im 37/45]
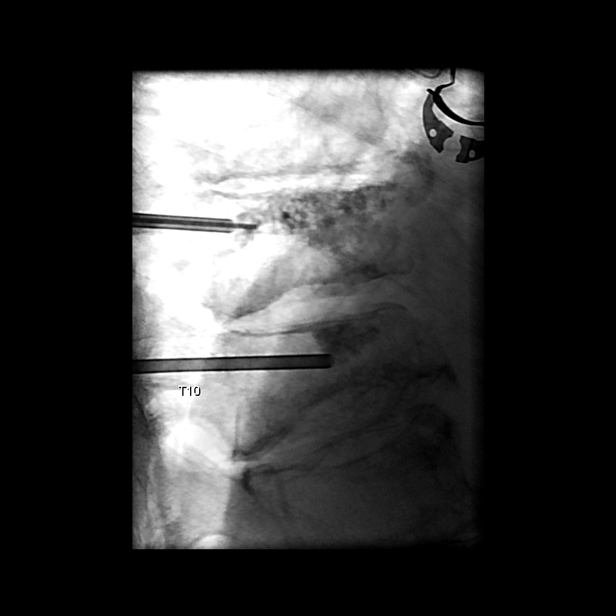
[im 41/45]
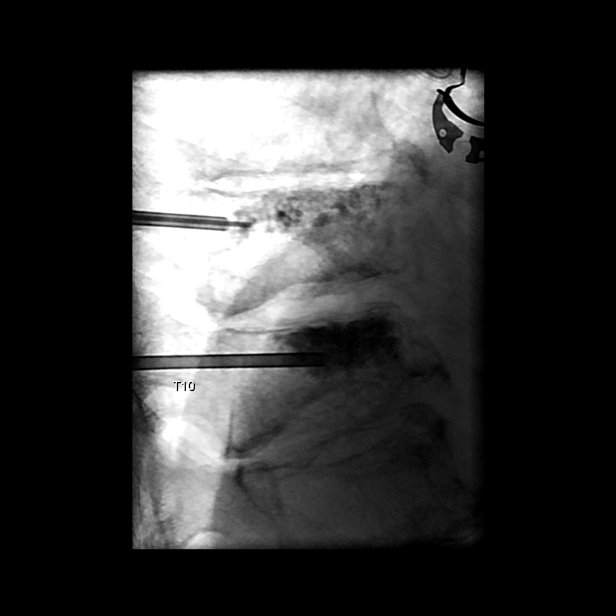
[im 45/45]
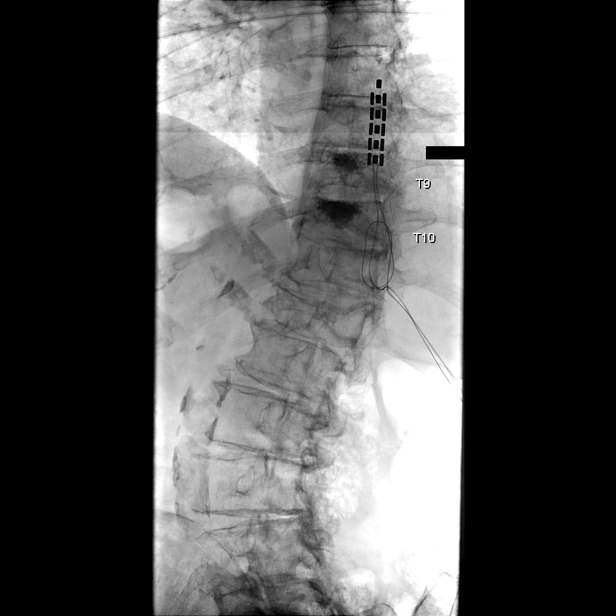

[14 of 24 positions shown; findings below may reference images not displayed]

Following a full explanation of the procedure along with the
potential associated complications, an informed witnessed consent
was obtained.

The patient was placed prone on the fluoroscopic table.

The skin overlying the thoracic region was then prepped and draped
in the usual sterile fashion.

The right pedicle at T9 and the left pedicle at T10 were then
infiltrated with 0.25% bupivacaine.  Using biplane intermittent
fluoroscopy, 13-gauge Riis spine needles were then advanced through
the anterior one-third at T9 and the junction of the anterior and
middle thirds at T10.  A gentle contrast injection demonstrated a
trabecular pattern of contrast at T9, and at T10, with early
opacification of paraspinous veins.

This necessitated the use of Gelfoam pledgets at T10.

At this time the methylmethacrylate mixture was then reconstituted
with Tobramycin in the Penny delivery device system.

Using biplane intermittent fluoroscopy, via microtubing connected
to the Riis spine needles, methylmethacrylate mixture was injected
at T9 and at T10.  Excellent filling was obtained at both levels.
There was no extension into the spinal canal or adjacent disc
spaces.

No paraspinous venous contamination was seen. The needles were then
removed.  Hemostasis was achieved at the skin entry sites.

The patient tolerated the procedure well.  There were no acute
complications.

Medications utilized: Versed 2 mg IV.  Fentanyl 50 mcg IV.

IMPRESSION

Status post fluoroscopic guided vertebral body augmentation for
painful compression fractures at T9 and T10 using the
vertebroplasty technique.

## 2014-02-04 ENCOUNTER — Other Ambulatory Visit: Payer: Self-pay | Admitting: Family Medicine

## 2014-02-08 ENCOUNTER — Other Ambulatory Visit: Payer: Medicare Other

## 2014-02-08 DIAGNOSIS — I10 Essential (primary) hypertension: Secondary | ICD-10-CM

## 2014-02-08 DIAGNOSIS — E785 Hyperlipidemia, unspecified: Secondary | ICD-10-CM | POA: Diagnosis not present

## 2014-02-08 LAB — COMPLETE METABOLIC PANEL WITH GFR
ALT: 24 U/L (ref 0–35)
AST: 25 U/L (ref 0–37)
Albumin: 4.3 g/dL (ref 3.5–5.2)
Alkaline Phosphatase: 45 U/L (ref 39–117)
BUN: 14 mg/dL (ref 6–23)
CALCIUM: 9.8 mg/dL (ref 8.4–10.5)
CHLORIDE: 103 meq/L (ref 96–112)
CO2: 26 meq/L (ref 19–32)
Creat: 0.9 mg/dL (ref 0.50–1.10)
GFR, EST AFRICAN AMERICAN: 69 mL/min
GFR, Est Non African American: 60 mL/min
GLUCOSE: 92 mg/dL (ref 70–99)
POTASSIUM: 4.8 meq/L (ref 3.5–5.3)
SODIUM: 140 meq/L (ref 135–145)
TOTAL PROTEIN: 6.7 g/dL (ref 6.0–8.3)
Total Bilirubin: 0.5 mg/dL (ref 0.2–1.2)

## 2014-02-08 LAB — CBC WITH DIFFERENTIAL/PLATELET
Basophils Absolute: 0 10*3/uL (ref 0.0–0.1)
Basophils Relative: 0 % (ref 0–1)
Eosinophils Absolute: 0.1 10*3/uL (ref 0.0–0.7)
Eosinophils Relative: 2 % (ref 0–5)
HEMATOCRIT: 40.9 % (ref 36.0–46.0)
HEMOGLOBIN: 13.8 g/dL (ref 12.0–15.0)
LYMPHS ABS: 1.5 10*3/uL (ref 0.7–4.0)
Lymphocytes Relative: 21 % (ref 12–46)
MCH: 30.9 pg (ref 26.0–34.0)
MCHC: 33.7 g/dL (ref 30.0–36.0)
MCV: 91.5 fL (ref 78.0–100.0)
MONO ABS: 0.4 10*3/uL (ref 0.1–1.0)
MONOS PCT: 6 % (ref 3–12)
NEUTROS ABS: 5 10*3/uL (ref 1.7–7.7)
Neutrophils Relative %: 71 % (ref 43–77)
Platelets: 369 10*3/uL (ref 150–400)
RBC: 4.47 MIL/uL (ref 3.87–5.11)
RDW: 14.6 % (ref 11.5–15.5)
WBC: 7.1 10*3/uL (ref 4.0–10.5)

## 2014-02-08 LAB — LIPID PANEL
CHOL/HDL RATIO: 2.8 ratio
Cholesterol: 225 mg/dL — ABNORMAL HIGH (ref 0–200)
HDL: 80 mg/dL (ref >39)
LDL Cholesterol: 131 mg/dL — ABNORMAL HIGH (ref 0–99)
Triglycerides: 69 mg/dL (ref <150)
VLDL: 14 mg/dL (ref 0–40)

## 2014-02-14 ENCOUNTER — Telehealth: Payer: Self-pay | Admitting: *Deleted

## 2014-02-14 NOTE — Telephone Encounter (Signed)
Please temporarily discontinue Celebrex, if rash persists after 2 or 3 days come in next week so I can evaluate the rash.

## 2014-02-14 NOTE — Telephone Encounter (Signed)
Call placed to patient and patient made aware.  

## 2014-02-14 NOTE — Telephone Encounter (Signed)
Call placed to patient to make aware of labs.   During conversation, patient stated that she has new skin irritation noted to arms, chest, abdomen and back, ans has noted that this irritation x1 week.   States that skin irritation is red with slightly bumpy areas. Denies itching or pain. Denies itchy throat or difficulty breathing.   States that she has begun taking Celebrex on 02/01/2014, and that is the only new medication that she has begun.   MD please advise.

## 2014-04-04 ENCOUNTER — Telehealth: Payer: Self-pay | Admitting: *Deleted

## 2014-04-04 ENCOUNTER — Other Ambulatory Visit: Payer: Self-pay | Admitting: Family Medicine

## 2014-04-04 DIAGNOSIS — K219 Gastro-esophageal reflux disease without esophagitis: Secondary | ICD-10-CM

## 2014-04-04 MED ORDER — CELECOXIB 200 MG PO CAPS: 200.0000 mg | ORAL_CAPSULE | Freq: Every day | ORAL | Status: DC

## 2014-04-04 MED ORDER — PANTOPRAZOLE SODIUM 40 MG PO TBEC: 40.0000 mg | DELAYED_RELEASE_TABLET | Freq: Every day | ORAL | Status: DC

## 2014-04-04 NOTE — Telephone Encounter (Signed)
Refill appropriate and filled per protocol. 

## 2014-04-05 ENCOUNTER — Other Ambulatory Visit: Payer: Self-pay | Admitting: Family Medicine

## 2014-04-05 MED ORDER — LOSARTAN POTASSIUM 50 MG PO TABS: ORAL_TABLET | ORAL | Status: DC

## 2014-04-05 MED ORDER — CETIRIZINE HCL 10 MG PO TABS: ORAL_TABLET | ORAL | Status: DC

## 2014-04-05 MED ORDER — CHLORHEXIDINE GLUCONATE 0.12 % MT SOLN: 15.0000 mL | Freq: Two times a day (BID) | OROMUCOSAL | Status: DC | PRN

## 2014-04-05 MED ORDER — CELECOXIB 200 MG PO CAPS: 200.0000 mg | ORAL_CAPSULE | Freq: Every day | ORAL | Status: DC

## 2014-04-05 NOTE — Telephone Encounter (Signed)
Med sent to pharm 

## 2014-05-16 DIAGNOSIS — H1045 Other chronic allergic conjunctivitis: Secondary | ICD-10-CM | POA: Diagnosis not present

## 2014-05-16 DIAGNOSIS — H04129 Dry eye syndrome of unspecified lacrimal gland: Secondary | ICD-10-CM | POA: Diagnosis not present

## 2014-05-27 ENCOUNTER — Encounter: Payer: Self-pay | Admitting: Family Medicine

## 2014-05-27 ENCOUNTER — Ambulatory Visit (INDEPENDENT_AMBULATORY_CARE_PROVIDER_SITE_OTHER): Payer: Medicare Other | Admitting: Family Medicine

## 2014-05-27 VITALS — BP 120/58 | HR 82 | Temp 98.8°F | Resp 16 | Ht 59.5 in | Wt 147.0 lb

## 2014-05-27 DIAGNOSIS — K5289 Other specified noninfective gastroenteritis and colitis: Secondary | ICD-10-CM

## 2014-05-27 DIAGNOSIS — K529 Noninfective gastroenteritis and colitis, unspecified: Secondary | ICD-10-CM

## 2014-05-27 NOTE — Progress Notes (Signed)
Subjective:    Patient ID: Brianna Jacobs, female    DOB: 08/09/1930, 80 y.o.   MRN: [id number]  HPI Patient has a history of chronic colitis which is intermittent. Report, it sounds as though the patient has been diagnosed with lymphocytic colitis. In the past he has been able to control the diarrhea using glycopyrrolate. 3 weeks the diarrhea has presented. She is having loose bowel movements frequently throughout the day. She denies any melena. She denies any hematochezia. She denies any fever. She denies any weight loss.  Around the same time, the patient also discontinued her narcotics. This may also contribute to her diarrhea. Past Medical History  Diagnosis Date  . Asthma     occassional usage of inhalers  . Seasonal allergies   . Depression   . ADD (attention deficit disorder)   . Hypertension   . Cancer     colon cancer, 16 yrs ago  . Bowel obstruction   . Neuromuscular disorder     neuropathy  . Arthritis   . Spinal stenosis   . Scoliosis   . Cataract   . GERD (gastroesophageal reflux disease)   . Osteoporosis    Past Surgical History  Procedure Laterality Date  . Sigmoid resection / rectopexy    . Colonoscopy      2 yrs ago  . Eye surgery      cataracts bilat  . Joint replacement      bilat shoulders  . Fracture surgery      thoracic spine  . Abdominal hysterectomy  1972    partial  . Spinal cord stimulator insertion  11/18/2011    Procedure: LUMBAR SPINAL CORD STIMULATOR INSERTION;  Surgeon: Dahlia Bailiff, MD;  Location: [facility];  Service: Orthopedics;  Laterality: N/A;  Thoracic 9 laminiotomy, SPINAL CORD STIMULATOR PLACEMENT thoracic 9- Thoracic 7   Current Outpatient Prescriptions on File Prior to Visit  Medication Sig Dispense Refill  . aspirin 81 MG chewable tablet Chew 81 mg by mouth every other day.      Marland Kitchen buPROPion (WELLBUTRIN XL) 150 MG 24 hr tablet TAKE 1 TABLET DAILY  90 tablet  4  . Calcium Carbonate-Vitamin D (CALCIUM + D) 600-200 MG-UNIT TABS Take  1 tablet by mouth daily.      . celecoxib (CELEBREX) 200 MG capsule Take 1 capsule (200 mg total) by mouth daily.  30 capsule  1  . cetirizine (ZYRTEC ALLERGY) 10 MG tablet TAKE 1 TABLET DAILY  30 tablet  1  . chlorhexidine (PERIDEX) 0.12 % solution Use as directed 15 mLs in the mouth or throat 2 (two) times daily as needed.  120 mL  0  . cyanocobalamin 2000 MCG tablet Take 2,000 mcg by mouth daily.      . fluticasone (FLONASE) 50 MCG/ACT nasal spray USE 2 SPRAYS IN EACH NOSTRIL ONCE DAILY  16 g  3  . losartan (COZAAR) 50 MG tablet TAKE 1 TABLET DAILY  30 tablet  1  . Melatonin 10 MG TABS Take 1 tablet by mouth at bedtime.      . Multiple Vitamin (MULITIVITAMIN WITH MINERALS) TABS Take 1 tablet by mouth daily.      . Omega-3 Fatty Acids (FISH OIL) 600 MG CAPS Take 1,200 capsules by mouth daily.       Marland Kitchen oxyCODONE-acetaminophen (PERCOCET) 10-325 MG per tablet Take 1 tablet by mouth every 6 (six) hours as needed. For pain      . pantoprazole (PROTONIX) 40 MG tablet  Take 1 tablet (40 mg total) by mouth daily.  90 tablet  1  . PATADAY 0.2 % SOLN INSTILL 1 DROP IN BOTH EYES DAILY  3 Bottle  2  . PROAIR HFA 108 (90 BASE) MCG/ACT inhaler INHALE 2 PUFFS INTO THE LUNGS EVERY 6 HOURS AS NEEDED FOR SHORTNESS OF BREATH  3 each  3  . Propylene Glycol (SYSTANE BALANCE OP) Apply 1 drop to eye as needed. For dry eyes      . ranitidine (ZANTAC) 150 MG tablet Take 1 tablet (150 mg total) by mouth 2 (two) times daily.  30 tablet  5   No current facility-administered medications on file prior to visit.   Allergies  Allergen Reactions  . Cymbalta [Duloxetine Hcl] Other (See Comments)    Makes me lethargic  . Lyrica [Pregabalin] Other (See Comments)    Makes me lethargic  . Ranitidine Diarrhea  . Celebrex [Celecoxib] Rash  . Naprosyn [Naproxen] Rash   History   Social History  . Marital Status: Married    Spouse Name: N/A    Number of Children: N/A  . Years of Education: N/A   Occupational History  .  Not on file.   Social History Main Topics  . Smoking status: Former Smoker    Quit date: 10/11/1977  . Smokeless tobacco: Never Used  . Alcohol Use: No  . Drug Use: Yes    Special: Oxycodone  . Sexual Activity: Not on file   Other Topics Concern  . Not on file   Social History Narrative  . No narrative on file      Review of Systems  All other systems reviewed and are negative.      Objective:   Physical Exam  Vitals reviewed. Cardiovascular: Normal rate, regular rhythm and normal heart sounds.   No murmur heard. Pulmonary/Chest: Effort normal and breath sounds normal. No respiratory distress. She has no wheezes. She has no rales.  Abdominal: Soft. Bowel sounds are normal. She exhibits no distension and no mass. There is no tenderness. There is no rebound and no guarding.          Assessment & Plan:  Diagnosis is colitis. I believe the patient has lymphocytic colitis. I recommended that she try Imodium or Pepto-Bismol 2 times a day. Recheck in 1-2 weeks. If symptoms persist consider glycopyrrolate. He can also try cholestyramine. If symptoms persist beyond that, I recommended GI consultation

## 2014-06-21 ENCOUNTER — Telehealth: Payer: Self-pay | Admitting: Family Medicine

## 2014-06-21 NOTE — Telephone Encounter (Signed)
Patient is calling to talk with you about an issue would not say why but wanted to get call back today if possible  [phone]

## 2014-06-24 MED ORDER — CHLORHEXIDINE GLUCONATE 0.12 % MT SOLN: 15.0000 mL | Freq: Two times a day (BID) | OROMUCOSAL | Status: DC | PRN

## 2014-06-24 MED ORDER — CARBOXYMETHYLCELLUL-GLYCERIN 0.5-0.9 % OP SOLN: 1.0000 [drp] | Freq: Three times a day (TID) | OPHTHALMIC | Status: DC

## 2014-06-24 NOTE — Telephone Encounter (Signed)
Pt states that her diarrhea is back. The imodium did help and it stopped for a while but has now returned with some mild abd pain and is wondering what is the next step to get this resolved? She goes 4 times per day, normal color - not black and very watery.

## 2014-06-24 NOTE — Telephone Encounter (Signed)
Patient aware.

## 2014-06-24 NOTE — Telephone Encounter (Signed)
If she is still taking imodium she could add one peto bismol tab twice a day, and if no better, she should contact her GI.

## 2014-06-28 ENCOUNTER — Other Ambulatory Visit: Payer: Self-pay | Admitting: Family Medicine

## 2014-07-16 ENCOUNTER — Ambulatory Visit: Payer: Medicare Other

## 2014-07-16 ENCOUNTER — Telehealth: Payer: Self-pay | Admitting: Family Medicine

## 2014-07-16 ENCOUNTER — Encounter: Payer: Self-pay | Admitting: Family Medicine

## 2014-07-16 ENCOUNTER — Ambulatory Visit (INDEPENDENT_AMBULATORY_CARE_PROVIDER_SITE_OTHER): Payer: Medicare Other | Admitting: Family Medicine

## 2014-07-16 VITALS — BP 160/88 | HR 68 | Temp 99.0°F | Resp 20 | Ht 59.5 in | Wt 150.0 lb

## 2014-07-16 DIAGNOSIS — J208 Acute bronchitis due to other specified organisms: Secondary | ICD-10-CM | POA: Diagnosis not present

## 2014-07-16 MED ORDER — AZITHROMYCIN 250 MG PO TABS: ORAL_TABLET | ORAL | Status: DC

## 2014-07-16 NOTE — Progress Notes (Signed)
Subjective:    Patient ID: Brianna Jacobs, female    DOB: Mar 27, 1930, 80 y.o.   MRN: [id number]  HPI  Patient is here for cough. She's had a four-day history of severe cough. It is steadily worsening. She is now low-grade fevers of 99-100. The cough is productive of green sputum. She denies any shortness of breath or chest pain. On examination today there are no crackles in the lung. However the patient does have rhonchorous breath sounds suggesting mucus in the airways. She denies any otalgia, sore throat, rhinorrhea. Past Medical History  Diagnosis Date  . Asthma     occassional usage of inhalers  . Seasonal allergies   . Depression   . ADD (attention deficit disorder)   . Hypertension   . Cancer     colon cancer, 16 yrs ago  . Bowel obstruction   . Neuromuscular disorder     neuropathy  . Arthritis   . Spinal stenosis   . Scoliosis   . Cataract   . GERD (gastroesophageal reflux disease)   . Osteoporosis    Past Surgical History  Procedure Laterality Date  . Sigmoid resection / rectopexy    . Colonoscopy      2 yrs ago  . Eye surgery      cataracts bilat  . Joint replacement      bilat shoulders  . Fracture surgery      thoracic spine  . Abdominal hysterectomy  1972    partial  . Spinal cord stimulator insertion  11/18/2011    Procedure: LUMBAR SPINAL CORD STIMULATOR INSERTION;  Surgeon: Dahlia Bailiff, MD;  Location: [facility];  Service: Orthopedics;  Laterality: N/A;  Thoracic 9 laminiotomy, SPINAL CORD STIMULATOR PLACEMENT thoracic 9- Thoracic 7   Current Outpatient Prescriptions on File Prior to Visit  Medication Sig Dispense Refill  . aspirin 81 MG chewable tablet Chew 81 mg by mouth every other day.      Marland Kitchen buPROPion (WELLBUTRIN XL) 150 MG 24 hr tablet TAKE 1 TABLET DAILY  90 tablet  4  . Calcium Carbonate-Vitamin D (CALCIUM + D) 600-200 MG-UNIT TABS Take 1 tablet by mouth daily.      . Carboxymethylcellul-Glycerin (OPTIVE SENSITIVE) 0.5-0.9 % SOLN Apply 1 drop to  eye 3 (three) times daily.  45 mL  3  . celecoxib (CELEBREX) 200 MG capsule Take 1 capsule (200 mg total) by mouth daily.  30 capsule  1  . cetirizine (ZYRTEC ALLERGY) 10 MG tablet TAKE 1 TABLET DAILY  30 tablet  1  . chlorhexidine (PERIDEX) 0.12 % solution Use as directed 15 mLs in the mouth or throat 2 (two) times daily as needed.  480 mL  3  . cyanocobalamin 2000 MCG tablet Take 2,000 mcg by mouth daily.      . fluticasone (FLONASE) 50 MCG/ACT nasal spray USE 2 SPRAYS IN EACH NOSTRIL ONCE DAILY  16 g  3  . losartan (COZAAR) 50 MG tablet TAKE 1 TABLET DAILY  30 tablet  1  . Melatonin 10 MG TABS Take 1 tablet by mouth at bedtime.      . Multiple Vitamin (MULITIVITAMIN WITH MINERALS) TABS Take 1 tablet by mouth daily.      . Omega-3 Fatty Acids (FISH OIL) 600 MG CAPS Take 1,200 capsules by mouth daily.       Marland Kitchen oxyCODONE-acetaminophen (PERCOCET) 10-325 MG per tablet Take 1 tablet by mouth every 6 (six) hours as needed. For pain      .  pantoprazole (PROTONIX) 40 MG tablet Take 1 tablet (40 mg total) by mouth daily.  90 tablet  1  . PATADAY 0.2 % SOLN INSTILL 1 DROP IN BOTH EYES DAILY  3 Bottle  2  . PROAIR HFA 108 (90 BASE) MCG/ACT inhaler INHALE 2 PUFFS INTO THE LUNGS EVERY 6 HOURS AS NEEDED FOR SHORTNESS OF BREATH  3 each  3  . Propylene Glycol (SYSTANE BALANCE OP) Apply 1 drop to eye as needed. For dry eyes      . ranitidine (ZANTAC) 150 MG tablet Take 1 tablet (150 mg total) by mouth 2 (two) times daily.  30 tablet  5  . ZYRTEC ALLERGY 10 MG tablet TAKE 1 TABLET DAILY  90 tablet  3   No current facility-administered medications on file prior to visit.   Allergies  Allergen Reactions  . Cymbalta [Duloxetine Hcl] Other (See Comments)    Makes me lethargic  . Lyrica [Pregabalin] Other (See Comments)    Makes me lethargic  . Ranitidine Diarrhea  . Celebrex [Celecoxib] Rash  . Naprosyn [Naproxen] Rash   History   Social History  . Marital Status: Married    Spouse Name: N/A    Number  of Children: N/A  . Years of Education: N/A   Occupational History  . Not on file.   Social History Main Topics  . Smoking status: Former Smoker    Quit date: 10/11/1977  . Smokeless tobacco: Never Used  . Alcohol Use: No  . Drug Use: Yes    Special: Oxycodone  . Sexual Activity: Not on file   Other Topics Concern  . Not on file   Social History Narrative  . No narrative on file     Review of Systems  All other systems reviewed and are negative.      Objective:   Physical Exam  Vitals reviewed. Constitutional: She appears well-developed and well-nourished.  HENT:  Right Ear: External ear normal.  Left Ear: External ear normal.  Nose: Nose normal.  Mouth/Throat: Oropharynx is clear and moist. No oropharyngeal exudate.  Eyes: Conjunctivae are normal. Pupils are equal, round, and reactive to light.  Neck: Neck supple.  Cardiovascular: Normal rate, regular rhythm and normal heart sounds.   No murmur heard. Pulmonary/Chest: Effort normal. She has rales.  Abdominal: Soft. Bowel sounds are normal.  Lymphadenopathy:    She has no cervical adenopathy.          Assessment & Plan:  Acute bronchitis due to other specified organisms - Plan: azithromycin (ZITHROMAX) 250 MG tablet   Begin Zithromax 500 mg by mouth today and 250 mg poqday 2-5.  Recheck in 48 hours if no better or immediately if worse.

## 2014-07-16 NOTE — Telephone Encounter (Signed)
Patient says she is coming in for a flu shot this afternoon and thinks she has bronchitis would like to know if she can still do this and insist's on having an appointment for the sickness as well, I explained that we did not have any thing this afternoon, and she said she would talk to you about it. [phone]

## 2014-07-16 NOTE — Telephone Encounter (Signed)
Spoke to pt and she is having a lot of coughing and chest congestion. Per [facility] ok to work her in this afternoon. Appt made and pt aware.

## 2014-08-01 DIAGNOSIS — Z1231 Encounter for screening mammogram for malignant neoplasm of breast: Secondary | ICD-10-CM | POA: Diagnosis not present

## 2014-08-01 DIAGNOSIS — Z803 Family history of malignant neoplasm of breast: Secondary | ICD-10-CM | POA: Diagnosis not present

## 2014-08-02 ENCOUNTER — Telehealth: Payer: Self-pay | Admitting: Family Medicine

## 2014-08-02 NOTE — Telephone Encounter (Signed)
Pt was concerned about getting the flu shot cause she is still coughing. Informed pt that as long as she is not running a fever or taking prednisone then she is fine to get a flu shot.

## 2014-08-02 NOTE — Telephone Encounter (Signed)
Patient would like to speak with you regarding whether or not she should get a flu shot  [phone]

## 2014-08-05 ENCOUNTER — Telehealth: Payer: Self-pay | Admitting: *Deleted

## 2014-08-05 ENCOUNTER — Ambulatory Visit: Payer: Medicare Other

## 2014-08-05 NOTE — Telephone Encounter (Signed)
Pt came in today to receive flu shot and pt c/o having symptoms of cough and fever, I checked the pt tempature and it was 99.5 advised pt could not do flu shot today d/t fever and cough, pt wants to know if she can get something for cough, states hurts when she coughs around her back and radiates to her chest. MD please advise.  [place]

## 2014-08-06 ENCOUNTER — Other Ambulatory Visit: Payer: Self-pay | Admitting: Family Medicine

## 2014-08-06 DIAGNOSIS — K219 Gastro-esophageal reflux disease without esophagitis: Secondary | ICD-10-CM

## 2014-08-06 MED ORDER — BENZONATATE 200 MG PO CAPS: 200.0000 mg | ORAL_CAPSULE | Freq: Three times a day (TID) | ORAL | Status: DC | PRN

## 2014-08-06 MED ORDER — RANITIDINE HCL 150 MG PO TABS: 150.0000 mg | ORAL_TABLET | Freq: Two times a day (BID) | ORAL | Status: DC

## 2014-08-06 NOTE — Telephone Encounter (Signed)
Med sent to pharm 

## 2014-08-06 NOTE — Telephone Encounter (Signed)
Script sent to pharmacy and pt aware. 

## 2014-08-06 NOTE — Telephone Encounter (Signed)
Tessalon perles 200 mg po q 8 hrs prn cough.  30, ntbs if worse.

## 2014-08-14 ENCOUNTER — Encounter: Payer: Self-pay | Admitting: Family Medicine

## 2014-08-21 ENCOUNTER — Telehealth: Payer: Self-pay | Admitting: Family Medicine

## 2014-08-21 NOTE — Telephone Encounter (Signed)
[phone]  PT is wanting to speak to you about the cough that she still has.

## 2014-08-21 NOTE — Telephone Encounter (Signed)
Pt states that the deep cough she was having is better not coughing up the yellow/green mucous that she was before however she still has a dry hackey cough and now she is having pain in her left side below her breast area and she is just not sure what is going on with that. She is having no "chest pain" or irregular SOB more then usual. Made pt an appt to f/u with Dr. Dennard Schaumann.

## 2014-08-23 ENCOUNTER — Ambulatory Visit
Admission: RE | Admit: 2014-08-23 | Discharge: 2014-08-23 | Disposition: A | Discharge status: Home / Self Care | Payer: Medicare Other | Source: Ambulatory Visit | Attending: Family Medicine | Admitting: Family Medicine

## 2014-08-23 ENCOUNTER — Ambulatory Visit (INDEPENDENT_AMBULATORY_CARE_PROVIDER_SITE_OTHER): Payer: Medicare Other | Admitting: Family Medicine

## 2014-08-23 ENCOUNTER — Encounter: Payer: Self-pay | Admitting: Family Medicine

## 2014-08-23 VITALS — BP 110/68 | HR 68 | Temp 98.2°F | Resp 18 | Ht 59.5 in | Wt 148.0 lb

## 2014-08-23 DIAGNOSIS — R05 Cough: Secondary | ICD-10-CM

## 2014-08-23 DIAGNOSIS — R091 Pleurisy: Secondary | ICD-10-CM | POA: Diagnosis not present

## 2014-08-23 DIAGNOSIS — R059 Cough, unspecified: Secondary | ICD-10-CM

## 2014-08-23 DIAGNOSIS — J40 Bronchitis, not specified as acute or chronic: Secondary | ICD-10-CM | POA: Diagnosis not present

## 2014-08-23 DIAGNOSIS — Z9889 Other specified postprocedural states: Secondary | ICD-10-CM | POA: Diagnosis not present

## 2014-08-23 DIAGNOSIS — R0789 Other chest pain: Secondary | ICD-10-CM | POA: Diagnosis not present

## 2014-08-23 NOTE — Progress Notes (Signed)
Subjective:    Patient ID: Brianna Jacobs, female    DOB: Jun 16, 1930, 80 y.o.   MRN: [id number]  HPI  I saw the patient October 6 after she developed bronchitis. She had a cough productive of green thick purulent sputum. I started the patient on a Z-Pak. The sick purulent sputum and severe deep cough improved shortly thereafter. However for the last 2-3 weeks she continues to have a mild dry cough which appears to be confined to her upper airways. She also reports postnasal drip and constant irritation from her sinuses which seems to be contributing to the cough. She is mainly concerned because she has developed left-sided pleurisy. She has severe pain whenever she takes a deep breath or coughs hard in her left side around her lower rib just overlying the stomach radiating into the axillary line. She denies any hemoptysis. She denies any fevers or chills or weight loss.  Past Medical History  Diagnosis Date  . Asthma     occassional usage of inhalers  . Seasonal allergies   . Depression   . ADD (attention deficit disorder)   . Hypertension   . Cancer     colon cancer, 16 yrs ago  . Bowel obstruction   . Neuromuscular disorder     neuropathy  . Arthritis   . Spinal stenosis   . Scoliosis   . Cataract   . GERD (gastroesophageal reflux disease)   . Osteoporosis    Past Surgical History  Procedure Laterality Date  . Sigmoid resection / rectopexy    . Colonoscopy      2 yrs ago  . Eye surgery      cataracts bilat  . Joint replacement      bilat shoulders  . Fracture surgery      thoracic spine  . Abdominal hysterectomy  1972    partial  . Spinal cord stimulator insertion  11/18/2011    Procedure: LUMBAR SPINAL CORD STIMULATOR INSERTION;  Surgeon: Dahlia Bailiff, MD;  Location: [facility];  Service: Orthopedics;  Laterality: N/A;  Thoracic 9 laminiotomy, SPINAL CORD STIMULATOR PLACEMENT thoracic 9- Thoracic 7   Current Outpatient Prescriptions on File Prior to Visit  Medication Sig  Dispense Refill  . aspirin 81 MG chewable tablet Chew 81 mg by mouth every other day.    Marland Kitchen buPROPion (WELLBUTRIN XL) 150 MG 24 hr tablet TAKE 1 TABLET DAILY 90 tablet 4  . Calcium Carbonate-Vitamin D (CALCIUM + D) 600-200 MG-UNIT TABS Take 1 tablet by mouth daily.    . Carboxymethylcellul-Glycerin (OPTIVE SENSITIVE) 0.5-0.9 % SOLN Apply 1 drop to eye 3 (three) times daily. 45 mL 3  . celecoxib (CELEBREX) 200 MG capsule Take 1 capsule (200 mg total) by mouth daily. 30 capsule 1  . cetirizine (ZYRTEC ALLERGY) 10 MG tablet TAKE 1 TABLET DAILY 30 tablet 1  . chlorhexidine (PERIDEX) 0.12 % solution Use as directed 15 mLs in the mouth or throat 2 (two) times daily as needed. 480 mL 3  . cyanocobalamin 2000 MCG tablet Take 2,000 mcg by mouth daily.    . fluticasone (FLONASE) 50 MCG/ACT nasal spray USE 2 SPRAYS IN EACH NOSTRIL ONCE DAILY 16 g 3  . losartan (COZAAR) 50 MG tablet TAKE 1 TABLET DAILY 30 tablet 1  . Melatonin 10 MG TABS Take 1 tablet by mouth at bedtime.    . Multiple Vitamin (MULITIVITAMIN WITH MINERALS) TABS Take 1 tablet by mouth daily.    . Omega-3 Fatty Acids (FISH OIL)  600 MG CAPS Take 1,200 capsules by mouth daily.     Marland Kitchen oxyCODONE-acetaminophen (PERCOCET) 10-325 MG per tablet Take 1 tablet by mouth every 6 (six) hours as needed. For pain    . pantoprazole (PROTONIX) 40 MG tablet Take 1 tablet (40 mg total) by mouth daily. 90 tablet 1  . PATADAY 0.2 % SOLN INSTILL 1 DROP IN BOTH EYES DAILY 3 Bottle 2  . PROAIR HFA 108 (90 BASE) MCG/ACT inhaler INHALE 2 PUFFS INTO THE LUNGS EVERY 6 HOURS AS NEEDED FOR SHORTNESS OF BREATH 3 each 3  . Propylene Glycol (SYSTANE BALANCE OP) Apply 1 drop to eye as needed. For dry eyes    . ranitidine (ZANTAC) 150 MG tablet Take 1 tablet (150 mg total) by mouth 2 (two) times daily. 180 tablet 3  . ZYRTEC ALLERGY 10 MG tablet TAKE 1 TABLET DAILY 90 tablet 3   No current facility-administered medications on file prior to visit.   Allergies  Allergen  Reactions  . Cymbalta [Duloxetine Hcl] Other (See Comments)    Makes me lethargic  . Lyrica [Pregabalin] Other (See Comments)    Makes me lethargic  . Ranitidine Diarrhea  . Celebrex [Celecoxib] Rash  . Naprosyn [Naproxen] Rash   History   Social History  . Marital Status: Married    Spouse Name: N/A    Number of Children: N/A  . Years of Education: N/A   Occupational History  . Not on file.   Social History Main Topics  . Smoking status: Former Smoker    Quit date: 10/11/1977  . Smokeless tobacco: Never Used  . Alcohol Use: No  . Drug Use: Yes    Special: Oxycodone  . Sexual Activity: Not on file   Other Topics Concern  . Not on file   Social History Narrative     Review of Systems  All other systems reviewed and are negative.      Objective:   Physical Exam  HENT:  Right Ear: External ear normal.  Left Ear: External ear normal.  Nose: Nose normal.  Mouth/Throat: Oropharynx is clear and moist. No oropharyngeal exudate.  Neck: Neck supple.  Cardiovascular: Normal rate, regular rhythm and normal heart sounds.   Pulmonary/Chest: Effort normal and breath sounds normal. No respiratory distress. She has no wheezes. She has no rales. She exhibits no tenderness.  Abdominal: Soft. Bowel sounds are normal.  Vitals reviewed.         Assessment & Plan:  Pleurisy - Plan: DG Chest 2 View  Cough  Patient has developed a persistent cough and pleurisy since her acute bronchitis. I believe the pleurisy is likely an injured rib or a strain intercostal muscle. I will obtain a chest x-ray to rule out more serious underlying pulmonary pathology. If the chest x-ray is normal, I would treat her chronic cough with the addition of astelin nasal spray to reduce postnasal drip which could be  Contributing to the cough. I will also give the patient Tessalon Perles to help suppress the cough as I believe some of her symptoms are due to upper airway cough syndrome and chronic  irritation due to chronic coughing.  If the cough persisted at that point I would obtain pulmonary function test and also consider laryngo-esophageal reflux as a potential contributing factor.

## 2014-08-25 ENCOUNTER — Other Ambulatory Visit: Payer: Self-pay | Admitting: Family Medicine

## 2014-08-26 ENCOUNTER — Other Ambulatory Visit: Payer: Self-pay | Admitting: Family Medicine

## 2014-08-26 MED ORDER — AZELASTINE HCL 0.1 % NA SOLN: 2.0000 | Freq: Two times a day (BID) | NASAL | Status: DC

## 2014-08-26 MED ORDER — BENZONATATE 100 MG PO CAPS: 100.0000 mg | ORAL_CAPSULE | Freq: Three times a day (TID) | ORAL | Status: DC

## 2014-09-23 ENCOUNTER — Encounter: Payer: Self-pay | Admitting: Family Medicine

## 2014-09-23 ENCOUNTER — Other Ambulatory Visit: Payer: Self-pay | Admitting: Family Medicine

## 2014-09-23 ENCOUNTER — Ambulatory Visit (INDEPENDENT_AMBULATORY_CARE_PROVIDER_SITE_OTHER): Payer: Medicare Other | Admitting: Family Medicine

## 2014-09-23 VITALS — BP 120/80 | HR 80 | Temp 98.4°F | Resp 18 | Ht 59.5 in | Wt 151.0 lb

## 2014-09-23 DIAGNOSIS — R059 Cough, unspecified: Secondary | ICD-10-CM

## 2014-09-23 DIAGNOSIS — G629 Polyneuropathy, unspecified: Secondary | ICD-10-CM

## 2014-09-23 DIAGNOSIS — R05 Cough: Secondary | ICD-10-CM

## 2014-09-23 DIAGNOSIS — F418 Other specified anxiety disorders: Secondary | ICD-10-CM | POA: Diagnosis not present

## 2014-09-23 DIAGNOSIS — R0789 Other chest pain: Secondary | ICD-10-CM

## 2014-09-23 DIAGNOSIS — Z23 Encounter for immunization: Secondary | ICD-10-CM

## 2014-09-23 MED ORDER — DULOXETINE HCL 30 MG PO CPEP: ORAL_CAPSULE | ORAL | Status: DC

## 2014-09-23 NOTE — Progress Notes (Signed)
Subjective:    Patient ID: Brianna Jacobs, female    DOB: 20-Aug-1930, 80 y.o.   MRN: [id number]  HPI I saw the patient in November for a chronic cough and left intercostal chest pain. At that time a chest x-ray revealed no cause of her cough and intercostal chest pain. The intercostal chest pain on the left-hand side has worsened. She continues to have a chronic cough. The cough is nonproductive. There is no hemoptysis. She denies any fever or night sweats or weight loss. Of note she does have significant degenerative disc disease in the thoracic spine. Patient has 2 vertebral fractures at T10 and T12. She also has a large acoustical stimulator around the level of T8 due to her chronic low back pain. All these could potentially be irritating left-sided nerve roots creating intercostal chest pain. She also reports worsening daily anxiety. Her sister has metastatic bladder cancer. She is nearing the end of her life. This is causing near daily anxiety in this patient. She also has panic attacks. She is also complaining of worsening neuropathic pain in both of her legs. She has chronic peripheral neuropathy in her legs. She has tried Lyrica in the past but it made her feel too lethargic. She's been on chronic opiates for this pain. The patient weaned herself off the opiates. She has tried Cymbalta in the past and said it made her feel lethargic although at that time she was also taking gabapentin and narcotics. Patient has been off her Wellbutrin for several weeks. Past Medical History  Diagnosis Date  . Asthma     occassional usage of inhalers  . Seasonal allergies   . Depression   . ADD (attention deficit disorder)   . Hypertension   . Cancer     colon cancer, 16 yrs ago  . Bowel obstruction   . Neuromuscular disorder     neuropathy  . Arthritis   . Spinal stenosis   . Scoliosis   . Cataract   . GERD (gastroesophageal reflux disease)   . Osteoporosis    Current Outpatient Prescriptions on  File Prior to Visit  Medication Sig Dispense Refill  . aspirin 81 MG chewable tablet Chew 81 mg by mouth every other day.    Marland Kitchen azelastine (ASTELIN) 0.1 % nasal spray Place 2 sprays into both nostrils 2 (two) times daily. Use in each nostril as directed 30 mL 12  . benzonatate (TESSALON) 100 MG capsule Take 1 capsule (100 mg total) by mouth every 8 (eight) hours. 30 capsule 0  . buPROPion (WELLBUTRIN XL) 150 MG 24 hr tablet TAKE 1 TABLET DAILY 90 tablet 4  . Calcium Carbonate-Vitamin D (CALCIUM + D) 600-200 MG-UNIT TABS Take 1 tablet by mouth daily.    . Carboxymethylcellul-Glycerin (OPTIVE SENSITIVE) 0.5-0.9 % SOLN Apply 1 drop to eye 3 (three) times daily. 45 mL 3  . celecoxib (CELEBREX) 200 MG capsule Take 1 capsule (200 mg total) by mouth daily. 30 capsule 1  . celecoxib (CELEBREX) 200 MG capsule TAKE 1 CAPSULE DAILY 90 capsule 3  . cetirizine (ZYRTEC ALLERGY) 10 MG tablet TAKE 1 TABLET DAILY 30 tablet 1  . chlorhexidine (PERIDEX) 0.12 % solution Use as directed 15 mLs in the mouth or throat 2 (two) times daily as needed. 480 mL 3  . cyanocobalamin 2000 MCG tablet Take 2,000 mcg by mouth daily.    . fluticasone (FLONASE) 50 MCG/ACT nasal spray USE 2 SPRAYS IN EACH NOSTRIL ONCE DAILY 16 g 3  .  losartan (COZAAR) 50 MG tablet TAKE 1 TABLET DAILY 30 tablet 1  . Melatonin 10 MG TABS Take 1 tablet by mouth at bedtime.    . Multiple Vitamin (MULITIVITAMIN WITH MINERALS) TABS Take 1 tablet by mouth daily.    . Omega-3 Fatty Acids (FISH OIL) 600 MG CAPS Take 1,200 capsules by mouth daily.     Marland Kitchen oxyCODONE-acetaminophen (PERCOCET) 10-325 MG per tablet Take 1 tablet by mouth every 6 (six) hours as needed. For pain    . pantoprazole (PROTONIX) 40 MG tablet TAKE 1 TABLET DAILY 90 tablet 3  . PATADAY 0.2 % SOLN INSTILL 1 DROP IN BOTH EYES DAILY 3 Bottle 2  . PROAIR HFA 108 (90 BASE) MCG/ACT inhaler INHALE 2 PUFFS INTO THE LUNGS EVERY 6 HOURS AS NEEDED FOR SHORTNESS OF BREATH 3 each 3  . Propylene Glycol  (SYSTANE BALANCE OP) Apply 1 drop to eye as needed. For dry eyes    . ranitidine (ZANTAC) 150 MG tablet Take 1 tablet (150 mg total) by mouth 2 (two) times daily. 180 tablet 3  . ZYRTEC ALLERGY 10 MG tablet TAKE 1 TABLET DAILY 90 tablet 3   No current facility-administered medications on file prior to visit.   Allergies  Allergen Reactions  . Cymbalta [Duloxetine Hcl] Other (See Comments)    Makes me lethargic  . Lyrica [Pregabalin] Other (See Comments)    Makes me lethargic  . Ranitidine Diarrhea  . Celebrex [Celecoxib] Rash  . Naprosyn [Naproxen] Rash   History   Social History  . Marital Status: Married    Spouse Name: N/A    Number of Children: N/A  . Years of Education: N/A   Occupational History  . Not on file.   Social History Main Topics  . Smoking status: Former Smoker    Quit date: 10/11/1977  . Smokeless tobacco: Never Used  . Alcohol Use: No  . Drug Use: Yes    Special: Oxycodone  . Sexual Activity: Not on file   Other Topics Concern  . Not on file   Social History Narrative      Review of Systems  All other systems reviewed and are negative.      Objective:   Physical Exam  Cardiovascular: Normal rate, regular rhythm and normal heart sounds.   Pulmonary/Chest: Effort normal and breath sounds normal. No respiratory distress. She has no wheezes. She has no rales. She exhibits no tenderness.  Abdominal: Bowel sounds are normal. She exhibits no distension. There is no tenderness. There is no rebound.  Neurological: She displays normal reflexes. She exhibits normal muscle tone.  Vitals reviewed.         Assessment & Plan:  Situational anxiety - Plan: DULoxetine (CYMBALTA) 30 MG capsule  Neuropathy  Other chest pain - Plan: CT Chest W Contrast  Cough - Plan: CT Chest W Contrast  Patient is dealing with situational anxiety, depression, peripheral neuropathy. She was to avoid addictive medications. At the present time Cymbalta seems like a  reasonable option to help manage her anxiety and also potentially help with neuropathy given his indication for neuropathy in diabetics. He can also help with chronic low back pain as well as arthritic pain. Therefore I will start the patient on 30 mg a day of Cymbalta. In one week if she is having no adverse effects I would like her to increase Cymbalta to 60 mg a day. I will recheck the patient back in 3-4 weeks to assess for effectiveness. Given the chronic  left-sided chest pain and a chronic cough I will proceed with a CT scan of the chest. I believe the most likely source of the patient's chest pain is thoracic radiculopathy due to her vertebral fractures. I'm unable to obtain an MRI of the thoracic spine due to the nerve stimulator in the thoracic spine.

## 2014-09-24 ENCOUNTER — Ambulatory Visit: Payer: Medicare Other | Admitting: Family Medicine

## 2014-09-27 ENCOUNTER — Ambulatory Visit
Admission: RE | Admit: 2014-09-27 | Discharge: 2014-09-27 | Disposition: A | Discharge status: Home / Self Care | Payer: Medicare Other | Source: Ambulatory Visit | Attending: Family Medicine | Admitting: Family Medicine

## 2014-09-27 DIAGNOSIS — R059 Cough, unspecified: Secondary | ICD-10-CM

## 2014-09-27 DIAGNOSIS — R918 Other nonspecific abnormal finding of lung field: Secondary | ICD-10-CM | POA: Diagnosis not present

## 2014-09-27 DIAGNOSIS — R05 Cough: Secondary | ICD-10-CM

## 2014-09-27 DIAGNOSIS — R0789 Other chest pain: Secondary | ICD-10-CM

## 2014-09-27 DIAGNOSIS — I712 Thoracic aortic aneurysm, without rupture: Secondary | ICD-10-CM | POA: Diagnosis not present

## 2014-09-27 MED ORDER — IOHEXOL 300 MG/ML  SOLN
75.0000 mL | Freq: Once | INTRAMUSCULAR | Status: AC | PRN
  Administered 2014-09-27: 75 mL via INTRAVENOUS

## 2014-10-14 ENCOUNTER — Encounter: Payer: Self-pay | Admitting: Family Medicine

## 2014-10-14 ENCOUNTER — Ambulatory Visit (INDEPENDENT_AMBULATORY_CARE_PROVIDER_SITE_OTHER): Payer: Medicare Other | Admitting: Family Medicine

## 2014-10-14 VITALS — BP 140/60 | HR 68 | Temp 98.2°F | Resp 18 | Ht 59.5 in | Wt 152.0 lb

## 2014-10-14 DIAGNOSIS — F418 Other specified anxiety disorders: Secondary | ICD-10-CM

## 2014-10-14 DIAGNOSIS — G629 Polyneuropathy, unspecified: Secondary | ICD-10-CM | POA: Diagnosis not present

## 2014-10-14 DIAGNOSIS — R197 Diarrhea, unspecified: Secondary | ICD-10-CM | POA: Diagnosis not present

## 2014-10-14 DIAGNOSIS — I719 Aortic aneurysm of unspecified site, without rupture: Secondary | ICD-10-CM | POA: Insufficient documentation

## 2014-10-14 MED ORDER — DIPHENOXYLATE-ATROPINE 2.5-0.025 MG PO TABS: 2.0000 | ORAL_TABLET | Freq: Four times a day (QID) | ORAL | Status: DC | PRN

## 2014-10-14 NOTE — Progress Notes (Signed)
Subjective:    Patient ID: Brianna Jacobs, female    DOB: 07/24/1930, 81 y.o.   MRN: [id number]  HPI 09/23/14 I saw the patient in November for a chronic cough and left intercostal chest pain. At that time a chest x-ray revealed no cause of her cough and intercostal chest pain. The intercostal chest pain on the left-hand side has worsened. She continues to have a chronic cough. The cough is nonproductive. There is no hemoptysis. She denies any fever or night sweats or weight loss. Of note she does have significant degenerative disc disease in the thoracic spine. Patient has 2 vertebral fractures at T10 and T12. She also has a large acoustical stimulator around the level of T8 due to her chronic low back pain. All these could potentially be irritating left-sided nerve roots creating intercostal chest pain. She also reports worsening daily anxiety. Her sister has metastatic bladder cancer. She is nearing the end of her life. This is causing near daily anxiety in this patient. She also has panic attacks. She is also complaining of worsening neuropathic pain in both of her legs. She has chronic peripheral neuropathy in her legs. She has tried Lyrica in the past but it made her feel too lethargic. She's been on chronic opiates for this pain. The patient weaned herself off the opiates. She has tried Cymbalta in the past and said it made her feel lethargic although at that time she was also taking gabapentin and narcotics. Patient has been off her Wellbutrin for several weeks.  At that time, my plan was:  Patient is dealing with situational anxiety, depression, peripheral neuropathy. She was to avoid addictive medications. At the present time Cymbalta seems like a reasonable option to help manage her anxiety and also potentially help with neuropathy given his indication for neuropathy in diabetics. He can also help with chronic low back pain as well as arthritic pain. Therefore I will start the patient on 30 mg a  day of Cymbalta. In one week if she is having no adverse effects I would like her to increase Cymbalta to 60 mg a day. I will recheck the patient back in 3-4 weeks to assess for effectiveness. Given the chronic left-sided chest pain and a chronic cough I will proceed with a CT scan of the chest. I believe the most likely source of the patient's chest pain is thoracic radiculopathy due to her vertebral fractures. I'm unable to obtain an MRI of the thoracic spine due to the nerve stimulator in the thoracic spine.  10/14/13 Patient has seen no improvement of the anxiety on Cymbalta. Furthermore she sees no improvement of the neuropathy in her feet and pain in her lower back on the medication. She has tried and failed gabapentin in the past as well as Lyrica. She is not interested in narcotic pain medication. She is also tried amitriptyline in the past with little benefit. Of note on the CT scan of her chest she was found to have a mild borderline ascending aortic aneurysm as well as a small pulmonary nodule. Repeat CTA of the chest is recommended in one year. Past Medical History  Diagnosis Date  . Asthma     occassional usage of inhalers  . Seasonal allergies   . Depression   . ADD (attention deficit disorder)   . Hypertension   . Cancer     colon cancer, 16 yrs ago  . Bowel obstruction   . Neuromuscular disorder     neuropathy  .  Arthritis   . Spinal stenosis   . Scoliosis   . Cataract   . GERD (gastroesophageal reflux disease)   . Osteoporosis    Current Outpatient Prescriptions on File Prior to Visit  Medication Sig Dispense Refill  . aspirin 81 MG chewable tablet Chew 81 mg by mouth every other day.    Marland Kitchen azelastine (ASTELIN) 0.1 % nasal spray Place 2 sprays into both nostrils 2 (two) times daily. Use in each nostril as directed 30 mL 12  . benzonatate (TESSALON) 100 MG capsule Take 1 capsule (100 mg total) by mouth every 8 (eight) hours. 30 capsule 0  . buPROPion (WELLBUTRIN XL) 150 MG  24 hr tablet TAKE 1 TABLET DAILY 90 tablet 4  . Calcium Carbonate-Vitamin D (CALCIUM + D) 600-200 MG-UNIT TABS Take 1 tablet by mouth daily.    . Carboxymethylcellul-Glycerin (OPTIVE SENSITIVE) 0.5-0.9 % SOLN Apply 1 drop to eye 3 (three) times daily. 45 mL 3  . celecoxib (CELEBREX) 200 MG capsule Take 1 capsule (200 mg total) by mouth daily. 30 capsule 1  . celecoxib (CELEBREX) 200 MG capsule TAKE 1 CAPSULE DAILY 90 capsule 3  . cetirizine (ZYRTEC ALLERGY) 10 MG tablet TAKE 1 TABLET DAILY 30 tablet 1  . chlorhexidine (PERIDEX) 0.12 % solution Use as directed 15 mLs in the mouth or throat 2 (two) times daily as needed. 480 mL 3  . cyanocobalamin 2000 MCG tablet Take 2,000 mcg by mouth daily.    . DULoxetine (CYMBALTA) 30 MG capsule Take 1 pill a day for 1 week then increase to 2 pills a day thereafter. 60 capsule 3  . fluticasone (FLONASE) 50 MCG/ACT nasal spray USE 2 SPRAYS IN EACH NOSTRIL ONCE DAILY 16 g 3  . losartan (COZAAR) 50 MG tablet TAKE 1 TABLET DAILY 30 tablet 1  . Melatonin 10 MG TABS Take 1 tablet by mouth at bedtime.    . Multiple Vitamin (MULITIVITAMIN WITH MINERALS) TABS Take 1 tablet by mouth daily.    . Omega-3 Fatty Acids (FISH OIL) 600 MG CAPS Take 1,200 capsules by mouth daily.     Marland Kitchen oxyCODONE-acetaminophen (PERCOCET) 10-325 MG per tablet Take 1 tablet by mouth every 6 (six) hours as needed. For pain    . pantoprazole (PROTONIX) 40 MG tablet TAKE 1 TABLET DAILY 90 tablet 3  . PATADAY 0.2 % SOLN INSTILL 1 DROP IN BOTH EYES DAILY 3 Bottle 2  . PROAIR HFA 108 (90 BASE) MCG/ACT inhaler INHALE 2 PUFFS INTO THE LUNGS EVERY 6 HOURS AS NEEDED FOR SHORTNESS OF BREATH 3 each 3  . Propylene Glycol (SYSTANE BALANCE OP) Apply 1 drop to eye as needed. For dry eyes    . ranitidine (ZANTAC) 150 MG tablet Take 1 tablet (150 mg total) by mouth 2 (two) times daily. 180 tablet 3  . ZYRTEC ALLERGY 10 MG tablet TAKE 1 TABLET DAILY 90 tablet 3   No current facility-administered medications on  file prior to visit.   Allergies  Allergen Reactions  . Cymbalta [Duloxetine Hcl] Other (See Comments)    Makes me lethargic  . Lyrica [Pregabalin] Other (See Comments)    Makes me lethargic  . Ranitidine Diarrhea  . Celebrex [Celecoxib] Rash  . Naprosyn [Naproxen] Rash   History   Social History  . Marital Status: Married    Spouse Name: N/A    Number of Children: N/A  . Years of Education: N/A   Occupational History  . Not on file.   Social History Main  Topics  . Smoking status: Former Smoker    Quit date: 10/11/1977  . Smokeless tobacco: Never Used  . Alcohol Use: No  . Drug Use: Yes    Special: Oxycodone  . Sexual Activity: Not on file   Other Topics Concern  . Not on file   Social History Narrative      Review of Systems  All other systems reviewed and are negative.      Objective:   Physical Exam  Cardiovascular: Normal rate, regular rhythm and normal heart sounds.   Pulmonary/Chest: Effort normal and breath sounds normal. No respiratory distress. She has no wheezes. She has no rales. She exhibits no tenderness.  Abdominal: Bowel sounds are normal. She exhibits no distension. There is no tenderness. There is no rebound.  Neurological: She displays normal reflexes. She exhibits normal muscle tone.  Vitals reviewed.         Assessment & Plan:  Diarrhea - Plan: diphenoxylate-atropine (LOMOTIL) 2.5-0.025 MG per tablet  Situational anxiety  Neuropathy  Patient is having some mild diarrhea related to her lymphocytic colitis. I recommended she use Lomotil 2 tablets 4 times a day when necessary diarrhea. I recommended she discontinue Cymbalta as it is not helping with her anxiety nor her neuropathic pain. To treat her pain I recommended Tylenol 500 mg by mouth twice a day with Advil to be used on an as needed basis. Patient is comfortable with this plan. I recommended a repeat CTA of the chest in one year.

## 2014-10-21 ENCOUNTER — Telehealth: Payer: Self-pay | Admitting: Family Medicine

## 2014-10-21 ENCOUNTER — Encounter: Payer: Self-pay | Admitting: Family Medicine

## 2014-10-21 DIAGNOSIS — R197 Diarrhea, unspecified: Secondary | ICD-10-CM

## 2014-10-21 NOTE — Telephone Encounter (Signed)
[phone] PT was put on a medication and it is making her have a BM a whole lot she said.  she has gone 37 times since last Wednesday and 3 times this morning

## 2014-10-22 ENCOUNTER — Other Ambulatory Visit: Payer: Self-pay | Admitting: Family Medicine

## 2014-10-22 DIAGNOSIS — K52832 Lymphocytic colitis: Secondary | ICD-10-CM

## 2014-10-22 NOTE — Telephone Encounter (Signed)
Pt aware and referral placed and stool tests ordered

## 2014-10-22 NOTE — Telephone Encounter (Signed)
Rec GI referral asap.  Also rec stool cx, stool o+P, stool cdiff to rule out infection but I believe it is lymphocytic colitis.

## 2014-10-23 ENCOUNTER — Other Ambulatory Visit: Payer: Self-pay | Admitting: Family Medicine

## 2014-10-23 ENCOUNTER — Other Ambulatory Visit: Payer: Medicare Other

## 2014-10-23 DIAGNOSIS — K5289 Other specified noninfective gastroenteritis and colitis: Secondary | ICD-10-CM | POA: Diagnosis not present

## 2014-10-23 DIAGNOSIS — K52832 Lymphocytic colitis: Secondary | ICD-10-CM

## 2014-10-24 LAB — OVA AND PARASITE EXAMINATION: OP: NONE SEEN

## 2014-10-24 LAB — CLOSTRIDIUM DIFFICILE BY PCR: Toxigenic C. Difficile by PCR: NOT DETECTED

## 2014-10-25 ENCOUNTER — Telehealth: Payer: Self-pay | Admitting: Family Medicine

## 2014-10-25 NOTE — Telephone Encounter (Signed)
Pt made aware of lab results and results sent to Dr Collene Mares

## 2014-10-25 NOTE — Telephone Encounter (Signed)
-----   Message from [name], MD sent at 10/24/2014  3:37 PM EST ----- c diff negative.

## 2014-10-25 NOTE — Telephone Encounter (Signed)
-----   Message from [name], MD sent at 10/24/2014  4:35 PM EST ----- So far stool tests are negative.

## 2014-10-25 NOTE — Telephone Encounter (Signed)
-----   Message from [name], MD sent at 10/25/2014 10:30 AM EST ----- All stool tests are negative for infection.   Proceed with GI consult.

## 2014-10-27 LAB — STOOL CULTURE

## 2014-11-12 DIAGNOSIS — Z800 Family history of malignant neoplasm of digestive organs: Secondary | ICD-10-CM | POA: Diagnosis not present

## 2014-11-12 DIAGNOSIS — K219 Gastro-esophageal reflux disease without esophagitis: Secondary | ICD-10-CM | POA: Diagnosis not present

## 2014-11-12 DIAGNOSIS — K5730 Diverticulosis of large intestine without perforation or abscess without bleeding: Secondary | ICD-10-CM | POA: Diagnosis not present

## 2014-11-12 DIAGNOSIS — R194 Change in bowel habit: Secondary | ICD-10-CM | POA: Diagnosis not present

## 2014-11-12 DIAGNOSIS — Z85038 Personal history of other malignant neoplasm of large intestine: Secondary | ICD-10-CM | POA: Diagnosis not present

## 2014-11-14 ENCOUNTER — Encounter: Payer: Self-pay | Admitting: Family Medicine

## 2014-11-14 ENCOUNTER — Ambulatory Visit (INDEPENDENT_AMBULATORY_CARE_PROVIDER_SITE_OTHER): Payer: Medicare Other | Admitting: Family Medicine

## 2014-11-14 VITALS — BP 142/70 | HR 100 | Temp 98.2°F | Resp 18 | Ht 59.5 in | Wt 149.0 lb

## 2014-11-14 DIAGNOSIS — R040 Epistaxis: Secondary | ICD-10-CM | POA: Diagnosis not present

## 2014-11-14 NOTE — Progress Notes (Signed)
Subjective:    Patient ID: Brianna Jacobs, female    DOB: 1930/05/19, 81 y.o.   MRN: [id number]  HPI  patient is concerned about epistaxis. She is currently on Flonase everyday for rhinitis. Also gave her Astelin to augment the Flonase when she had persistent rhinitis. Since using these medications together she has developed occasional nosebleeds. She denies any headache or blurred vision. She denies any sinus pain. Otherwise she is doing well without complications. She has recently seen the stomach doctor regarding her chronic diarrhea and has been started on a probiotic as well as Xifaxan.   However it is too early to tell if this is helping. Past Medical History  Diagnosis Date  . Asthma     occassional usage of inhalers  . Seasonal allergies   . Depression   . ADD (attention deficit disorder)   . Hypertension   . Cancer     colon cancer, 16 yrs ago  . Bowel obstruction   . Neuromuscular disorder     neuropathy  . Arthritis   . Spinal stenosis   . Scoliosis   . Cataract   . GERD (gastroesophageal reflux disease)   . Osteoporosis   . Aortic aneurysm    Past Surgical History  Procedure Laterality Date  . Sigmoid resection / rectopexy    . Colonoscopy      2 yrs ago  . Eye surgery      cataracts bilat  . Joint replacement      bilat shoulders  . Fracture surgery      thoracic spine  . Abdominal hysterectomy  1972    partial  . Spinal cord stimulator insertion  11/18/2011    Procedure: LUMBAR SPINAL CORD STIMULATOR INSERTION;  Surgeon: Dahlia Bailiff, MD;  Location: [place];  Service: Orthopedics;  Laterality: N/A;  Thoracic 9 laminiotomy, SPINAL CORD STIMULATOR PLACEMENT thoracic 9- Thoracic 7   Current Outpatient Prescriptions on File Prior to Visit  Medication Sig Dispense Refill  . aspirin 81 MG chewable tablet Chew 81 mg by mouth every other day.    Marland Kitchen azelastine (ASTELIN) 0.1 % nasal spray Place 2 sprays into both nostrils 2 (two) times daily. Use in each nostril as  directed 30 mL 12  . benzonatate (TESSALON) 100 MG capsule Take 1 capsule (100 mg total) by mouth every 8 (eight) hours. 30 capsule 0  . Carboxymethylcellul-Glycerin (OPTIVE SENSITIVE) 0.5-0.9 % SOLN Apply 1 drop to eye 3 (three) times daily. 45 mL 3  . celecoxib (CELEBREX) 200 MG capsule Take 1 capsule (200 mg total) by mouth daily. 30 capsule 1  . celecoxib (CELEBREX) 200 MG capsule TAKE 1 CAPSULE DAILY 90 capsule 3  . cetirizine (ZYRTEC ALLERGY) 10 MG tablet TAKE 1 TABLET DAILY 30 tablet 1  . chlorhexidine (PERIDEX) 0.12 % solution Use as directed 15 mLs in the mouth or throat 2 (two) times daily as needed. 480 mL 3  . cyanocobalamin 2000 MCG tablet Take 2,000 mcg by mouth daily.    . DULoxetine (CYMBALTA) 30 MG capsule Take 1 pill a day for 1 week then increase to 2 pills a day thereafter. 60 capsule 3  . fluticasone (FLONASE) 50 MCG/ACT nasal spray USE 2 SPRAYS IN EACH NOSTRIL ONCE DAILY 16 g 3  . losartan (COZAAR) 50 MG tablet TAKE 1 TABLET DAILY 30 tablet 1  . Melatonin 10 MG TABS Take 1 tablet by mouth at bedtime.    . Multiple Vitamin (MULITIVITAMIN WITH MINERALS) TABS Take  1 tablet by mouth daily.    . pantoprazole (PROTONIX) 40 MG tablet TAKE 1 TABLET DAILY 90 tablet 3  . PATADAY 0.2 % SOLN INSTILL 1 DROP IN BOTH EYES DAILY 3 Bottle 2  . PROAIR HFA 108 (90 BASE) MCG/ACT inhaler INHALE 2 PUFFS INTO THE LUNGS EVERY 6 HOURS AS NEEDED FOR SHORTNESS OF BREATH 3 each 3  . Propylene Glycol (SYSTANE BALANCE OP) Apply 1 drop to eye as needed. For dry eyes    . ranitidine (ZANTAC) 150 MG tablet Take 1 tablet (150 mg total) by mouth 2 (two) times daily. 180 tablet 3  . Calcium Carbonate-Vitamin D (CALCIUM + D) 600-200 MG-UNIT TABS Take 1 tablet by mouth daily.    . diphenoxylate-atropine (LOMOTIL) 2.5-0.025 MG per tablet Take 2 tablets by mouth 4 (four) times daily as needed for diarrhea or loose stools. (Patient not taking: Reported on 11/14/2014) 30 tablet 0   No current facility-administered  medications on file prior to visit.   Allergies  Allergen Reactions  . Cymbalta [Duloxetine Hcl] Other (See Comments)    Makes me lethargic  . Lyrica [Pregabalin] Other (See Comments)    Makes me lethargic  . Ranitidine Diarrhea  . Celebrex [Celecoxib] Rash  . Naprosyn [Naproxen] Rash   History   Social History  . Marital Status: Married    Spouse Name: N/A    Number of Children: N/A  . Years of Education: N/A   Occupational History  . Not on file.   Social History Main Topics  . Smoking status: Former Smoker    Quit date: 10/11/1977  . Smokeless tobacco: Never Used  . Alcohol Use: No  . Drug Use: Yes    Special: Oxycodone  . Sexual Activity: Not on file   Other Topics Concern  . Not on file   Social History Narrative      Review of Systems  All other systems reviewed and are negative.      Objective:   Physical Exam  Constitutional: She appears well-developed and well-nourished.  HENT:  Right Ear: External ear normal.  Left Ear: External ear normal.  Nose: Nose normal.  Mouth/Throat: Oropharynx is clear and moist. No oropharyngeal exudate.  Eyes: Conjunctivae are normal.  Neck: Neck supple.  Cardiovascular: Normal rate, regular rhythm and normal heart sounds.   Pulmonary/Chest: Effort normal and breath sounds normal.  Lymphadenopathy:    She has no cervical adenopathy.          Assessment & Plan:  Epistaxis   I have asked the patient to discontinue Flonase and Astelin. I've also recommended she put Vaseline in her nose every night to help moisturize the skin and sleep next to a humidifier. This should stop the nosebleeds.

## 2014-11-21 ENCOUNTER — Other Ambulatory Visit: Payer: Self-pay | Admitting: Family Medicine

## 2014-12-10 DIAGNOSIS — R140 Abdominal distension (gaseous): Secondary | ICD-10-CM | POA: Diagnosis not present

## 2014-12-10 DIAGNOSIS — E669 Obesity, unspecified: Secondary | ICD-10-CM | POA: Diagnosis not present

## 2014-12-10 DIAGNOSIS — K219 Gastro-esophageal reflux disease without esophagitis: Secondary | ICD-10-CM | POA: Diagnosis not present

## 2015-01-16 DIAGNOSIS — R159 Full incontinence of feces: Secondary | ICD-10-CM | POA: Diagnosis not present

## 2015-01-16 DIAGNOSIS — Z800 Family history of malignant neoplasm of digestive organs: Secondary | ICD-10-CM | POA: Diagnosis not present

## 2015-01-16 DIAGNOSIS — Z85030 Personal history of malignant carcinoid tumor of large intestine: Secondary | ICD-10-CM | POA: Diagnosis not present

## 2015-01-16 DIAGNOSIS — K580 Irritable bowel syndrome with diarrhea: Secondary | ICD-10-CM | POA: Diagnosis not present

## 2015-01-27 ENCOUNTER — Encounter: Payer: Self-pay | Admitting: Family Medicine

## 2015-01-27 ENCOUNTER — Ambulatory Visit (INDEPENDENT_AMBULATORY_CARE_PROVIDER_SITE_OTHER): Payer: Medicare Other | Admitting: Family Medicine

## 2015-01-27 VITALS — BP 120/68 | HR 74 | Temp 98.8°F | Resp 18 | Ht 59.5 in | Wt 151.0 lb

## 2015-01-27 DIAGNOSIS — R519 Headache, unspecified: Secondary | ICD-10-CM

## 2015-01-27 DIAGNOSIS — R51 Headache: Secondary | ICD-10-CM

## 2015-01-27 DIAGNOSIS — G5790 Unspecified mononeuropathy of unspecified lower limb: Secondary | ICD-10-CM

## 2015-01-27 DIAGNOSIS — M542 Cervicalgia: Secondary | ICD-10-CM

## 2015-01-27 MED ORDER — DICLOFENAC SODIUM 75 MG PO TBEC: 75.0000 mg | DELAYED_RELEASE_TABLET | Freq: Two times a day (BID) | ORAL | Status: DC

## 2015-01-27 MED ORDER — AMITRIPTYLINE HCL 50 MG PO TABS: 50.0000 mg | ORAL_TABLET | Freq: Every day | ORAL | Status: DC

## 2015-01-28 ENCOUNTER — Encounter: Payer: Self-pay | Admitting: Family Medicine

## 2015-01-28 NOTE — Progress Notes (Signed)
Subjective:    Patient ID: Brianna Jacobs, female    DOB: [birth year], 81 y.o.   MRN: [id number]  HPI Patient has a long-standing history of spinal stenosis, low back pain, and peripheral neuropathy in her legs. He has tried and failed Cymbalta, Lyrica, gabapentin, and oxycodone. She continues to have sharp shooting lancing pains that radiate down her legs into her feet. Celebrex has been little benefit. He also reports crepitus and pain in her neck. She denies any cervical radiculopathy in the arms. She denies any arm weakness. However she does report shooting pains that radiate from her neck into her occiput and around her temples. These headaches occur once or twice a day and lasts seconds to minutes. They seem to be triggered by movement of the head and the neck. She also reports dull pain in the neck constantly. She is scheduled to have a colonoscopy to evaluate the cause of her chronic diarrhea.  Past Medical History  Diagnosis Date  . Asthma     occassional usage of inhalers  . Seasonal allergies   . Depression   . ADD (attention deficit disorder)   . Hypertension   . Cancer     colon cancer, 16 yrs ago  . Bowel obstruction   . Neuromuscular disorder     neuropathy  . Arthritis   . Spinal stenosis   . Scoliosis   . Cataract   . GERD (gastroesophageal reflux disease)   . Osteoporosis   . Aortic aneurysm    Past Surgical History  Procedure Laterality Date  . Sigmoid resection / rectopexy    . Colonoscopy      2 yrs ago  . Eye surgery      cataracts bilat  . Joint replacement      bilat shoulders  . Fracture surgery      thoracic spine  . Abdominal hysterectomy  1972    partial  . Spinal cord stimulator insertion  11/18/2011    Procedure: LUMBAR SPINAL CORD STIMULATOR INSERTION;  Surgeon: Dahlia Bailiff, MD;  Location: [facility];  Service: Orthopedics;  Laterality: N/A;  Thoracic 9 laminiotomy, SPINAL CORD STIMULATOR PLACEMENT thoracic 9- Thoracic 7   Current Outpatient  Prescriptions on File Prior to Visit  Medication Sig Dispense Refill  . aspirin 81 MG chewable tablet Chew 81 mg by mouth every other day.    Marland Kitchen azelastine (ASTELIN) 0.1 % nasal spray Place 2 sprays into both nostrils 2 (two) times daily. Use in each nostril as directed 30 mL 12  . benzonatate (TESSALON) 100 MG capsule Take 1 capsule (100 mg total) by mouth every 8 (eight) hours. 30 capsule 0  . Calcium Carbonate-Vitamin D (CALCIUM + D) 600-200 MG-UNIT TABS Take 1 tablet by mouth daily.    . Carboxymethylcellul-Glycerin (OPTIVE SENSITIVE) 0.5-0.9 % SOLN Apply 1 drop to eye 3 (three) times daily. 45 mL 3  . celecoxib (CELEBREX) 200 MG capsule Take 1 capsule (200 mg total) by mouth daily. 30 capsule 1  . cetirizine (ZYRTEC ALLERGY) 10 MG tablet TAKE 1 TABLET DAILY 30 tablet 1  . chlorhexidine (PERIDEX) 0.12 % solution Use as directed 15 mLs in the mouth or throat 2 (two) times daily as needed. 480 mL 3  . cyanocobalamin 2000 MCG tablet Take 2,000 mcg by mouth daily.    . diphenoxylate-atropine (LOMOTIL) 2.5-0.025 MG per tablet Take 2 tablets by mouth 4 (four) times daily as needed for diarrhea or loose stools. 30 tablet 0  . DULoxetine (  CYMBALTA) 30 MG capsule Take 1 pill a day for 1 week then increase to 2 pills a day thereafter. 60 capsule 3  . fluticasone (FLONASE) 50 MCG/ACT nasal spray USE 2 SPRAYS IN EACH NOSTRIL ONCE DAILY 16 g 3  . losartan (COZAAR) 50 MG tablet TAKE 1 TABLET DAILY 30 tablet 1  . losartan (COZAAR) 50 MG tablet TAKE 1 TABLET DAILY 90 tablet 3  . Melatonin 10 MG TABS Take 1 tablet by mouth at bedtime.    . Multiple Vitamin (MULITIVITAMIN WITH MINERALS) TABS Take 1 tablet by mouth daily.    . Omega-3 Fatty Acids (FISH OIL) 1200 MG CPDR Take by mouth.    . pantoprazole (PROTONIX) 40 MG tablet TAKE 1 TABLET DAILY 90 tablet 3  . PATADAY 0.2 % SOLN INSTILL 1 DROP IN BOTH EYES DAILY 3 Bottle 2  . PROAIR HFA 108 (90 BASE) MCG/ACT inhaler INHALE 2 PUFFS INTO THE LUNGS EVERY 6 HOURS  AS NEEDED FOR SHORTNESS OF BREATH 3 each 3  . Propylene Glycol (SYSTANE BALANCE OP) Apply 1 drop to eye as needed. For dry eyes    . ranitidine (ZANTAC) 150 MG tablet Take 1 tablet (150 mg total) by mouth 2 (two) times daily. 180 tablet 3  . rifaximin (XIFAXAN) 550 MG TABS tablet Take 550 mg by mouth 2 (two) times daily.     No current facility-administered medications on file prior to visit.   Allergies  Allergen Reactions  . Cymbalta [Duloxetine Hcl] Other (See Comments)    Makes me lethargic  . Lyrica [Pregabalin] Other (See Comments)    Makes me lethargic  . Ranitidine Diarrhea  . Celebrex [Celecoxib] Rash  . Naprosyn [Naproxen] Rash   History   Social History  . Marital Status: Married    Spouse Name: N/A  . Number of Children: N/A  . Years of Education: N/A   Occupational History  . Not on file.   Social History Main Topics  . Smoking status: Former Smoker    Quit date: 10/11/1977  . Smokeless tobacco: Never Used  . Alcohol Use: No  . Drug Use: Yes    Special: Oxycodone  . Sexual Activity: Not on file   Other Topics Concern  . Not on file   Social History Narrative    Review of Systems  All other systems reviewed and are negative.      Objective:   Physical Exam  Constitutional: She is oriented to person, place, and time. She appears well-developed and well-nourished. No distress.  HENT:  Head: Normocephalic and atraumatic.  Right Ear: External ear normal.  Left Ear: External ear normal.  Nose: Nose normal.  Mouth/Throat: Oropharynx is clear and moist. No oropharyngeal exudate.  Eyes: Conjunctivae and EOM are normal. Pupils are equal, round, and reactive to light. Right eye exhibits no discharge. Left eye exhibits no discharge.  Neck: Neck supple.  Cardiovascular: Normal rate, regular rhythm and normal heart sounds.   No murmur heard. Pulmonary/Chest: Effort normal and breath sounds normal. No respiratory distress. She has no wheezes. She has no  rales.  Abdominal: Soft. Bowel sounds are normal.  Lymphadenopathy:    She has no cervical adenopathy.  Neurological: She is alert and oriented to person, place, and time. She has normal reflexes. No cranial nerve deficit. She exhibits normal muscle tone. Coordination normal.  Skin: She is not diaphoretic.  Vitals reviewed.         Assessment & Plan:  Headache disorder - Plan: amitriptyline (ELAVIL)  50 MG tablet, diclofenac (VOLTAREN) 75 MG EC tablet  Neck pain  Neuropathy of lower extremity, unspecified laterality  patient certainly has peripheral neuropathy in her legs likely due to lumbar spinal stenosis and lumbar radiculopathy. She also has neck pain I suspect from spondylosis in the neck and most likely degenerative disc disease in the neck. I suspect cervicogenic headaches. Therefore I will switch the patient from Celebrex to diclofenac 75 mg by mouth twice a day as an anti-inflammatory to treat the root cause of her neck pain and most likely the etiology of her headaches. I will also start the patient on amitriptyline 50 mg by mouth daily to see if I can help alleviate some of the neuropathic pain and also hopefully help prevent some of the headaches. Recheck in 2 weeks or sooner if worse.

## 2015-01-29 DIAGNOSIS — K5289 Other specified noninfective gastroenteritis and colitis: Secondary | ICD-10-CM | POA: Diagnosis not present

## 2015-01-29 DIAGNOSIS — Z85038 Personal history of other malignant neoplasm of large intestine: Secondary | ICD-10-CM | POA: Diagnosis not present

## 2015-01-29 DIAGNOSIS — Z1211 Encounter for screening for malignant neoplasm of colon: Secondary | ICD-10-CM | POA: Diagnosis not present

## 2015-01-29 DIAGNOSIS — Z800 Family history of malignant neoplasm of digestive organs: Secondary | ICD-10-CM | POA: Diagnosis not present

## 2015-01-29 DIAGNOSIS — Z86010 Personal history of colonic polyps: Secondary | ICD-10-CM | POA: Diagnosis not present

## 2015-01-29 DIAGNOSIS — R194 Change in bowel habit: Secondary | ICD-10-CM | POA: Diagnosis not present

## 2015-01-31 ENCOUNTER — Telehealth: Payer: Self-pay | Admitting: Family Medicine

## 2015-01-31 DIAGNOSIS — I498 Other specified cardiac arrhythmias: Secondary | ICD-10-CM

## 2015-01-31 NOTE — Telephone Encounter (Signed)
Tried to call no answer and no vm - we do not have report yet but Dr. Collene Mares will call her with the report before we receive it.

## 2015-01-31 NOTE — Telephone Encounter (Signed)
Patient calling to see if results of colonscopy were in [phone]

## 2015-02-04 ENCOUNTER — Telehealth: Payer: Self-pay | Admitting: *Deleted

## 2015-02-04 DIAGNOSIS — I498 Other specified cardiac arrhythmias: Secondary | ICD-10-CM

## 2015-02-04 NOTE — Telephone Encounter (Signed)
Spoke to pt and she did f/u with GI however GI MD recommended she see a cardiologist as her heart was fluttering prior to her procedure. Per [facility] ok to do referral and referral placed and pt aware.

## 2015-02-04 NOTE — Telephone Encounter (Signed)
-----   Message from [name], MD sent at 02/04/2015  7:33 AM EDT ----- Her GI recommended she see cardiology.  I am not sure why but her husband would like her to see his cardiologist Peter Martinique.  I am fine with that referral.

## 2015-02-04 NOTE — Telephone Encounter (Signed)
Referral placed.

## 2015-02-07 ENCOUNTER — Other Ambulatory Visit: Payer: Self-pay | Admitting: Family Medicine

## 2015-03-04 ENCOUNTER — Ambulatory Visit (INDEPENDENT_AMBULATORY_CARE_PROVIDER_SITE_OTHER): Payer: Medicare Other | Admitting: Family Medicine

## 2015-03-04 ENCOUNTER — Encounter: Payer: Self-pay | Admitting: Family Medicine

## 2015-03-04 VITALS — BP 130/76 | HR 78 | Temp 98.6°F | Resp 16 | Ht 59.5 in | Wt 154.0 lb

## 2015-03-04 DIAGNOSIS — G5790 Unspecified mononeuropathy of unspecified lower limb: Secondary | ICD-10-CM | POA: Diagnosis not present

## 2015-03-04 NOTE — Progress Notes (Signed)
Subjective:    Patient ID: Brianna Jacobs, female    DOB: [birth year], 81 y.o.   MRN: [id number]  HPI 01/27/15 Patient has a long-standing history of spinal stenosis, low back pain, and peripheral neuropathy in her legs. He has tried and failed Cymbalta, Lyrica, gabapentin, and oxycodone. She continues to have sharp shooting lancing pains that radiate down her legs into her feet. Celebrex has been little benefit. He also reports crepitus and pain in her neck. She denies any cervical radiculopathy in the arms. She denies any arm weakness. However she does report shooting pains that radiate from her neck into her occiput and around her temples. These headaches occur once or twice a day and lasts seconds to minutes. They seem to be triggered by movement of the head and the neck. She also reports dull pain in the neck constantly. She is scheduled to have a colonoscopy to evaluate the cause of her chronic diarrhea.  At that time, my plan was:  Patient certainly has peripheral neuropathy in her legs likely due to lumbar spinal stenosis and lumbar radiculopathy. She also has neck pain I suspect from spondylosis in the neck and most likely degenerative disc disease in the neck. I suspect cervicogenic headaches. Therefore I will switch the patient from Celebrex to diclofenac 75 mg by mouth twice a day as an anti-inflammatory to treat the root cause of her neck pain and most likely the etiology of her headaches. I will also start the patient on amitriptyline 50 mg by mouth daily to see if I can help alleviate some of the neuropathic pain and also hopefully help prevent some of the headaches. Recheck in 2 weeks or sooner if worse.  03/04/15 Patient is here for recheck. MRI in the lumbar spine obtained in 2011 revealed: IMPRESSION:  1. Severe multilevel degenerative disc disease worst at L5-S1. At L5-S1, there is severe bilateral facet arthrosis, grade 1 anterolisthesis, bilateral lateral recess and bilateral  foraminal stenosis. 2. L4-L5 degenerated disc with left foraminal stenosis that is multifactorial. Mild to moderate central and left greater than right lateral recess stenosis. 3. Degenerative disc disease at other levels with a right lateral L2-L3 disc protrusion and osteophytes. 4. In this patient with predominately left-sided symptoms, suspect disease at L4-L5 and L5-S1 as well as multilevel facet arthrosis.  We have referred the patient to a pain clinic. She was deemed to be a poor surgical candidate. At the pain clinic they tried epidural steroidal injections that provided the patient with very little benefit. She is tried gabapentin which provided her no pain relief. She tried Cymbalta which made her feel terrible and so she stopped it. She tried Lyrica which made her feel "like a zombie". Amitriptyline provided her no relief with her neuropathy. She is tried hydrocodone oxycodone and even OxyContin to the pain clinic with very little relief. She is here today for follow-up. She has not been taking the diclofenac that I prescribed to her last time because she lost the pill bottle. The only benefit she saw from amitriptyline was that it seemed to help her sleep. She denied any constipation. However she does have some mild memory impairment and I hesitate to keep patient on amitriptyline and less she is truly benefiting from a neuropathic pain standpoint   Past Medical History  Diagnosis Date  . Asthma     occassional usage of inhalers  . Seasonal allergies   . Depression   . ADD (attention deficit disorder)   . Hypertension   .  Cancer     colon cancer, 16 yrs ago  . Bowel obstruction   . Neuromuscular disorder     neuropathy  . Arthritis   . Spinal stenosis   . Scoliosis   . Cataract   . GERD (gastroesophageal reflux disease)   . Osteoporosis   . Aortic aneurysm    Past Surgical History  Procedure Laterality Date  . Sigmoid resection / rectopexy    . Colonoscopy      2  yrs ago  . Eye surgery      cataracts bilat  . Joint replacement      bilat shoulders  . Fracture surgery      thoracic spine  . Abdominal hysterectomy  1972    partial  . Spinal cord stimulator insertion  11/18/2011    Procedure: LUMBAR SPINAL CORD STIMULATOR INSERTION;  Surgeon: Dahlia Bailiff, MD;  Location: [facility];  Service: Orthopedics;  Laterality: N/A;  Thoracic 9 laminiotomy, SPINAL CORD STIMULATOR PLACEMENT thoracic 9- Thoracic 7   Current Outpatient Prescriptions on File Prior to Visit  Medication Sig Dispense Refill  . amitriptyline (ELAVIL) 50 MG tablet Take 1 tablet (50 mg total) by mouth at bedtime. 30 tablet 0  . aspirin 81 MG chewable tablet Chew 81 mg by mouth every other day.    Marland Kitchen azelastine (ASTELIN) 0.1 % nasal spray Place 2 sprays into both nostrils 2 (two) times daily. Use in each nostril as directed 30 mL 12  . benzonatate (TESSALON) 100 MG capsule Take 1 capsule (100 mg total) by mouth every 8 (eight) hours. 30 capsule 0  . Calcium Carbonate-Vitamin D (CALCIUM + D) 600-200 MG-UNIT TABS Take 1 tablet by mouth daily.    . Carboxymethylcellul-Glycerin (OPTIVE SENSITIVE) 0.5-0.9 % SOLN Apply 1 drop to eye 3 (three) times daily. 45 mL 3  . cetirizine (ZYRTEC ALLERGY) 10 MG tablet TAKE 1 TABLET DAILY 30 tablet 1  . chlorhexidine (PERIDEX) 0.12 % solution Use as directed 15 mLs in the mouth or throat 2 (two) times daily as needed. 480 mL 3  . cyanocobalamin 2000 MCG tablet Take 2,000 mcg by mouth daily.    . diphenoxylate-atropine (LOMOTIL) 2.5-0.025 MG per tablet Take 2 tablets by mouth 4 (four) times daily as needed for diarrhea or loose stools. 30 tablet 0  . DULoxetine (CYMBALTA) 30 MG capsule Take 1 pill a day for 1 week then increase to 2 pills a day thereafter. 60 capsule 3  . fluticasone (FLONASE) 50 MCG/ACT nasal spray USE 2 SPRAYS IN EACH NOSTRIL ONCE DAILY 16 g 3  . losartan (COZAAR) 50 MG tablet TAKE 1 TABLET DAILY 30 tablet 1  . losartan (COZAAR) 50 MG tablet  TAKE 1 TABLET DAILY 90 tablet 3  . Melatonin 10 MG TABS Take 1 tablet by mouth at bedtime.    . Multiple Vitamin (MULITIVITAMIN WITH MINERALS) TABS Take 1 tablet by mouth daily.    . Omega-3 Fatty Acids (FISH OIL) 1200 MG CPDR Take by mouth.    . pantoprazole (PROTONIX) 40 MG tablet TAKE 1 TABLET DAILY 90 tablet 3  . PATADAY 0.2 % SOLN INSTILL 1 DROP IN BOTH EYES DAILY 3 Bottle 2  . PROAIR HFA 108 (90 BASE) MCG/ACT inhaler INHALE 2 PUFFS INTO THE LUNGS EVERY 6 HOURS AS NEEDED FOR SHORTNESS OF BREATH 3 each 3  . Propylene Glycol (SYSTANE BALANCE OP) Apply 1 drop to eye as needed. For dry eyes    . ranitidine (ZANTAC) 150 MG tablet Take  1 tablet (150 mg total) by mouth 2 (two) times daily. 180 tablet 3  . rifaximin (XIFAXAN) 550 MG TABS tablet Take 550 mg by mouth 2 (two) times daily.     No current facility-administered medications on file prior to visit.   Allergies  Allergen Reactions  . Cymbalta [Duloxetine Hcl] Other (See Comments)    Makes me lethargic  . Lyrica [Pregabalin] Other (See Comments)    Makes me lethargic  . Ranitidine Diarrhea  . Celebrex [Celecoxib] Rash  . Naprosyn [Naproxen] Rash   History   Social History  . Marital Status: Married    Spouse Name: N/A  . Number of Children: N/A  . Years of Education: N/A   Occupational History  . Not on file.   Social History Main Topics  . Smoking status: Former Smoker    Quit date: 10/11/1977  . Smokeless tobacco: Never Used  . Alcohol Use: No  . Drug Use: Yes    Special: Oxycodone  . Sexual Activity: Not on file   Other Topics Concern  . Not on file   Social History Narrative    Review of Systems  All other systems reviewed and are negative.      Objective:   Physical Exam  Constitutional: She is oriented to person, place, and time. She appears well-developed and well-nourished. No distress.  HENT:  Head: Normocephalic and atraumatic.  Right Ear: External ear normal.  Left Ear: External ear normal.    Nose: Nose normal.  Mouth/Throat: Oropharynx is clear and moist. No oropharyngeal exudate.  Eyes: Conjunctivae and EOM are normal. Pupils are equal, round, and reactive to light. Right eye exhibits no discharge. Left eye exhibits no discharge.  Neck: Neck supple.  Cardiovascular: Normal rate, regular rhythm and normal heart sounds.   No murmur heard. Pulmonary/Chest: Effort normal and breath sounds normal. No respiratory distress. She has no wheezes. She has no rales.  Abdominal: Soft. Bowel sounds are normal.  Lymphadenopathy:    She has no cervical adenopathy.  Neurological: She is alert and oriented to person, place, and time. She has normal reflexes. No cranial nerve deficit. She exhibits normal muscle tone. Coordination normal.  Skin: She is not diaphoretic.  Vitals reviewed.         Assessment & Plan:  Neuropathy of lower extremity, unspecified laterality  At this point the patient continues to have pain primarily in her lower back and down her legs. The pain is moderate to severe at times. I do want the patient to try taking the diclofenac 75 mg by mouth twice a day to help the arthritis, inflammation, and arthropathy in the lower lumbar spine which is contributing to her pain. Otherwise she is tried and failed every other agent for neuropathic pain I can think of. Therefore I did offer the patient  Oxycodone to be used on an as-needed basis versus tramadol. She would like to avoid the strong-type painkillers at the present time would like to just continue the diclofenac. If the pain worsens at that point I will place the patient on immediate release pain medication such as oxycodone or tramadol. I would avoid  Extended release preparations given her memory impairment and her age due to the risk of falls.

## 2015-03-06 DIAGNOSIS — R140 Abdominal distension (gaseous): Secondary | ICD-10-CM | POA: Diagnosis not present

## 2015-03-06 DIAGNOSIS — K580 Irritable bowel syndrome with diarrhea: Secondary | ICD-10-CM | POA: Diagnosis not present

## 2015-03-06 DIAGNOSIS — K219 Gastro-esophageal reflux disease without esophagitis: Secondary | ICD-10-CM | POA: Diagnosis not present

## 2015-03-07 ENCOUNTER — Encounter: Payer: Self-pay | Admitting: Cardiology

## 2015-03-07 ENCOUNTER — Ambulatory Visit (INDEPENDENT_AMBULATORY_CARE_PROVIDER_SITE_OTHER): Payer: Medicare Other | Admitting: Cardiology

## 2015-03-07 VITALS — BP 150/80 | HR 90 | Ht 59.0 in | Wt 155.0 lb

## 2015-03-07 DIAGNOSIS — R072 Precordial pain: Secondary | ICD-10-CM

## 2015-03-07 DIAGNOSIS — R0789 Other chest pain: Secondary | ICD-10-CM | POA: Diagnosis not present

## 2015-03-07 NOTE — Patient Instructions (Signed)
Your physician recommends that you schedule a follow-up appointment in: 3 months with Dr. Hochrein  

## 2015-03-07 NOTE — Progress Notes (Signed)
Cardiology Office Note   Date:  03/07/2015   ID:  BROGAN ENGLAND, DOB [birth year], MRN [id number]  PCP:  Odette Fraction, MD  Cardiologist:   Minus Breeding, MD   Chief Complaint  Patient presents with  . Chest Pain      History of Present Illness: Brianna Jacobs is a 81 y.o. female who presents for evaluation of chest discomfort. She has no past cardiac history although there apparently was a stress test in 2010.  He has been somewhat of a chronic problem. It happens with movement and with activity such as lifting things. She has some severe back discomfort and she really thinks that this radiates around from her back. She's not describing as substernal discomfort. The discomfort is sharp to dull and moderate in intensity. It might last for about 5 minutes. She doesn't describe neck or arm discomfort. She doesn't describe associated shortness of breath, nausea or vomiting. She might have some diaphoresis. She is limited her activities and walks to the grocery store holding onto a cart.  Of note recently during a colonoscopy were planning on canceling it apparently because of some dysrhythmia. She does have PACs but she certainly doesn't feel these. She's never had presyncope or syncope.  Past Medical History  Diagnosis Date  . Asthma     occassional usage of inhalers  . Seasonal allergies   . Depression   . ADD (attention deficit disorder)   . Hypertension   . Cancer     colon cancer, 16 yrs ago  . Bowel obstruction   . Neuromuscular disorder     neuropathy  . Arthritis   . Spinal stenosis   . Scoliosis   . Cataract   . GERD (gastroesophageal reflux disease)   . Osteoporosis   . Aortic aneurysm     Past Surgical History  Procedure Laterality Date  . Sigmoid resection / rectopexy    . Colonoscopy      2 yrs ago  . Eye surgery      cataracts bilat  . Joint replacement      bilat shoulders  . Fracture surgery      thoracic spine  . Abdominal hysterectomy  1972      partial  . Spinal cord stimulator insertion  11/18/2011    Procedure: LUMBAR SPINAL CORD STIMULATOR INSERTION;  Surgeon: Dahlia Bailiff, MD;  Location: [facility];  Service: Orthopedics;  Laterality: N/A;  Thoracic 9 laminiotomy, SPINAL CORD STIMULATOR PLACEMENT thoracic 9- Thoracic 7     Current Outpatient Prescriptions  Medication Sig Dispense Refill  . aspirin 81 MG chewable tablet Chew 81 mg by mouth every other day.    Marland Kitchen azelastine (ASTELIN) 0.1 % nasal spray Place 2 sprays into both nostrils 2 (two) times daily. Use in each nostril as directed 30 mL 12  . benzonatate (TESSALON) 100 MG capsule Take 1 capsule (100 mg total) by mouth every 8 (eight) hours. 30 capsule 0  . Calcium Carbonate-Vitamin D (CALCIUM + D) 600-200 MG-UNIT TABS Take 1 tablet by mouth daily.    . Carboxymethylcellul-Glycerin (OPTIVE SENSITIVE) 0.5-0.9 % SOLN Apply 1 drop to eye 3 (three) times daily. 45 mL 3  . cetirizine (ZYRTEC ALLERGY) 10 MG tablet TAKE 1 TABLET DAILY 30 tablet 1  . chlorhexidine (PERIDEX) 0.12 % solution Use as directed 15 mLs in the mouth or throat 2 (two) times daily as needed. 480 mL 3  . cyanocobalamin 2000 MCG tablet Take 2,000 mcg by mouth  daily.    . diphenoxylate-atropine (LOMOTIL) 2.5-0.025 MG per tablet Take 2 tablets by mouth 4 (four) times daily as needed for diarrhea or loose stools. 30 tablet 0  . fluticasone (FLONASE) 50 MCG/ACT nasal spray USE 2 SPRAYS IN EACH NOSTRIL ONCE DAILY 16 g 3  . losartan (COZAAR) 50 MG tablet TAKE 1 TABLET DAILY 90 tablet 3  . Melatonin 10 MG TABS Take 1 tablet by mouth at bedtime.    . Multiple Vitamin (MULITIVITAMIN WITH MINERALS) TABS Take 1 tablet by mouth daily.    . pantoprazole (PROTONIX) 40 MG tablet TAKE 1 TABLET DAILY 90 tablet 3  . PATADAY 0.2 % SOLN INSTILL 1 DROP IN BOTH EYES DAILY 3 Bottle 2  . PROAIR HFA 108 (90 BASE) MCG/ACT inhaler INHALE 2 PUFFS INTO THE LUNGS EVERY 6 HOURS AS NEEDED FOR SHORTNESS OF BREATH 3 each 3  . Propylene Glycol  (SYSTANE BALANCE OP) Apply 1 drop to eye as needed. For dry eyes    . ranitidine (ZANTAC) 150 MG tablet Take 1 tablet (150 mg total) by mouth 2 (two) times daily. 180 tablet 3   No current facility-administered medications for this visit.    Allergies:   Cymbalta; Lyrica; Ranitidine; Celebrex; and Naprosyn    Social History:  The patient  reports that she quit smoking about 37 years ago. She has never used smokeless tobacco. She reports that she uses illicit drugs (Oxycodone). She reports that she does not drink alcohol.   Family History:  The patient's family history includes Bladder Cancer in her sister; Breast cancer in her sister; Colon cancer in her mother; Heart disease in her brother and father; Prostate cancer in her brother.    ROS:  Please see the history of present illness.   Otherwise, review of systems are positive for none.   All other systems are reviewed and negative.    PHYSICAL EXAM: VS:  BP 150/80 mmHg  Pulse 90  Ht 4\' 11"  (1.499 m)  Wt 155 lb (70.308 kg)  BMI 31.29 kg/m2 , BMI Body mass index is 31.29 kg/(m^2). GENERAL:  Well appearing HEENT:  Pupils equal round and reactive, fundi not visualized, oral mucosa unremarkable NECK:  No jugular venous distention, waveform within normal limits, carotid upstroke brisk and symmetric, no bruits, no thyromegaly LYMPHATICS:  No cervical, inguinal adenopathy LUNGS:  Clear to auscultation bilaterally BACK:  No CVA tenderness CHEST:  Unremarkable HEART:  PMI not displaced or sustained,S1 and S2 within normal limits, no S3, no S4, no clicks, no rubs, no murmurs ABD:  Flat, positive bowel sounds normal in frequency in pitch, no bruits, no rebound, no guarding, no midline pulsatile mass, no hepatomegaly, no splenomegaly EXT:  2 plus pulses throughout, no edema, no cyanosis no clubbing SKIN:  No rashes no nodules NEURO:  Cranial nerves II through XII grossly intact, motor grossly intact throughout PSYCH:  Cognitively intact,  oriented to person place and time    EKG:  EKG is ordered today. The ekg ordered today demonstrates sinus rhythm with premature atrial contractions, rate 90, left axis deviation, first-degree AV block, no acute ST-T wave changes.   Recent Labs: No results found for requested labs within last 365 days.    Lipid Panel    Component Value Date/Time   CHOL 225* 02/08/2014 0908   TRIG 69 02/08/2014 0908   HDL 80 02/08/2014 0908   CHOLHDL 2.8 02/08/2014 0908   VLDL 14 02/08/2014 0908   LDLCALC 131* 02/08/2014 0908  Wt Readings from Last 3 Encounters:  03/07/15 155 lb (70.308 kg)  03/04/15 154 lb (69.854 kg)  01/27/15 151 lb (68.493 kg)      Other studies Reviewed: Additional studies/ records that were reviewed today include: None.    ASSESSMENT AND PLAN:  CHEST PAIN:  Her pain is very atypical. I think the pretest probability of obstructive coronary disease as an etiology is very low. Therefore, I'm not planning further cardiovascular testing. I would certainly like to talk to her if her symptoms worsen or are alternatively not thought to be related to her back. I will see her back.  CAROTID STENOSIS:  This was mild and we will follow it up in a couple of years.  PACS:   The patient does not feel these. No change in therapy is indicated.   Current medicines are reviewed at length with the patient today.  The patient does not have concerns regarding medicines.  The following changes have been made:  no change  Labs/ tests ordered today include:  Orders Placed This Encounter  Procedures  . EKG 12-Lead     Disposition:   FU with me in 3 months.     Signed, Minus Breeding, MD  03/07/2015 10:55 AM    [place]

## 2015-04-03 ENCOUNTER — Ambulatory Visit (INDEPENDENT_AMBULATORY_CARE_PROVIDER_SITE_OTHER): Payer: Medicare Other | Admitting: Family Medicine

## 2015-04-03 ENCOUNTER — Encounter: Payer: Self-pay | Admitting: Family Medicine

## 2015-04-03 VITALS — BP 120/78 | HR 76 | Temp 98.7°F | Resp 18 | Ht 59.5 in | Wt 152.0 lb

## 2015-04-03 DIAGNOSIS — H9312 Tinnitus, left ear: Secondary | ICD-10-CM | POA: Diagnosis not present

## 2015-04-03 NOTE — Progress Notes (Signed)
Subjective:    Patient ID: Brianna Jacobs, female    DOB: 1930/09/30, 81 y.o.   MRN: [id number]  HPI  Patient is here today with several concerns. She has several questions regarding her medication. I answered all his questions over a period of about 15 minutes. Her only medical concern today is tinnitus in her left ear. This is been going on now for several years and is gradually worsening. She also reports hearing loss in left ear. On examination today there is no cerumen impaction. Past Medical History  Diagnosis Date  . Asthma     occassional usage of inhalers  . Depression   . ADD (attention deficit disorder)   . Hypertension   . Cancer     colon cancer, 16 yrs ago  . Bowel obstruction   . Neuromuscular disorder     neuropathy  . Arthritis   . Spinal stenosis   . Scoliosis   . Cataract   . GERD (gastroesophageal reflux disease)   . Osteoporosis    Past Surgical History  Procedure Laterality Date  . Sigmoid resection / rectopexy    . Colonoscopy      2 yrs ago  . Eye surgery      cataracts bilat  . Joint replacement      bilat shoulders  . Fracture surgery      thoracic spine  . Abdominal hysterectomy  1972    partial  . Spinal cord stimulator insertion  11/18/2011    Procedure: LUMBAR SPINAL CORD STIMULATOR INSERTION;  Surgeon: Dahlia Bailiff, MD;  Location: [facility];  Service: Orthopedics;  Laterality: N/A;  Thoracic 9 laminiotomy, SPINAL CORD STIMULATOR PLACEMENT thoracic 9- Thoracic 7   Current Outpatient Prescriptions on File Prior to Visit  Medication Sig Dispense Refill  . aspirin 81 MG chewable tablet Chew 81 mg by mouth every other day.    . Calcium Carbonate-Vitamin D (CALCIUM + D) 600-200 MG-UNIT TABS Take 1 tablet by mouth daily.    . Carboxymethylcellul-Glycerin (OPTIVE SENSITIVE) 0.5-0.9 % SOLN Apply 1 drop to eye 3 (three) times daily. 45 mL 3  . cetirizine (ZYRTEC ALLERGY) 10 MG tablet TAKE 1 TABLET DAILY 30 tablet 1  . chlorhexidine (PERIDEX) 0.12 %  solution Use as directed 15 mLs in the mouth or throat 2 (two) times daily as needed. 480 mL 3  . cyanocobalamin 2000 MCG tablet Take 2,000 mcg by mouth daily.    . fluticasone (FLONASE) 50 MCG/ACT nasal spray USE 2 SPRAYS IN EACH NOSTRIL ONCE DAILY 16 g 3  . losartan (COZAAR) 50 MG tablet TAKE 1 TABLET DAILY 90 tablet 3  . Melatonin 10 MG TABS Take 1 tablet by mouth at bedtime.    . Multiple Vitamin (MULITIVITAMIN WITH MINERALS) TABS Take 1 tablet by mouth daily.    . pantoprazole (PROTONIX) 40 MG tablet TAKE 1 TABLET DAILY 90 tablet 3  . PATADAY 0.2 % SOLN INSTILL 1 DROP IN BOTH EYES DAILY 3 Bottle 2  . PROAIR HFA 108 (90 BASE) MCG/ACT inhaler INHALE 2 PUFFS INTO THE LUNGS EVERY 6 HOURS AS NEEDED FOR SHORTNESS OF BREATH 3 each 3  . Propylene Glycol (SYSTANE BALANCE OP) Apply 1 drop to eye as needed. For dry eyes    . ranitidine (ZANTAC) 150 MG tablet Take 1 tablet (150 mg total) by mouth 2 (two) times daily. 180 tablet 3  . benzonatate (TESSALON) 100 MG capsule Take 1 capsule (100 mg total) by mouth every 8 (eight)  hours. (Patient not taking: Reported on 04/03/2015) 30 capsule 0   No current facility-administered medications on file prior to visit.   Allergies  Allergen Reactions  . Cymbalta [Duloxetine Hcl] Other (See Comments)    Makes me lethargic  . Lyrica [Pregabalin] Other (See Comments)    Makes me lethargic  . Ranitidine Diarrhea  . Celebrex [Celecoxib] Rash  . Naprosyn [Naproxen] Rash   History   Social History  . Marital Status: Married    Spouse Name: N/A  . Number of Children: 3  . Years of Education: N/A   Occupational History  . Not on file.   Social History Main Topics  . Smoking status: Former Smoker    Quit date: 10/11/1977  . Smokeless tobacco: Never Used  . Alcohol Use: No  . Drug Use: Yes    Special: Oxycodone  . Sexual Activity: Not on file   Other Topics Concern  . Not on file   Social History Narrative   Lives at home with husband.       Review of Systems  All other systems reviewed and are negative.      Objective:   Physical Exam  Constitutional: She appears well-developed and well-nourished.  HENT:  Right Ear: Tympanic membrane and ear canal normal.  Left Ear: Tympanic membrane and ear canal normal.  Cardiovascular: Normal rate and normal heart sounds.   Vitals reviewed.         Assessment & Plan:  Tinnitus aurium, left - Plan: Ambulatory referral to Audiology  I believe the patient would benefit from a hearing evaluation to determine if she has sensorineural hearing loss contributing to the tinnitus. If she does she may benefit from a hearing aid to help diminish and suppress the tinnitus by masking it with sound.

## 2015-05-02 ENCOUNTER — Other Ambulatory Visit: Payer: Self-pay | Admitting: Family Medicine

## 2015-05-02 MED ORDER — BENZONATATE 100 MG PO CAPS: 100.0000 mg | ORAL_CAPSULE | Freq: Three times a day (TID) | ORAL | Status: DC

## 2015-05-02 MED ORDER — AMOXICILLIN 500 MG PO CAPS: ORAL_CAPSULE | ORAL | Status: DC

## 2015-05-15 ENCOUNTER — Ambulatory Visit: Payer: Medicare Other | Attending: Audiology | Admitting: Audiology

## 2015-05-15 DIAGNOSIS — H905 Unspecified sensorineural hearing loss: Secondary | ICD-10-CM | POA: Diagnosis not present

## 2015-05-15 DIAGNOSIS — H9312 Tinnitus, left ear: Secondary | ICD-10-CM | POA: Insufficient documentation

## 2015-05-15 NOTE — Procedures (Signed)
[facility] [address] [place], [place]  [address] [place] EVALUATION  Patient Name: Brianna Jacobs  Medical Record Number:  [id number] Date of Birth:  08/19/1930     Date of Test:  05/15/2015  HISTORY:  Yides Saidi, an 81 y.o. old female was seen for audiological evaluation upon referral of PICKARD,WARREN TOM, MD.   The patient reported tinnitus on the left side since childhood which is constant and sounds like a "whistle".  A hearing evaluation in 1994 did not indicate a hearing loss and further recommendations were not made according to the patient.  She stated that she is an excessive worrier and is not sleeping well which is confounded by the sound of the tinnitus.  Her concerns regarding her husband's up coming heart surgery and her daughter's shoulder surgery are paramount and therefore is not interested in treatment until those events are behind her.  In her own opinion she hears well, but would simply like relief from her tinnitus.  There is no significant history of occupational noise exposure, head injury dizziness or facial numbness.  Lastly, she reports a diagnosis of ADD for which she reports is treated with 50mg  Coozar and is also used to lower her blood pressure.  A review of this medication does indicate tinnitus as a side effect but not at a significant rate.  REPORT OF PAIN:  None  EVALUATION:   Air and bone conduction audiometry from 500Hz  - 8000Hz  utilizing insert earphones revealed on the right side, a mild low frequency hearing loss that is slopping upward to normal at 2000Hz  and 4000Hz  which then sloped back town to a mild to severe sensory neural loss from 3000Hz  - 8000Hz .  On the left side, testing again revealed an upward slopping moderate to slight loss from 500Hz  -2000Hz  and then slopping back down from 40dBHL to 75dBHL from 4000Hz  - 8000Hz .   A tinnitus match was found at 6000 - 8000Hz .  Speech  reception thresholds were consistent with the pure tone results indicative of good test reliability.  Speech recognition testing was conducted in each ear independently, at a comfortable listening level (60-70dBHL) and indicated 100% and 96% in the right and left ears respectively.  Speech recognition abilities while in the presence of a soft background noise were not evaluated. Immitance Audiometry was utilized and Type A Tympanograms were obtained on both sides.  Acoustic reflexes were tested from 500Hz  - 4000Hz  and were present bilaterally.  Additionally, Distortion Product Otoacoustic Emissions were tested from 2000Hz  - 10000Hz  and were weak or absent above 4000Hz  on both sides indicative of impaired outer hair cell function within those regions.  CONCLUSION:   Bilateral sensory neural hearing loss which follows the same pattern but slightly worse on the left side and peaks at normal or near normal at 2000Hz .  Speech recognition is good on both sides in quiet, however that is mostly likely not the case in background noise.  Middle ear function is good bilaterally. It would appear that her tinnitus is left side peripheral but may be central as well upon her report.  RECOMMENDATIONS:    1. Hearing should be monitored at least on an annual basis and sooner should there be an increase in symptoms.  2. She does not appear to be interested in a hearing aid evaluation at this time.  It was explained that amplification would assist her communication abilities and at the same time fatigue her tinnitus while the  hearing aid was in and for a few hours after it was removed, giving her some degree of over night relief as well.  It was further discussed that stress will exasperate her tinnitus and stress management techniques such as mediatation were discussed and she was receptive.  Lastly, she was given information for audiologists that dispense hearing aids as well as the [facility].        It was a pleasure meeting Mrs. Sardo and her husband and I appreciate you allowing me to share in her care.  Please let me know if I may be of other assistance.     Ivonne Andrew Pugh, Au.D. Doctor of Audiology CCC-A 05/15/2015

## 2015-05-23 DIAGNOSIS — H1045 Other chronic allergic conjunctivitis: Secondary | ICD-10-CM | POA: Diagnosis not present

## 2015-06-12 ENCOUNTER — Ambulatory Visit: Payer: Medicare Other | Admitting: Cardiology

## 2015-06-17 ENCOUNTER — Encounter: Payer: Self-pay | Admitting: Cardiology

## 2015-06-17 ENCOUNTER — Ambulatory Visit (INDEPENDENT_AMBULATORY_CARE_PROVIDER_SITE_OTHER): Payer: Medicare Other | Admitting: Cardiology

## 2015-06-17 VITALS — BP 148/68 | HR 73 | Ht 59.5 in | Wt 156.2 lb

## 2015-06-17 DIAGNOSIS — I719 Aortic aneurysm of unspecified site, without rupture: Secondary | ICD-10-CM

## 2015-06-17 DIAGNOSIS — R072 Precordial pain: Secondary | ICD-10-CM | POA: Diagnosis not present

## 2015-06-17 NOTE — Patient Instructions (Signed)
Your physician recommends that you schedule a follow-up appointment as needed  

## 2015-06-17 NOTE — Progress Notes (Signed)
Cardiology Office Note   Date:  06/17/2015   ID:  Brianna Jacobs, DOB 07-20-1930, MRN [id number]  PCP:  Odette Fraction, MD  Cardiologist:   Minus Breeding, MD   Chief Complaint  Patient presents with  . Chest Pain  . Shortness of Breath  . Leg Swelling      History of Present Illness: Brianna Jacobs is a 81 y.o. female who presents for evaluation of chest discomfort. She has no past cardiac history although there apparently was a stress test in 2010.  Chest pain has been a chronic problem. It happens with movement and with activity such as lifting things. She has severe back pain that radiates around from her back. At this visit I thought her pain was atypical and I did not suggest further cardiovascular testing. Today talking with her again she describes pain that is unchanged from that description. She is not describing substernal discomfort. She doesn't have associated symptoms with this. Of note she's had PACs in the past. However, these are particularly bothering her.  She denies any presyncope or syncope.  Past Medical History  Diagnosis Date  . Asthma     occassional usage of inhalers  . Depression   . ADD (attention deficit disorder)   . Hypertension   . Cancer     colon cancer, 16 yrs ago  . Bowel obstruction   . Neuromuscular disorder     neuropathy  . Arthritis   . Spinal stenosis   . Scoliosis   . Cataract   . GERD (gastroesophageal reflux disease)   . Osteoporosis     Past Surgical History  Procedure Laterality Date  . Sigmoid resection / rectopexy    . Colonoscopy      2 yrs ago  . Eye surgery      cataracts bilat  . Joint replacement      bilat shoulders  . Fracture surgery      thoracic spine  . Abdominal hysterectomy  1972    partial  . Spinal cord stimulator insertion  11/18/2011    Procedure: LUMBAR SPINAL CORD STIMULATOR INSERTION;  Surgeon: Dahlia Bailiff, MD;  Location: [facility];  Service: Orthopedics;  Laterality: N/A;  Thoracic 9  laminiotomy, SPINAL CORD STIMULATOR PLACEMENT thoracic 9- Thoracic 7     Current Outpatient Prescriptions  Medication Sig Dispense Refill  . ALREX 0.2 % SUSP Place 1 drop into both eyes 2 (two) times daily.    Marland Kitchen amoxicillin (AMOXIL) 500 MG capsule 4 tabs po 1 hour prior to dental work 4 capsule 0  . aspirin 81 MG chewable tablet Chew 81 mg by mouth every other day.    . benzonatate (TESSALON) 100 MG capsule Take 1 capsule (100 mg total) by mouth every 8 (eight) hours. 30 capsule 0  . Calcium Carbonate-Vitamin D (CALCIUM + D) 600-200 MG-UNIT TABS Take 1 tablet by mouth daily.    . Carboxymethylcellul-Glycerin (OPTIVE SENSITIVE) 0.5-0.9 % SOLN Apply 1 drop to eye 3 (three) times daily. 45 mL 3  . cetirizine (ZYRTEC ALLERGY) 10 MG tablet TAKE 1 TABLET DAILY 30 tablet 1  . chlorhexidine (PERIDEX) 0.12 % solution Use as directed 15 mLs in the mouth or throat 2 (two) times daily as needed. 480 mL 3  . cyanocobalamin 2000 MCG tablet Take 2,000 mcg by mouth daily.    . fluticasone (FLONASE) 50 MCG/ACT nasal spray USE 2 SPRAYS IN EACH NOSTRIL ONCE DAILY 16 g 3  . losartan (COZAAR) 50  MG tablet TAKE 1 TABLET DAILY 90 tablet 3  . Melatonin 10 MG TABS Take 1 tablet by mouth at bedtime.    . Multiple Vitamin (MULITIVITAMIN WITH MINERALS) TABS Take 1 tablet by mouth daily.    . pantoprazole (PROTONIX) 40 MG tablet TAKE 1 TABLET DAILY 90 tablet 3  . PATADAY 0.2 % SOLN INSTILL 1 DROP IN BOTH EYES DAILY 3 Bottle 2  . PROAIR HFA 108 (90 BASE) MCG/ACT inhaler INHALE 2 PUFFS INTO THE LUNGS EVERY 6 HOURS AS NEEDED FOR SHORTNESS OF BREATH 3 each 3  . Propylene Glycol (SYSTANE BALANCE OP) Apply 1 drop to eye as needed. For dry eyes    . ranitidine (ZANTAC) 150 MG tablet Take 1 tablet (150 mg total) by mouth 2 (two) times daily. 180 tablet 3  . UCERIS 9 MG TB24 Take 1 tablet by mouth daily.     No current facility-administered medications for this visit.    Allergies:   Cymbalta; Lyrica; Ranitidine; Celebrex;  and Naprosyn    ROS:  Please see the history of present illness.   Otherwise, review of systems are positive for insomnia and back pain.   All other systems are reviewed and negative.    PHYSICAL EXAM: VS:  BP 148/68 mmHg  Pulse 73  Ht 4' 11.5" (1.511 m)  Wt 156 lb 3.2 oz (70.852 kg)  BMI 31.03 kg/m2 , BMI Body mass index is 31.03 kg/(m^2). GENERAL:  Well appearing NECK:  No jugular venous distention, waveform within normal limits, carotid upstroke brisk and symmetric, no bruits, no thyromegaly LUNGS:  Clear to auscultation bilaterally CHEST:  Unremarkable HEART:  PMI not displaced or sustained,S1 and S2 within normal limits, no S3, no S4, no clicks, no rubs, no murmurs ABD:  Flat, positive bowel sounds normal in frequency in pitch, no bruits, no rebound, no guarding, no midline pulsatile mass, no hepatomegaly, no splenomegaly EXT:  2 plus pulses throughout, no edema, no cyanosis no clubbing   EKG:  EKG is ordered today. The ekg ordered today demonstrates sinus rhythm with premature atrial contractions, 73, left axis deviation, first-degree AV block, no acute ST-T wave changes.   Recent Labs: No results found for requested labs within last 365 days.    Wt Readings from Last 3 Encounters:  06/17/15 156 lb 3.2 oz (70.852 kg)  04/03/15 152 lb (68.947 kg)  03/07/15 155 lb (70.308 kg)      Other studies Reviewed: Additional studies/ records that were reviewed today include: None.    ASSESSMENT AND PLAN:  CHEST PAIN:  Her pain is very atypical and unchanged from previous. I think the pretest probability of obstructive coronary disease as an etiology is very low. Therefore, I'm not planning further cardiovascular testing.   CAROTID STENOSIS:  This was mild and we will follow it up in a couple of years.  PACS:   The patient does not feel these. No change in therapy is indicated.   Current medicines are reviewed at length with the patient today.  The patient does not have  concerns regarding medicines.  The following changes have been made:  no change  Labs/ tests ordered today include:  Orders Placed This Encounter  Procedures  . EKG 12-Lead     Disposition:   FU with me as needed.Ronnell Guadalajara, MD  06/17/2015 4:13 PM    [facility]

## 2015-06-19 ENCOUNTER — Telehealth: Payer: Self-pay | Admitting: Family Medicine

## 2015-06-19 NOTE — Telephone Encounter (Signed)
Patient would like to speak to you regarding some blood work  [phone]

## 2015-06-20 MED ORDER — BENZONATATE 100 MG PO CAPS: 100.0000 mg | ORAL_CAPSULE | Freq: Three times a day (TID) | ORAL | Status: DC

## 2015-06-20 NOTE — Telephone Encounter (Signed)
Pt is wanting a rx for the tessalon to go to her mail order as it is the only thing that helps her with her cough  Per [facility] ok to send  Med sent to requested pharm

## 2015-07-25 ENCOUNTER — Ambulatory Visit (INDEPENDENT_AMBULATORY_CARE_PROVIDER_SITE_OTHER): Payer: Medicare Other | Admitting: *Deleted

## 2015-07-25 DIAGNOSIS — Z23 Encounter for immunization: Secondary | ICD-10-CM

## 2015-07-28 ENCOUNTER — Telehealth: Payer: Self-pay | Admitting: Family Medicine

## 2015-07-28 NOTE — Telephone Encounter (Signed)
Pt called and states that she is having pain in her back again and wants to know what action she should take - should she see ortho, come here or try the Celebrex again? She refuses to take any sort of narcotic pain medication.  Informed pt to try the celebrex for a week and see if it helps with the pain and if it does not help or she develops itching to call and I would make her an appt with Dr. Dennard Schaumann.  She verbalized understanding and will call if problems arise.

## 2015-08-04 DIAGNOSIS — Z1231 Encounter for screening mammogram for malignant neoplasm of breast: Secondary | ICD-10-CM | POA: Diagnosis not present

## 2015-08-04 DIAGNOSIS — Z803 Family history of malignant neoplasm of breast: Secondary | ICD-10-CM | POA: Diagnosis not present

## 2015-08-04 LAB — HM MAMMOGRAPHY: HM MAMMO: NORMAL

## 2015-08-05 ENCOUNTER — Encounter: Payer: Self-pay | Admitting: Family Medicine

## 2015-08-25 DIAGNOSIS — H1045 Other chronic allergic conjunctivitis: Secondary | ICD-10-CM | POA: Diagnosis not present

## 2015-08-25 DIAGNOSIS — H04123 Dry eye syndrome of bilateral lacrimal glands: Secondary | ICD-10-CM | POA: Diagnosis not present

## 2015-08-25 DIAGNOSIS — H10413 Chronic giant papillary conjunctivitis, bilateral: Secondary | ICD-10-CM | POA: Diagnosis not present

## 2015-08-26 ENCOUNTER — Ambulatory Visit (INDEPENDENT_AMBULATORY_CARE_PROVIDER_SITE_OTHER): Payer: Medicare Other | Admitting: Family Medicine

## 2015-08-26 ENCOUNTER — Encounter: Payer: Self-pay | Admitting: Family Medicine

## 2015-08-26 VITALS — BP 128/68 | HR 72 | Temp 98.9°F | Resp 14 | Ht 59.5 in | Wt 154.0 lb

## 2015-08-26 DIAGNOSIS — L218 Other seborrheic dermatitis: Secondary | ICD-10-CM

## 2015-08-26 DIAGNOSIS — G8929 Other chronic pain: Secondary | ICD-10-CM | POA: Diagnosis not present

## 2015-08-26 DIAGNOSIS — L821 Other seborrheic keratosis: Secondary | ICD-10-CM

## 2015-08-26 DIAGNOSIS — L299 Pruritus, unspecified: Secondary | ICD-10-CM

## 2015-08-26 DIAGNOSIS — L219 Seborrheic dermatitis, unspecified: Secondary | ICD-10-CM

## 2015-08-26 DIAGNOSIS — M549 Dorsalgia, unspecified: Secondary | ICD-10-CM | POA: Diagnosis not present

## 2015-08-26 MED ORDER — TRIAMCINOLONE 0.1 % CREAM:EUCERIN CREAM 1:1: 1.0000 "application " | TOPICAL_CREAM | Freq: Two times a day (BID) | CUTANEOUS | Status: DC | PRN

## 2015-08-26 NOTE — Progress Notes (Signed)
Patient ID: GAZELLE MARLO, Brianna Jacobs   DOB: 1930/05/04, 81 y.o.   MRN: [id number]   Subjective:    Patient ID: Brianna Jacobs, Brianna Jacobs    DOB: Oct 26, 1929, 81 y.o.   MRN: [id number]  Patient presents for Pain and Referral pt here for referral to dermatology. She states that she itches all over this is been going on for a couple of years. She's tried multiple over-the-counter lotions and soaps. She also noticed a few spots again up on her arm and on her back. She is not sure how long they've been there. He had one spot that came up on her chin  like a pimple it is now drying up. She is using a little peroxide as well as soap and water. She also gets dry itchy scalp. She was told she has seborrheic dermatitis.    She has chronic back pain peripheral neuropathy and spinal stenosis. I reviewed her chart from her primary care provider she's been on every medication including narcotics and neuropathic medication she is seeing multiple specialists she has had epidural injections she has had kyphoplasty is nothing else that can be done she is not a surgical candidate.    Review Of Systems:  GEN- denies fatigue, fever, weight loss,weakness, recent illness HEENT- denies eye drainage, change in vision, nasal discharge, CVS- denies chest pain, palpitations RESP- denies SOB, cough, wheeze ABD- denies N/V, change in stools, abd pain GU- denies dysuria, hematuria, dribbling, incontinence MSK- + joint pain, muscle aches, injury Neuro- denies headache, dizziness, syncope, seizure activity       Objective:    BP 128/68 mmHg  Pulse 72  Temp(Src) 98.9 F (37.2 C) (Oral)  Resp 14  Ht 4' 11.5" (1.511 m)  Wt 154 lb (69.854 kg)  BMI 30.60 kg/m2 GEN- NAD, alert and oriented x3 CVS- RRR, no murmur RESP-CTAB Skin- multiple, angiomas scattered on abdomen, back, multiple seb keratosis,scabbed lesion on chin, no erythema, no drainage, age spots, with thinning skin. Scalp mild flakiness EXT- No edema Pulses-  Radial 2+        Assessment & Plan:      Problem List Items Addressed This Visit    Chronic back pain - Primary    No further intervention, shes uses NSAIDS as needed, could not give me the name, nothing further to offer at this time,       Other Visit Diagnoses    Seborrheic dermatitis of scalp        Trial of Nizoral    Seborrheic keratoses        Multiple lesions, all benign appearing, refer to derm at pt request    Pruritic condition        pruritis with no specific rash, advised to restart zyrtec, will try TAC 1:1 with Eucerin, multiple other modalites tried        Note: This dictation was prepared with Dragon dictation along with smaller phrase technology. Any transcriptional errors that result from this process are unintentional.

## 2015-08-26 NOTE — Patient Instructions (Addendum)
Zyrtec once a day  Compounding cream of steroid and Eucerin Dermatology referral Nizoral - blue bottle- try for dermatitis  F/U as needed

## 2015-08-26 NOTE — Assessment & Plan Note (Signed)
No further intervention, shes uses NSAIDS as needed, could not give me the name, nothing further to offer at this time,

## 2015-08-28 ENCOUNTER — Other Ambulatory Visit: Payer: Self-pay | Admitting: *Deleted

## 2015-08-28 ENCOUNTER — Encounter: Payer: Self-pay | Admitting: *Deleted

## 2015-08-28 MED ORDER — TRIAMCINOLONE 0.1 % CREAM:EUCERIN CREAM 1:1: 1.0000 "application " | TOPICAL_CREAM | Freq: Two times a day (BID) | CUTANEOUS | Status: DC | PRN

## 2015-08-28 NOTE — Telephone Encounter (Signed)
Received call from patient.   Reports that [facility] cannot compound Triamcinolone Eucerin Cream.   Requested to have cream sent to [facility].   Prescription printed and will be faxed.

## 2015-08-28 NOTE — Telephone Encounter (Signed)
This encounter was created in error - please disregard.

## 2015-08-29 ENCOUNTER — Other Ambulatory Visit: Payer: Self-pay | Admitting: Family Medicine

## 2015-09-08 ENCOUNTER — Other Ambulatory Visit: Payer: Self-pay | Admitting: Family Medicine

## 2015-09-10 DIAGNOSIS — D692 Other nonthrombocytopenic purpura: Secondary | ICD-10-CM | POA: Diagnosis not present

## 2015-09-10 DIAGNOSIS — L821 Other seborrheic keratosis: Secondary | ICD-10-CM | POA: Diagnosis not present

## 2015-09-10 DIAGNOSIS — R202 Paresthesia of skin: Secondary | ICD-10-CM | POA: Diagnosis not present

## 2015-09-10 DIAGNOSIS — D485 Neoplasm of uncertain behavior of skin: Secondary | ICD-10-CM | POA: Diagnosis not present

## 2015-10-02 ENCOUNTER — Ambulatory Visit (INDEPENDENT_AMBULATORY_CARE_PROVIDER_SITE_OTHER): Payer: Medicare Other | Admitting: Family Medicine

## 2015-10-02 ENCOUNTER — Encounter: Payer: Self-pay | Admitting: Family Medicine

## 2015-10-02 VITALS — BP 142/70 | HR 78 | Temp 99.7°F | Ht 59.0 in | Wt 157.0 lb

## 2015-10-02 DIAGNOSIS — J208 Acute bronchitis due to other specified organisms: Secondary | ICD-10-CM

## 2015-10-02 MED ORDER — AZITHROMYCIN 250 MG PO TABS: ORAL_TABLET | ORAL | Status: DC

## 2015-10-02 NOTE — Progress Notes (Signed)
Subjective:    Patient ID: Brianna Jacobs, female    DOB: 1930-03-23, 81 y.o.   MRN: [id number]  HPI  Symptoms began almost 7 days ago with a viral upper respiratory infection with rhinorrhea. However over the last 48 hours, the patient has developed fevers to 101, worsening cough, shortness of breath, and wheezing. She also reports dull headache. She denies any pain in her sinuses. She continues to have rhinorrhea. She denies any sore throat. However the cough and chest congestion has gotten drastically worse. On examination today there are no wheezes crackles Rales. There is no respiratory distress. Patient demonstrates normal air movement. Past Medical History  Diagnosis Date  . Asthma     occassional usage of inhalers  . Depression   . ADD (attention deficit disorder)   . Hypertension   . Cancer [facility])     colon cancer, 16 yrs ago  . Bowel obstruction ([place])   . Neuromuscular disorder (HCC)     neuropathy  . Arthritis   . Spinal stenosis   . Scoliosis   . Cataract   . GERD (gastroesophageal reflux disease)   . Osteoporosis    Past Surgical History  Procedure Laterality Date  . Sigmoid resection / rectopexy    . Colonoscopy      2 yrs ago  . Eye surgery      cataracts bilat  . Joint replacement      bilat shoulders  . Fracture surgery      thoracic spine  . Abdominal hysterectomy  1972    partial  . Spinal cord stimulator insertion  11/18/2011    Procedure: LUMBAR SPINAL CORD STIMULATOR INSERTION;  Surgeon: Dahlia Bailiff, MD;  Location: [place];  Service: Orthopedics;  Laterality: N/A;  Thoracic 9 laminiotomy, SPINAL CORD STIMULATOR PLACEMENT thoracic 9- Thoracic 7   Current Outpatient Prescriptions on File Prior to Visit  Medication Sig Dispense Refill  . ALREX 0.2 % SUSP Place 1 drop into both eyes 2 (two) times daily.    Marland Kitchen aspirin 81 MG chewable tablet Chew 81 mg by mouth every other day.    . Calcium Carbonate-Vitamin D (CALCIUM + D) 600-200 MG-UNIT TABS Take 1  tablet by mouth daily.    . Carboxymethylcellul-Glycerin (OPTIVE SENSITIVE) 0.5-0.9 % SOLN Apply 1 drop to eye 3 (three) times daily. 45 mL 3  . cetirizine (ZYRTEC) 10 MG tablet TAKE 1 TABLET DAILY 90 tablet 2  . chlorhexidine (PERIDEX) 0.12 % solution Use as directed 15 mLs in the mouth or throat 2 (two) times daily as needed. 480 mL 3  . cyanocobalamin 2000 MCG tablet Take 2,000 mcg by mouth daily.    . fluticasone (FLONASE) 50 MCG/ACT nasal spray USE 2 SPRAYS IN EACH NOSTRIL ONCE DAILY 16 g 3  . losartan (COZAAR) 50 MG tablet TAKE 1 TABLET DAILY 90 tablet 3  . Melatonin 10 MG TABS Take 1 tablet by mouth at bedtime.    . Multiple Vitamin (MULITIVITAMIN WITH MINERALS) TABS Take 1 tablet by mouth daily.    . pantoprazole (PROTONIX) 40 MG tablet TAKE 1 TABLET DAILY 90 tablet 2  . PATADAY 0.2 % SOLN INSTILL 1 DROP IN BOTH EYES DAILY 3 Bottle 2  . PROAIR HFA 108 (90 BASE) MCG/ACT inhaler INHALE 2 PUFFS INTO THE LUNGS EVERY 6 HOURS AS NEEDED FOR SHORTNESS OF BREATH 3 each 3  . Propylene Glycol (SYSTANE BALANCE OP) Apply 1 drop to eye as needed. For dry eyes    .  ranitidine (ZANTAC) 150 MG tablet Take 1 tablet (150 mg total) by mouth 2 (two) times daily. 180 tablet 3  . Triamcinolone Acetonide (TRIAMCINOLONE 0.1 % CREAM : EUCERIN) CREA Apply 1 application topically 2 (two) times daily as needed. 1 each 2  . UCERIS 9 MG TB24 Take 1 tablet by mouth daily.     No current facility-administered medications on file prior to visit.   Allergies  Allergen Reactions  . Cymbalta [Duloxetine Hcl] Other (See Comments)    Makes me lethargic  . Lyrica [Pregabalin] Other (See Comments)    Makes me lethargic  . Ranitidine Diarrhea  . Celebrex [Celecoxib] Rash  . Naprosyn [Naproxen] Rash   Social History   Social History  . Marital Status: Married    Spouse Name: N/A  . Number of Children: 3  . Years of Education: N/A   Occupational History  . Not on file.   Social History Main Topics  . Smoking  status: Former Smoker    Quit date: 10/11/1977  . Smokeless tobacco: Never Used  . Alcohol Use: No  . Drug Use: Yes    Special: Oxycodone  . Sexual Activity: Not on file   Other Topics Concern  . Not on file   Social History Narrative   Lives at home with husband.      Review of Systems  All other systems reviewed and are negative.      Objective:   Physical Exam  Constitutional: She appears well-developed and well-nourished. No distress.  HENT:  Right Ear: External ear normal.  Left Ear: External ear normal.  Nose: Mucosal edema and rhinorrhea present. Right sinus exhibits no maxillary sinus tenderness and no frontal sinus tenderness. Left sinus exhibits no maxillary sinus tenderness and no frontal sinus tenderness.  Mouth/Throat: Oropharynx is clear and moist. No oropharyngeal exudate, posterior oropharyngeal edema or posterior oropharyngeal erythema.  Neck: Neck supple.  Cardiovascular: Normal rate, regular rhythm and normal heart sounds.   Pulmonary/Chest: Effort normal and breath sounds normal. No respiratory distress. She has no wheezes. She has no rales.  Lymphadenopathy:    She has no cervical adenopathy.  Skin: No rash noted. She is not diaphoretic.  Vitals reviewed.         Assessment & Plan:  Acute bronchitis due to other specified organisms - Plan: azithromycin (ZITHROMAX) 250 MG tablet  Patient's exam is unremarkable. However her story is very concerning for a secondary bacterial infection given the worsening fever, worsening cough productive of yellow mucus. Given her age, I will cover the patient for bacterial bronchitis with Zithromax 500 mg by mouth daily one, 250 mg by mouth daily stool 35. I recommended Mucinex. Recheck immediately if symptoms worsen.

## 2015-10-08 ENCOUNTER — Telehealth: Payer: Self-pay | Admitting: Family Medicine

## 2015-10-08 NOTE — Telephone Encounter (Signed)
Pt called and states that she is better but still having that cough - she is using Tessalon and her Proair inhaler just in the morning. She states that her daughter had the same kind of cough and her MD put her on Breo and she was wondering if you think it may help her as well??? She is also still c/o right side hip pain when lying on that side for a long period of time really puts her in a lot of pain (she did not note "what is long period of time"). She did state she had mentioned it to you before but you were addressing other issues as well. Please advise.

## 2015-10-09 DIAGNOSIS — H04123 Dry eye syndrome of bilateral lacrimal glands: Secondary | ICD-10-CM | POA: Diagnosis not present

## 2015-10-09 NOTE — Telephone Encounter (Signed)
Pain in hip sounds like bursitis (could try cortisone shot).  If cough is not better I'd like to see her back and we could discuss breo as well.

## 2015-10-09 NOTE — Telephone Encounter (Signed)
appt made pt aware

## 2015-10-10 ENCOUNTER — Ambulatory Visit (INDEPENDENT_AMBULATORY_CARE_PROVIDER_SITE_OTHER): Payer: Medicare Other | Admitting: Family Medicine

## 2015-10-10 ENCOUNTER — Encounter: Payer: Self-pay | Admitting: Family Medicine

## 2015-10-10 VITALS — BP 156/60 | HR 78 | Temp 98.1°F | Resp 18 | Ht 59.5 in | Wt 158.0 lb

## 2015-10-10 DIAGNOSIS — J4531 Mild persistent asthma with (acute) exacerbation: Secondary | ICD-10-CM

## 2015-10-10 MED ORDER — BENZONATATE 100 MG PO CAPS: 100.0000 mg | ORAL_CAPSULE | Freq: Three times a day (TID) | ORAL | Status: DC | PRN

## 2015-10-10 MED ORDER — PREDNISONE 20 MG PO TABS: ORAL_TABLET | ORAL | Status: DC

## 2015-10-10 NOTE — Progress Notes (Signed)
Subjective:    Patient ID: Brianna Jacobs, female    DOB: 10-26-1929, 81 y.o.   MRN: [id number]  HPI  10/02/15 Symptoms began almost 7 days ago with a viral upper respiratory infection with rhinorrhea. However over the last 48 hours, the patient has developed fevers to 101, worsening cough, shortness of breath, and wheezing. She also reports dull headache. She denies any pain in her sinuses. She continues to have rhinorrhea. She denies any sore throat. However the cough and chest congestion has gotten drastically worse. On examination today there are no wheezes crackles Rales. There is no respiratory distress. Patient demonstrates normal air movement.  At that time, my plan was: Patient's exam is unremarkable. However her story is very concerning for a secondary bacterial infection given the worsening fever, worsening cough productive of yellow mucus. Given her age, I will cover the patient for bacterial bronchitis with Zithromax 500 mg by mouth daily one, 250 mg by mouth daily stool 35. I recommended Mucinex. Recheck immediately if symptoms worsen.  10/10/15 Breathing has worsened.  She is now audibly wheezing.  She has frequent, uncontrollable coughing spells and worsening sob.  She is using albuterol 3-4 times a day with some relief. Past Medical History  Diagnosis Date  . Asthma     occassional usage of inhalers  . Depression   . ADD (attention deficit disorder)   . Hypertension   . Cancer [facility])     colon cancer, 16 yrs ago  . Bowel obstruction ([place])   . Neuromuscular disorder (HCC)     neuropathy  . Arthritis   . Spinal stenosis   . Scoliosis   . Cataract   . GERD (gastroesophageal reflux disease)   . Osteoporosis    Past Surgical History  Procedure Laterality Date  . Sigmoid resection / rectopexy    . Colonoscopy      2 yrs ago  . Eye surgery      cataracts bilat  . Joint replacement      bilat shoulders  . Fracture surgery      thoracic spine  . Abdominal  hysterectomy  1972    partial  . Spinal cord stimulator insertion  11/18/2011    Procedure: LUMBAR SPINAL CORD STIMULATOR INSERTION;  Surgeon: Dahlia Bailiff, MD;  Location: [place];  Service: Orthopedics;  Laterality: N/A;  Thoracic 9 laminiotomy, SPINAL CORD STIMULATOR PLACEMENT thoracic 9- Thoracic 7   Current Outpatient Prescriptions on File Prior to Visit  Medication Sig Dispense Refill  . aspirin 81 MG chewable tablet Chew 81 mg by mouth every other day.    . Calcium Carbonate-Vitamin D (CALCIUM + D) 600-200 MG-UNIT TABS Take 1 tablet by mouth daily.    . Carboxymethylcellul-Glycerin (OPTIVE SENSITIVE) 0.5-0.9 % SOLN Apply 1 drop to eye 3 (three) times daily. 45 mL 3  . cetirizine (ZYRTEC) 10 MG tablet TAKE 1 TABLET DAILY 90 tablet 2  . chlorhexidine (PERIDEX) 0.12 % solution Use as directed 15 mLs in the mouth or throat 2 (two) times daily as needed. 480 mL 3  . cyanocobalamin 2000 MCG tablet Take 2,000 mcg by mouth daily.    . fluticasone (FLONASE) 50 MCG/ACT nasal spray USE 2 SPRAYS IN EACH NOSTRIL ONCE DAILY 16 g 3  . losartan (COZAAR) 50 MG tablet TAKE 1 TABLET DAILY 90 tablet 3  . Melatonin 10 MG TABS Take 1 tablet by mouth at bedtime.    . Multiple Vitamin (MULITIVITAMIN WITH MINERALS) TABS Take 1 tablet  by mouth daily.    . pantoprazole (PROTONIX) 40 MG tablet TAKE 1 TABLET DAILY 90 tablet 2  . PROAIR HFA 108 (90 BASE) MCG/ACT inhaler INHALE 2 PUFFS INTO THE LUNGS EVERY 6 HOURS AS NEEDED FOR SHORTNESS OF BREATH 3 each 3  . Propylene Glycol (SYSTANE BALANCE OP) Apply 1 drop to eye as needed. For dry eyes    . ranitidine (ZANTAC) 150 MG tablet Take 1 tablet (150 mg total) by mouth 2 (two) times daily. 180 tablet 3  . Triamcinolone Acetonide (TRIAMCINOLONE 0.1 % CREAM : EUCERIN) CREA Apply 1 application topically 2 (two) times daily as needed. 1 each 2  . UCERIS 9 MG TB24 Take 1 tablet by mouth daily.     No current facility-administered medications on file prior to visit.    Allergies  Allergen Reactions  . Cymbalta [Duloxetine Hcl] Other (See Comments)    Makes me lethargic  . Lyrica [Pregabalin] Other (See Comments)    Makes me lethargic  . Ranitidine Diarrhea  . Celebrex [Celecoxib] Rash  . Naprosyn [Naproxen] Rash   Social History   Social History  . Marital Status: Married    Spouse Name: N/A  . Number of Children: 3  . Years of Education: N/A   Occupational History  . Not on file.   Social History Main Topics  . Smoking status: Former Smoker    Quit date: 10/11/1977  . Smokeless tobacco: Never Used  . Alcohol Use: No  . Drug Use: Yes    Special: Oxycodone  . Sexual Activity: Not on file   Other Topics Concern  . Not on file   Social History Narrative   Lives at home with husband.      Review of Systems  All other systems reviewed and are negative.      Objective:   Physical Exam  Constitutional: She appears well-developed and well-nourished. No distress.  HENT:  Right Ear: External ear normal.  Left Ear: External ear normal.  Nose: Right sinus exhibits no maxillary sinus tenderness and no frontal sinus tenderness. Left sinus exhibits no maxillary sinus tenderness and no frontal sinus tenderness.  Mouth/Throat: Oropharynx is clear and moist. No oropharyngeal exudate, posterior oropharyngeal edema or posterior oropharyngeal erythema.  Neck: Neck supple.  Cardiovascular: Normal rate, regular rhythm and normal heart sounds.   Pulmonary/Chest: Effort normal. No respiratory distress. She has wheezes. She has no rales.  Lymphadenopathy:    She has no cervical adenopathy.  Skin: No rash noted. She is not diaphoretic.  Vitals reviewed.         Assessment & Plan:  Asthma with acute exacerbation, mild persistent - Plan: predniSONE (DELTASONE) 20 MG tablet, benzonatate (TESSALON) 100 MG capsule  Has developed a secondary asthma exacerbation.  Begin prednisone taper pack and continue albuterol qid prn.  Add breo once daily  as a preventative (samples given).  Use tessalon 100 q 8 hrs prn cough.  Recheck in 1 week or sooner if worse.

## 2015-11-07 ENCOUNTER — Other Ambulatory Visit: Payer: Self-pay | Admitting: Family Medicine

## 2015-11-07 ENCOUNTER — Ambulatory Visit (INDEPENDENT_AMBULATORY_CARE_PROVIDER_SITE_OTHER): Payer: Medicare Other | Admitting: Family Medicine

## 2015-11-07 ENCOUNTER — Encounter: Payer: Self-pay | Admitting: Family Medicine

## 2015-11-07 VITALS — BP 122/70 | HR 80 | Temp 98.3°F | Resp 16 | Ht 59.5 in | Wt 158.0 lb

## 2015-11-07 DIAGNOSIS — R1310 Dysphagia, unspecified: Secondary | ICD-10-CM

## 2015-11-07 DIAGNOSIS — G629 Polyneuropathy, unspecified: Secondary | ICD-10-CM | POA: Diagnosis not present

## 2015-11-07 DIAGNOSIS — M48061 Spinal stenosis, lumbar region without neurogenic claudication: Secondary | ICD-10-CM

## 2015-11-07 DIAGNOSIS — M4806 Spinal stenosis, lumbar region: Secondary | ICD-10-CM

## 2015-11-07 MED ORDER — SUCRALFATE 1 G PO TABS: 1.0000 g | ORAL_TABLET | Freq: Three times a day (TID) | ORAL | Status: DC

## 2015-11-07 MED ORDER — AMOXICILLIN 500 MG PO CAPS: ORAL_CAPSULE | ORAL | Status: DC

## 2015-11-07 NOTE — Progress Notes (Signed)
Subjective:    Patient ID: Brianna Jacobs, female    DOB: 1930-03-30, 82 y.o.   MRN: [id number]  HPI Patient has a known past medical history of lumbar spinal stenosis. She is seen multiple orthopedics and pain clinics. Approximately 2 years ago she discontinued all pain medication because she was concerned about becoming addicted to the medication. Since that time she's developed worsening pain in her back in both legs and in her hips. She also reports burning stinging paresthesias radiating down both legs into her feet. The pain dramatically lowers her quality of life. It keeps her from cooking and shopping and from performing the activities around her home to give her meaning. This is lead to depression. She reports trouble sleeping. She reports anhedonia. She denies any suicidal ideation she definitely believes that the pain is a large contributor factor. She also reports dysphagia to solids and liquids. She denies any food sticking or becoming lodged but she does report pain with swallowing. She is currently taking pantoprazole as well as Zantac. She has a history of an esophageal stricture. She is concerned that this may be returning Past Medical History  Diagnosis Date  . Asthma     occassional usage of inhalers  . Depression   . ADD (attention deficit disorder)   . Hypertension   . Cancer [facility])     colon cancer, 16 yrs ago  . Bowel obstruction ([place])   . Neuromuscular disorder (HCC)     neuropathy  . Arthritis   . Spinal stenosis   . Scoliosis   . Cataract   . GERD (gastroesophageal reflux disease)   . Osteoporosis    Past Surgical History  Procedure Laterality Date  . Sigmoid resection / rectopexy    . Colonoscopy      2 yrs ago  . Eye surgery      cataracts bilat  . Joint replacement      bilat shoulders  . Fracture surgery      thoracic spine  . Abdominal hysterectomy  1972    partial  . Spinal cord stimulator insertion  11/18/2011    Procedure: LUMBAR SPINAL CORD  STIMULATOR INSERTION;  Surgeon: Dahlia Bailiff, MD;  Location: [place];  Service: Orthopedics;  Laterality: N/A;  Thoracic 9 laminiotomy, SPINAL CORD STIMULATOR PLACEMENT thoracic 9- Thoracic 7   Current Outpatient Prescriptions on File Prior to Visit  Medication Sig Dispense Refill  . aspirin 81 MG chewable tablet Chew 81 mg by mouth every other day.    . benzonatate (TESSALON) 100 MG capsule Take 1 capsule (100 mg total) by mouth 3 (three) times daily as needed for cough. 20 capsule 0  . Calcium Carbonate-Vitamin D (CALCIUM + D) 600-200 MG-UNIT TABS Take 1 tablet by mouth daily.    . Carboxymethylcellul-Glycerin (OPTIVE SENSITIVE) 0.5-0.9 % SOLN Apply 1 drop to eye 3 (three) times daily. 45 mL 3  . cetirizine (ZYRTEC) 10 MG tablet TAKE 1 TABLET DAILY 90 tablet 2  . chlorhexidine (PERIDEX) 0.12 % solution Use as directed 15 mLs in the mouth or throat 2 (two) times daily as needed. 480 mL 3  . cyanocobalamin 2000 MCG tablet Take 2,000 mcg by mouth daily.    . Fish Oil OIL by Does not apply route. 4 tabs po qd    . fluticasone (FLONASE) 50 MCG/ACT nasal spray USE 2 SPRAYS IN EACH NOSTRIL ONCE DAILY 16 g 3  . losartan (COZAAR) 50 MG tablet TAKE 1 TABLET DAILY 90 tablet  3  . Melatonin 10 MG TABS Take 1 tablet by mouth at bedtime.    . Multiple Vitamin (MULITIVITAMIN WITH MINERALS) TABS Take 1 tablet by mouth daily.    . pantoprazole (PROTONIX) 40 MG tablet TAKE 1 TABLET DAILY 90 tablet 2  . predniSONE (DELTASONE) 20 MG tablet 3 tabs poqday 1-2, 2 tabs poqday 3-4, 1 tab poqday 5-6 12 tablet 0  . PROAIR HFA 108 (90 BASE) MCG/ACT inhaler INHALE 2 PUFFS INTO THE LUNGS EVERY 6 HOURS AS NEEDED FOR SHORTNESS OF BREATH 3 each 3  . Propylene Glycol (SYSTANE BALANCE OP) Apply 1 drop to eye as needed. For dry eyes    . ranitidine (ZANTAC) 150 MG tablet Take 1 tablet (150 mg total) by mouth 2 (two) times daily. 180 tablet 3  . Triamcinolone Acetonide (TRIAMCINOLONE 0.1 % CREAM : EUCERIN) CREA Apply 1  application topically 2 (two) times daily as needed. 1 each 2  . triamcinolone cream (KENALOG) 0.1 % apply topically twice a day if needed  0  . UCERIS 9 MG TB24 Take 1 tablet by mouth daily.     No current facility-administered medications on file prior to visit.   Allergies  Allergen Reactions  . Cymbalta [Duloxetine Hcl] Other (See Comments)    Makes me lethargic  . Lyrica [Pregabalin] Other (See Comments)    Makes me lethargic  . Ranitidine Diarrhea  . Celebrex [Celecoxib] Rash  . Naprosyn [Naproxen] Rash   Social History   Social History  . Marital Status: Married    Spouse Name: N/A  . Number of Children: 3  . Years of Education: N/A   Occupational History  . Not on file.   Social History Main Topics  . Smoking status: Former Smoker    Quit date: 10/11/1977  . Smokeless tobacco: Never Used  . Alcohol Use: No  . Drug Use: Yes    Special: Oxycodone  . Sexual Activity: Not on file   Other Topics Concern  . Not on file   Social History Narrative   Lives at home with husband.      Review of Systems  All other systems reviewed and are negative.      Objective:   Physical Exam  Cardiovascular: Normal rate, regular rhythm and normal heart sounds.   No murmur heard. Pulmonary/Chest: Effort normal and breath sounds normal. No respiratory distress. She has no wheezes. She has no rales.  Abdominal: Soft. Bowel sounds are normal.  Musculoskeletal:       Lumbar back: She exhibits decreased range of motion, tenderness and bony tenderness.  Vitals reviewed.         Assessment & Plan:  Dysphagia - Plan: sucralfate (CARAFATE) 1 g tablet  Neuropathy (HCC)  Spinal stenosis of lumbar region  I believe the majority of the patient's depression is due to her chronic pain and the fact that it has on her quality of life. Therefore I recommended that she resume oxycodone 10 mg every 6 hours as needed. Recheck in one month and if the depression is continuing to  persist, I would suggest starting the patient on Lexapro versus Prozac. I will also start the patient on sucralfate 1 g by mouth every before meals and at bedtime and if her dysphagia persists, I would recommend a GI consultation for an EGD.

## 2015-11-07 NOTE — Telephone Encounter (Signed)
Medication refilled per protocol. 

## 2015-11-10 ENCOUNTER — Telehealth: Payer: Self-pay | Admitting: Family Medicine

## 2015-11-10 MED ORDER — OXYCODONE-ACETAMINOPHEN 10-325 MG PO TABS: ORAL_TABLET | ORAL | Status: DC

## 2015-11-10 NOTE — Telephone Encounter (Signed)
I though she was on oxycodone 10 mg tabs, 1/2-1 every 6 hrs as needed for pain (90).

## 2015-11-10 NOTE — Telephone Encounter (Signed)
Patient states she was here last week and was told she could get a prescription for hydrocodone.

## 2015-11-10 NOTE — Telephone Encounter (Signed)
RX printed, left up front and patient picked up

## 2015-11-13 ENCOUNTER — Other Ambulatory Visit: Payer: Self-pay | Admitting: Family Medicine

## 2015-11-13 NOTE — Telephone Encounter (Signed)
Patient would like [facility] for the Breio 200/25 mg that Dr. Dennard Schaumann gave her samples at last office visit.  She states that it is working very well.  CB#[id number]

## 2015-11-14 ENCOUNTER — Other Ambulatory Visit: Payer: Self-pay | Admitting: Family Medicine

## 2015-11-14 MED ORDER — FLUTICASONE FUROATE-VILANTEROL 100-25 MCG/INH IN AEPB: 1.0000 | INHALATION_SPRAY | Freq: Every day | RESPIRATORY_TRACT | Status: DC

## 2015-11-14 NOTE — Telephone Encounter (Signed)
Medication refilled per protocol. 

## 2015-11-14 NOTE — Telephone Encounter (Signed)
Refill appropriate and filled per protocol. 

## 2015-11-19 ENCOUNTER — Telehealth: Payer: Self-pay | Admitting: Family Medicine

## 2015-11-19 NOTE — Telephone Encounter (Signed)
Received form from ins about the pt's Breo as it is not on her formulary. They will cover Advair - OK to send in RX for Advair or is there a reason she can not take the Advair in which case I can do PA for Breo!?

## 2015-11-20 ENCOUNTER — Encounter: Payer: Self-pay | Admitting: Family Medicine

## 2015-11-20 NOTE — Telephone Encounter (Signed)
Tried to call no answer and no vm - send [employer].

## 2015-11-20 NOTE — Telephone Encounter (Signed)
Ok with advair 250/50 inh bid.

## 2015-11-25 NOTE — Telephone Encounter (Signed)
PA was approved through 10/10/2098 Pt aware via mychart

## 2015-11-25 NOTE — Telephone Encounter (Signed)
Pt was contacted via mychart - please see those notes

## 2015-12-11 ENCOUNTER — Other Ambulatory Visit: Payer: Self-pay | Admitting: Family Medicine

## 2015-12-11 DIAGNOSIS — R413 Other amnesia: Secondary | ICD-10-CM

## 2015-12-12 DIAGNOSIS — H02135 Senile ectropion of left lower eyelid: Secondary | ICD-10-CM | POA: Diagnosis not present

## 2015-12-12 DIAGNOSIS — H16223 Keratoconjunctivitis sicca, not specified as Sjogren's, bilateral: Secondary | ICD-10-CM | POA: Diagnosis not present

## 2015-12-12 DIAGNOSIS — H04123 Dry eye syndrome of bilateral lacrimal glands: Secondary | ICD-10-CM | POA: Diagnosis not present

## 2015-12-12 DIAGNOSIS — H02132 Senile ectropion of right lower eyelid: Secondary | ICD-10-CM | POA: Diagnosis not present

## 2015-12-16 ENCOUNTER — Encounter: Payer: Self-pay | Admitting: *Deleted

## 2015-12-16 DIAGNOSIS — Z86010 Personal history of colonic polyps: Secondary | ICD-10-CM | POA: Diagnosis not present

## 2015-12-16 DIAGNOSIS — K5900 Constipation, unspecified: Secondary | ICD-10-CM | POA: Diagnosis not present

## 2015-12-16 DIAGNOSIS — R140 Abdominal distension (gaseous): Secondary | ICD-10-CM | POA: Diagnosis not present

## 2015-12-16 DIAGNOSIS — K219 Gastro-esophageal reflux disease without esophagitis: Secondary | ICD-10-CM | POA: Diagnosis not present

## 2015-12-17 DIAGNOSIS — H04123 Dry eye syndrome of bilateral lacrimal glands: Secondary | ICD-10-CM | POA: Diagnosis not present

## 2015-12-24 ENCOUNTER — Other Ambulatory Visit: Payer: Self-pay | Admitting: Family Medicine

## 2015-12-24 NOTE — Telephone Encounter (Signed)
Medication refilled per protocol. 

## 2015-12-25 ENCOUNTER — Other Ambulatory Visit: Payer: Self-pay | Admitting: Family Medicine

## 2015-12-25 DIAGNOSIS — R1310 Dysphagia, unspecified: Secondary | ICD-10-CM

## 2015-12-25 MED ORDER — SUCRALFATE 1 G PO TABS: 1.0000 g | ORAL_TABLET | Freq: Three times a day (TID) | ORAL | Status: DC

## 2015-12-26 ENCOUNTER — Other Ambulatory Visit: Payer: Self-pay | Admitting: Family Medicine

## 2015-12-26 DIAGNOSIS — R1310 Dysphagia, unspecified: Secondary | ICD-10-CM

## 2015-12-26 MED ORDER — SUCRALFATE 1 G PO TABS: 1.0000 g | ORAL_TABLET | Freq: Three times a day (TID) | ORAL | Status: DC

## 2016-01-02 ENCOUNTER — Encounter: Payer: Self-pay | Admitting: Family Medicine

## 2016-01-02 ENCOUNTER — Other Ambulatory Visit: Payer: Self-pay | Admitting: *Deleted

## 2016-01-02 MED ORDER — OXYCODONE-ACETAMINOPHEN 10-325 MG PO TABS: ORAL_TABLET | ORAL | Status: DC

## 2016-01-02 NOTE — Telephone Encounter (Signed)
Script printed, provider signed, pt pick up in [facility]

## 2016-01-02 NOTE — Telephone Encounter (Signed)
ok 

## 2016-01-02 NOTE — Telephone Encounter (Signed)
Pt calling needs refill on oxyCODONE-acetaminophen (PERCOCET) 10-325 MG tablet   Last rf: 11/10/15  Last ov 11/07/15  ?ok to refill

## 2016-01-05 ENCOUNTER — Other Ambulatory Visit: Payer: Self-pay | Admitting: Family Medicine

## 2016-01-05 ENCOUNTER — Encounter: Payer: Self-pay | Admitting: Family Medicine

## 2016-01-05 ENCOUNTER — Ambulatory Visit (INDEPENDENT_AMBULATORY_CARE_PROVIDER_SITE_OTHER): Payer: Medicare Other | Admitting: Family Medicine

## 2016-01-05 ENCOUNTER — Ambulatory Visit
Admission: RE | Admit: 2016-01-05 | Discharge: 2016-01-05 | Disposition: A | Discharge status: Home / Self Care | Payer: Medicare Other | Source: Ambulatory Visit | Attending: Family Medicine | Admitting: Family Medicine

## 2016-01-05 VITALS — BP 150/70 | HR 80 | Temp 98.1°F | Resp 18 | Ht 59.0 in | Wt 160.0 lb

## 2016-01-05 DIAGNOSIS — M4806 Spinal stenosis, lumbar region: Secondary | ICD-10-CM

## 2016-01-05 DIAGNOSIS — M51369 Other intervertebral disc degeneration, lumbar region without mention of lumbar back pain or lower extremity pain: Secondary | ICD-10-CM

## 2016-01-05 DIAGNOSIS — M549 Dorsalgia, unspecified: Secondary | ICD-10-CM

## 2016-01-05 DIAGNOSIS — G629 Polyneuropathy, unspecified: Secondary | ICD-10-CM | POA: Diagnosis not present

## 2016-01-05 DIAGNOSIS — S22080A Wedge compression fracture of T11-T12 vertebra, initial encounter for closed fracture: Secondary | ICD-10-CM | POA: Diagnosis not present

## 2016-01-05 DIAGNOSIS — M5136 Other intervertebral disc degeneration, lumbar region: Secondary | ICD-10-CM

## 2016-01-05 DIAGNOSIS — M48061 Spinal stenosis, lumbar region without neurogenic claudication: Secondary | ICD-10-CM

## 2016-01-05 MED ORDER — PREDNISONE 20 MG PO TABS: ORAL_TABLET | ORAL | Status: DC

## 2016-01-05 NOTE — Progress Notes (Signed)
Subjective:    Patient ID: Brianna Jacobs, female    DOB: 10-Sep-1930, 82 y.o.   MRN: [id number]  HPI  Had MRI 2011 of lumbar spine:  T11-T12: Retropulsion produces mild central stenosis. Flattening the ventral aspect of the thoracic cord. T12-L1: Desiccated disc with broad-based posterior protrusion. Mild bilateral facet hypertrophy and ligamentum flavum redundancy without significant stenosis. L1-L2: Disc desiccation with broad-based posterior disc protrusion. Mild central stenosis. Mild bilateral facet hypertrophy. Foramina and lateral recesses patent. L2-L3: Severely degenerated and desiccated disc with degenerative endplate changes. Right greater than left facet hypertrophy. Central canal and lateral recesses patent. Moderate right foraminal stenosis associated with scoliosis and osteophytes. Right lateral disc protrusion and osteophyte could affect the exiting right L2 nerve root. Lateral protrusion and osteophyte are new compared to prior exam. L3-L4: Degenerated disc. Left eccentric broad-based posterior disc protrusion extending into the left neural foramen. Bilateral facet hypertrophy with moderate central stenosis. Left lateral recess stenosis. Moderate right foraminal stenosis with mild left foraminal stenosis. L4-L5: Severely degenerated and desiccated disc with endplate changes. Severe bilateral facet arthrosis and ligamentum flavum redundancy. Left foraminal disc protrusion and severe left foraminal stenosis with compression of the exiting left L4 nerve. Left greater than right bilateral subarticular lateral recess stenosis. Minimal right foraminal stenosis. Mild to moderate central stenosis. L5-S1: Grade 1 anterolisthesis with uncoverage of the disc and a broad-based protrusion. Bilateral lateral recess stenosis. Severe bilateral facet arthrosis with bilateral facet effusions. Mild central stenosis. Severe bilateral foraminal stenosis  with compression of both L5 nerve roots.  Patient has a long-standing history of lower back problems as evidenced in the MRI report from 2011 listed above. She also has a history of 2 separate vertebral fractures in the thoracic spine show normal x-ray of the thoracic spine in 2013. However this weekend, the patient stumbled and fell in a restaurant. Shortly thereafter, she developed severe sharp stabbing pain up and down the thoracic spine. The pain is primarily located in the paraspinal areas on either side of thoracic spine. She denies any new numbness or weakness in the right leg. However there is some new numbness and tingling located on the lateral aspect of the left thigh. She has chronic weakness in both legs and chronic neuropathy in both legs. There is no tenderness to palpation over the spinous processes of the thoracic spine or in the thoracic spine paraspinal muscles however she states the pain is most intense it has been in years. Past Medical History  Diagnosis Date  . Asthma     occassional usage of inhalers  . Depression   . ADD (attention deficit disorder)   . Hypertension   . Cancer [facility])     colon cancer, 16 yrs ago  . Bowel obstruction ([place])   . Neuromuscular disorder (HCC)     neuropathy  . Arthritis   . Spinal stenosis   . Scoliosis   . Cataract   . GERD (gastroesophageal reflux disease)   . Osteoporosis    Past Surgical History  Procedure Laterality Date  . Sigmoid resection / rectopexy    . Colonoscopy      2 yrs ago  . Eye surgery      cataracts bilat  . Joint replacement      bilat shoulders  . Fracture surgery      thoracic spine  . Abdominal hysterectomy  1972    partial  . Spinal cord stimulator insertion  11/18/2011    Procedure: LUMBAR SPINAL CORD STIMULATOR  INSERTION;  Surgeon: Dahlia Bailiff, MD;  Location: [place];  Service: Orthopedics;  Laterality: N/A;  Thoracic 9 laminiotomy, SPINAL CORD STIMULATOR PLACEMENT thoracic 9- Thoracic 7   Current  Outpatient Prescriptions on File Prior to Visit  Medication Sig Dispense Refill  . amoxicillin (AMOXIL) 500 MG capsule Take 500 mg by mouth 3 (three) times daily. Take 4 capsules 1 hour prior to dental appt    . aspirin 81 MG chewable tablet Chew 81 mg by mouth every other day.    . benzonatate (TESSALON) 100 MG capsule Take 1 capsule (100 mg total) by mouth 3 (three) times daily as needed for cough. 20 capsule 0  . Calcium Carbonate-Vitamin D (CALCIUM + D) 600-200 MG-UNIT TABS Take 1 tablet by mouth daily.    . Carboxymethylcellul-Glycerin (OPTIVE SENSITIVE) 0.5-0.9 % SOLN Apply 1 drop to eye 3 (three) times daily. 45 mL 3  . cetirizine (ZYRTEC) 10 MG tablet TAKE 1 TABLET DAILY 90 tablet 2  . cyanocobalamin 2000 MCG tablet Take 2,000 mcg by mouth daily.    . Fish Oil OIL by Does not apply route. 4 tabs po qd    . Fluticasone Furoate-Vilanterol (BREO ELLIPTA) 100-25 MCG/INH AEPB Inhale 1 puff into the lungs daily. 90 each 1  . losartan (COZAAR) 50 MG tablet TAKE 1 TABLET DAILY 90 tablet 2  . Melatonin 10 MG TABS Take 1 tablet by mouth at bedtime.    Marland Kitchen oxyCODONE-acetaminophen (PERCOCET) 10-325 MG tablet .5 - 1 tab po Q 6hours 90 tablet 0  . pantoprazole (PROTONIX) 40 MG tablet TAKE 1 TABLET DAILY 90 tablet 2  . PROAIR HFA 108 (90 BASE) MCG/ACT inhaler INHALE 2 PUFFS INTO THE LUNGS EVERY 6 HOURS AS NEEDED FOR SHORTNESS OF BREATH 3 each 3  . Propylene Glycol (SYSTANE BALANCE OP) Apply 1 drop to eye as needed. For dry eyes    . ranitidine (ZANTAC) 150 MG tablet TAKE 1 TABLET TWICE A DAY 180 tablet 3  . sucralfate (CARAFATE) 1 g tablet Take 1 tablet (1 g total) by mouth 4 (four) times daily -  with meals and at bedtime. 450 tablet 3  . Triamcinolone Acetonide (TRIAMCINOLONE 0.1 % CREAM : EUCERIN) CREA Apply 1 application topically 2 (two) times daily as needed. 1 each 2  . UCERIS 9 MG TB24 Take 1 tablet by mouth daily.     No current facility-administered medications on file prior to visit.    Allergies  Allergen Reactions  . Cymbalta [Duloxetine Hcl] Other (See Comments)    Makes me lethargic  . Lyrica [Pregabalin] Other (See Comments)    Makes me lethargic  . Ranitidine Diarrhea  . Celebrex [Celecoxib] Rash  . Naprosyn [Naproxen] Rash   Social History   Social History  . Marital Status: Married    Spouse Name: N/A  . Number of Children: 3  . Years of Education: N/A   Occupational History  . Not on file.   Social History Main Topics  . Smoking status: Former Smoker    Quit date: 10/11/1977  . Smokeless tobacco: Never Used  . Alcohol Use: No  . Drug Use: Yes    Special: Oxycodone  . Sexual Activity: Not on file   Other Topics Concern  . Not on file   Social History Narrative   Lives at home with husband.     Review of Systems  All other systems reviewed and are negative.      Objective:   Physical Exam  Constitutional:  She appears well-developed and well-nourished.  Cardiovascular: Normal rate, regular rhythm and normal heart sounds.   No murmur heard. Pulmonary/Chest: Effort normal and breath sounds normal. No respiratory distress. She has no wheezes. She has no rales.  Abdominal: Soft. Bowel sounds are normal.  Vitals reviewed.         Assessment & Plan:  Spinal stenosis of lumbar region  Neuropathy (HCC)  DDD (degenerative disc disease), lumbar  Mid-back pain, acute - Plan: DG Thoracic Spine W/Swimmers  Differential diagnosis includes herniated disc in the thoracic spine after a fall versus vertebral fracture in the thoracic spine after a fall. I like the patient to go immediately for an x-ray of her thoracic spine. If there is a vertebral fracture, we can discuss possible kyphoplasty versus pain management. However if there is no evidence of a new vertebral fracture, I will treat the patient empirically as possibly having a herniated disc with a prednisone taper pack. I will await the results of the x-ray of the thoracic spine.

## 2016-01-07 ENCOUNTER — Encounter: Payer: Self-pay | Admitting: Neurology

## 2016-01-07 ENCOUNTER — Other Ambulatory Visit (INDEPENDENT_AMBULATORY_CARE_PROVIDER_SITE_OTHER): Payer: Medicare Other

## 2016-01-07 ENCOUNTER — Ambulatory Visit (INDEPENDENT_AMBULATORY_CARE_PROVIDER_SITE_OTHER): Payer: Medicare Other | Admitting: Neurology

## 2016-01-07 VITALS — HR 78 | Ht <= 58 in | Wt 161.0 lb

## 2016-01-07 DIAGNOSIS — R413 Other amnesia: Secondary | ICD-10-CM

## 2016-01-07 LAB — VITAMIN B12: Vitamin B-12: 763 pg/mL (ref 211–911)

## 2016-01-07 LAB — TSH: TSH: 1.55 u[IU]/mL (ref 0.35–4.50)

## 2016-01-07 NOTE — Progress Notes (Signed)
NEUROLOGY CONSULTATION NOTE  Brianna Jacobs MRN: [id number] DOB: 18-Jun-1930  Referring provider: Dr. Dennard Schaumann Primary care provider: Dr, Dennard Schaumann  Reason for consult:  Memory loss  HISTORY OF PRESENT ILLNESS: Brianna Jacobs is an 82 year old right-handed female with chronic pain secondary to lumbar stenosis and ADHD who presents for memory loss.  History obtained by patient and her husband.  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  She had history of ADHD since she was a child.  She would have variable grades from one exam to another (from Fs to As).  She says she was always "not good with names" but she always remembers faces.  About 3 to 5 years ago, she began to become concerned about her memory.  She would forget content of book chapters she just read, such as character's names.  She will have difficulty remembering names of people she just met.  She has many friends who have Alzheimer's disease and became concerned.  She often goes on the internet to read about Alzheimer's, which causes increased anxiety.  She no longer drives due to the chronic pain and opiates that she takes for it, however she is not disoriented when she is riding around town.  She still cooks and denies forgetting old recipes.  She performs all of her ADLs without difficulty.  She enjoys reading and playing puzzles and word searches.  About 3 years ago, she was told that she may have early dementia.  She denies any progression of symptoms over the past 3 years.  She denies family history of dementia.  She denies personal history of head injury or stroke.  PAST MEDICAL HISTORY: Past Medical History  Diagnosis Date  . Asthma     occassional usage of inhalers  . Depression   . ADD (attention deficit disorder)   . Hypertension   . Cancer [facility])     colon cancer, 16 yrs ago  . Bowel obstruction ([place])   . Neuromuscular disorder (HCC)     neuropathy  . Arthritis   . Spinal stenosis   .  Scoliosis   . Cataract   . GERD (gastroesophageal reflux disease)   . Osteoporosis     PAST SURGICAL HISTORY: Past Surgical History  Procedure Laterality Date  . Sigmoid resection / rectopexy    . Colonoscopy      2 yrs ago  . Eye surgery      cataracts bilat  . Joint replacement      bilat shoulders  . Fracture surgery      thoracic spine  . Abdominal hysterectomy  1972    partial  . Spinal cord stimulator insertion  11/18/2011    Procedure: LUMBAR SPINAL CORD STIMULATOR INSERTION;  Surgeon: Dahlia Bailiff, MD;  Location: [place];  Service: Orthopedics;  Laterality: N/A;  Thoracic 9 laminiotomy, SPINAL CORD STIMULATOR PLACEMENT thoracic 9- Thoracic 7    MEDICATIONS: Current Outpatient Prescriptions on File Prior to Visit  Medication Sig Dispense Refill  . amoxicillin (AMOXIL) 500 MG capsule Take 500 mg by mouth 3 (three) times daily. Take 4 capsules 1 hour prior to dental appt    . aspirin 81 MG chewable tablet Chew 81 mg by mouth every other day.    . benzonatate (TESSALON) 100 MG capsule Take 1 capsule (100 mg total) by mouth 3 (three) times daily as needed for cough. 20 capsule 0  . Calcium Carbonate-Vitamin D (CALCIUM + D) 600-200 MG-UNIT TABS Take  1 tablet by mouth daily.    . cetirizine (ZYRTEC) 10 MG tablet TAKE 1 TABLET DAILY 90 tablet 2  . cyanocobalamin 2000 MCG tablet Take 2,000 mcg by mouth daily.    . Fish Oil OIL by Does not apply route. 4 tabs po qd    . Fluticasone Furoate-Vilanterol (BREO ELLIPTA) 100-25 MCG/INH AEPB Inhale 1 puff into the lungs daily. 90 each 1  . losartan (COZAAR) 50 MG tablet TAKE 1 TABLET DAILY 90 tablet 2  . Melatonin 10 MG TABS Take 1 tablet by mouth at bedtime.    Marland Kitchen oxyCODONE-acetaminophen (PERCOCET) 10-325 MG tablet .5 - 1 tab po Q 6hours 90 tablet 0  . pantoprazole (PROTONIX) 40 MG tablet TAKE 1 TABLET DAILY 90 tablet 2  . predniSONE (DELTASONE) 20 MG tablet 3 tabs po qd x 2 days, 2 tabs po qd x 2 days, 1 tab po qd x 2 days 12 tablet 0    . Propylene Glycol (SYSTANE BALANCE OP) Apply 1 drop to eye as needed. For dry eyes    . ranitidine (ZANTAC) 150 MG tablet TAKE 1 TABLET TWICE A DAY 180 tablet 3  . sucralfate (CARAFATE) 1 g tablet Take 1 tablet (1 g total) by mouth 4 (four) times daily -  with meals and at bedtime. 450 tablet 3  . Triamcinolone Acetonide (TRIAMCINOLONE 0.1 % CREAM : EUCERIN) CREA Apply 1 application topically 2 (two) times daily as needed. 1 each 2  . UCERIS 9 MG TB24 Take 1 tablet by mouth daily.    Marland Kitchen XIIDRA 5 % SOLN      No current facility-administered medications on file prior to visit.    ALLERGIES: Allergies  Allergen Reactions  . Cymbalta [Duloxetine Hcl] Other (See Comments)    Makes me lethargic  . Lyrica [Pregabalin] Other (See Comments)    Makes me lethargic  . Ranitidine Diarrhea  . Celebrex [Celecoxib] Rash  . Naprosyn [Naproxen] Rash    FAMILY HISTORY: Family History  Problem Relation Age of Onset  . Breast cancer Sister   . Bladder Cancer Sister   . Colon cancer Mother   . Prostate cancer Brother   . Heart disease Brother     Pacemaker  . Heart attack Father     Died age 9    SOCIAL HISTORY: Social History   Social History  . Marital Status: Married    Spouse Name: N/A  . Number of Children: 3  . Years of Education: N/A   Occupational History  . Not on file.   Social History Main Topics  . Smoking status: Former Smoker    Quit date: 10/11/1977  . Smokeless tobacco: Never Used  . Alcohol Use: No  . Drug Use: Yes    Special: Oxycodone  . Sexual Activity: Not on file   Other Topics Concern  . Not on file   Social History Narrative   Lives at home with husband.     REVIEW OF SYSTEMS: Constitutional: No fevers, chills, or sweats, no generalized fatigue, change in appetite Eyes: No visual changes, double vision, eye pain Ear, nose and throat: No hearing loss, ear pain, nasal congestion, sore throat Cardiovascular: No chest pain,  palpitations Respiratory:  No shortness of breath at rest or with exertion, wheezes GastrointestinaI: No nausea, vomiting, diarrhea, abdominal pain, fecal incontinence Genitourinary:  No dysuria, urinary retention or frequency Musculoskeletal:  back pain Integumentary: No rash, pruritus, skin lesions Neurological: as above Psychiatric: No depression, insomnia, anxiety Endocrine: No palpitations,  fatigue, diaphoresis, mood swings, change in appetite, change in weight, increased thirst Hematologic/Lymphatic:  No anemia, purpura, petechiae. Allergic/Immunologic: no itchy/runny eyes, nasal congestion, recent allergic reactions, rashes  PHYSICAL EXAM: Filed Vitals:   01/07/16 1438  Pulse: 78   General: No acute distress.  Patient appears well-groomed.  Head:  Normocephalic/atraumatic Eyes:  fundi unremarkable, without vessel changes, exudates, hemorrhages or papilledema. Neck: supple, no paraspinal tenderness, full range of motion Back: No paraspinal tenderness Heart: regular rate and rhythm Lungs: Clear to auscultation bilaterally. Vascular: No carotid bruits. Neurological Exam: Mental status: alert and oriented to person, place, and time, delayed recall poor, remote memory intact, fund of knowledge intact, attention and concentration intact, speech fluent and not dysarthric, language intact. Montreal Cognitive Assessment  01/07/2016  Visuospatial/ Executive (0/5) 4  Naming (0/3) 3  Attention: Read list of digits (0/2) 2  Attention: Read list of letters (0/1) 1  Attention: Serial 7 subtraction starting at 100 (0/3) 3  Language: Repeat phrase (0/2) 2  Language : Fluency (0/1) 1  Abstraction (0/2) 2  Delayed Recall (0/5) 0  Orientation (0/6) 5  Total 23  Adjusted Score (based on education) 23   Cranial nerves: CN I: not tested CN II: pupils equal, round and reactive to light, visual fields intact, fundi unremarkable, without vessel changes, exudates, hemorrhages or  papilledema. CN III, IV, VI:  full range of motion, no nystagmus, no ptosis CN V: facial sensation intact CN VII: upper and lower face symmetric CN VIII: hearing intact CN IX, X: gag intact, uvula midline CN XI: sternocleidomastoid and trapezius muscles intact CN XII: tongue midline Bulk & Tone: normal, no fasciculations. Motor:  5/5 throughout Sensation:  Pinprick and vibration mildly reduced. Deep Tendon Reflexes:  1+ in upper extremities, absent lower extremities, toes downgoing.  Finger to nose testing:  Without dysmetria.  Heel to shin:  Without dysmetria.  Gait:  Mild woddle.  Able to turn but unable to tandem walk. Romberg negative.  IMPRESSION: Memory deficits.  Based on her description and observation, I would say her memory problems are within normal limits and may be complicated by anxiety or possible pain medications.  She did score a 23/30 on the MoCA, which is below the lower limit of normal (which is 26), but confounding factors such as anxiety may be contributing.  At the very least, she may have very mild cognitive impairment.  Formal neuropsychological testing may better sort this out, but I don't feel it is absolutely necessary as it would not change my recommendations.  Also, given stability of symptoms over past 3 years, and nothing too concerning found on my exam, I do not think we need to pursue CT of the head.  PLAN: 1.  Will check TSH and B12 2.  Mediterranean diet 3.  Social activities 4.  Routine exercise 5.  Mind teasers, jigsaw puzzles, word-searches, crossword puzzles 6.  Follow up in one year for re-evaluation (or sooner if symptoms worsen)  Thank you for allowing me to take part in the care of this patient.  Metta Clines, DO  CC:  Jenna Luo, MD

## 2016-01-07 NOTE — Patient Instructions (Signed)
You do not have Alzheimer's or other form of dementia.  It is likely normal aging complicated by underlying anxiety or medications, OR possibly mild cognitive impairment.  They way to differentiate this would be to get a neuropsychological test, however I don't think it is necessary at this time since it won't really change management. 1.  I would continue with puzzles (jigsaw, word searches, crossword) or playing cards (activities to stimulate your mind) 2.  Remain social and interact with people 3.  Start routine exercise.  Given your chronic back pain, [facility].  Mediterranean diet is very important and has been shown to help prevent onset of Alzheimer's dementia    Why follow it? Research shows. . Those who follow the Mediterranean diet have a reduced risk of heart disease  . The diet is associated with a reduced incidence of Parkinson's and Alzheimer's diseases . People following the diet may have longer life expectancies and lower rates of chronic diseases  . The Dietary Guidelines for Americans recommends the Mediterranean diet as an eating plan to promote health and prevent disease  What Is the Mediterranean Diet?  . Healthy eating plan based on typical foods and recipes of Mediterranean-style cooking . The diet is primarily a plant based diet; these foods should make up a majority of meals   Starches - Plant based foods should make up a majority of meals - They are an important sources of vitamins, minerals, energy, antioxidants, and fiber - Choose whole grains, foods high in fiber and minimally processed items  - Typical grain sources include wheat, oats, barley, corn, brown rice, bulgar, farro, millet, polenta, couscous  - Various types of beans include chickpeas, lentils, fava beans, black beans, white beans   Fruits  Veggies - Large quantities of antioxidant rich fruits & veggies; 6 or more servings  - Vegetables can be eaten raw or lightly  drizzled with oil and cooked  - Vegetables common to the traditional Mediterranean Diet include: artichokes, arugula, beets, broccoli, brussel sprouts, cabbage, carrots, celery, collard greens, cucumbers, eggplant, kale, leeks, lemons, lettuce, mushrooms, okra, onions, peas, peppers, potatoes, pumpkin, radishes, rutabaga, shallots, spinach, sweet potatoes, turnips, zucchini - Fruits common to the Mediterranean Diet include: apples, apricots, avocados, cherries, clementines, dates, figs, grapefruits, grapes, melons, nectarines, oranges, peaches, pears, pomegranates, strawberries, tangerines  Fats - Replace butter and margarine with healthy oils, such as olive oil, canola oil, and tahini  - Limit nuts to no more than a handful a day  - Nuts include walnuts, almonds, pecans, pistachios, pine nuts  - Limit or avoid candied, honey roasted or heavily salted nuts - Olives are central to the [employer] - can be eaten whole or used in a variety of dishes   Meats Protein - Limiting red meat: no more than a few times a month - When eating red meat: choose lean cuts and keep the portion to the size of deck of cards - Eggs: approx. 0 to 4 times a week  - Fish and lean poultry: at least 2 a week  - Healthy protein sources include, chicken, [place], lean beef, lamb - Increase intake of seafood such as tuna, salmon, trout, mackerel, shrimp, scallops - Avoid or limit high fat processed meats such as sausage and bacon  Dairy - Include moderate amounts of low fat dairy products  - Focus on healthy dairy such as fat free yogurt, skim milk, low or reduced fat cheese - Limit dairy  products higher in fat such as whole or 2% milk, cheese, ice cream  Alcohol - Moderate amounts of red wine is ok  - No more than 5 oz daily for women (all ages) and men older than age 72  - No more than 10 oz of wine daily for men younger than 38  Other - Limit sweets and other desserts  - Use herbs and spices instead of salt to  flavor foods  - Herbs and spices common to the traditional Mediterranean Diet include: basil, bay leaves, chives, cloves, cumin, fennel, garlic, lavender, marjoram, mint, oregano, parsley, pepper, rosemary, sage, savory, sumac, tarragon, thyme   It's not just a diet, it's a lifestyle:  . The Mediterranean diet includes lifestyle factors typical of those in the region  . Foods, drinks and meals are best eaten with others and savored . Daily physical activity is important for overall good health . This could be strenuous exercise like running and aerobics . This could also be more leisurely activities such as walking, housework, yard-work, or taking the stairs . Moderation is the key; a balanced and healthy diet accommodates most foods and drinks . Consider portion sizes and frequency of consumption of certain foods   Meal Ideas & Options:  . Breakfast:  o Whole wheat toast or whole wheat English muffins with peanut butter & hard boiled egg o Steel cut oats topped with apples & cinnamon and skim milk  o Fresh fruit: banana, strawberries, melon, berries, peaches  o Smoothies: strawberries, bananas, greek yogurt, peanut butter o Low fat greek yogurt with blueberries and granola  o Egg white omelet with spinach and mushrooms o Breakfast couscous: whole wheat couscous, apricots, skim milk, cranberries  . Sandwiches:  o Hummus and grilled vegetables (peppers, zucchini, squash) on whole wheat bread         Grilled chicken on whole wheat pita with lettuce, tomatoes, cucumbers or tzatziki  o Tuna salad on whole wheat bread: tuna salad made with greek yogurt, olives, red peppers, capers, green onions o Garlic rosemary lamb pita: lamb sauted with garlic, rosemary, salt & pepper; add lettuce, cucumber, greek yogurt to pita - flavor with lemon juice and black pepper  . Seafood:  o Mediterranean grilled salmon, seasoned with garlic, basil, parsley, lemon juice and black pepper o Shrimp, lemon, and  spinach whole-grain pasta salad made with low fat greek yogurt  o Seared scallops with lemon orzo  o Seared tuna steaks seasoned salt, pepper, coriander topped with tomato mixture of olives, tomatoes, olive oil, minced garlic, parsley, green onions and cappers  . Meats:  o Herbed greek chicken salad with kalamata olives, cucumber, feta  o Red bell peppers stuffed with spinach, bulgur, lean ground beef (or lentils) & topped with feta   o Kebabs: skewers of chicken, tomatoes, onions, zucchini, squash  o [place] burgers: made with red onions, mint, dill, lemon juice, feta cheese topped with roasted red peppers . Vegetarian o Cucumber salad: cucumbers, artichoke hearts, celery, red onion, feta cheese, tossed in olive oil & lemon juice  o Hummus and whole grain pita points with a greek salad (lettuce, tomato, feta, olives, cucumbers, red onion) o Lentil soup with celery, carrots made with vegetable broth, garlic, salt and pepper  o Tabouli salad: parsley, bulgur, mint, scallions, cucumbers, tomato, radishes, lemon juice, olive oil, salt and pepper. 6.  We will check TSH and B12 levels 7.  Follow up in one year or sooner if you feel memory is worse.

## 2016-01-08 ENCOUNTER — Telehealth: Payer: Self-pay

## 2016-01-08 NOTE — Telephone Encounter (Signed)
-----   Message from Pieter Partridge, DO sent at 01/07/2016  7:47 PM EDT ----- Labs are normal

## 2016-01-08 NOTE — Telephone Encounter (Signed)
Mychart message sent.

## 2016-01-09 ENCOUNTER — Telehealth: Payer: Self-pay | Admitting: Family Medicine

## 2016-01-09 NOTE — Telephone Encounter (Signed)
Pt had lots of questions - recommended pt to schedule appt - appt made

## 2016-01-09 NOTE — Telephone Encounter (Signed)
Patient is calling to talk to you regarding getting worse, and is in terrible pain please call her at asap at [phone]

## 2016-01-12 ENCOUNTER — Encounter: Payer: Self-pay | Admitting: Family Medicine

## 2016-01-12 ENCOUNTER — Ambulatory Visit (INDEPENDENT_AMBULATORY_CARE_PROVIDER_SITE_OTHER): Payer: Medicare Other | Admitting: Family Medicine

## 2016-01-12 VITALS — BP 164/68 | HR 76 | Temp 98.3°F | Resp 18 | Ht 59.0 in | Wt 166.0 lb

## 2016-01-12 DIAGNOSIS — R29898 Other symptoms and signs involving the musculoskeletal system: Secondary | ICD-10-CM

## 2016-01-12 DIAGNOSIS — M549 Dorsalgia, unspecified: Secondary | ICD-10-CM | POA: Diagnosis not present

## 2016-01-12 DIAGNOSIS — G8929 Other chronic pain: Secondary | ICD-10-CM | POA: Diagnosis not present

## 2016-01-12 MED ORDER — FENTANYL 25 MCG/HR TD PT72: 25.0000 ug | MEDICATED_PATCH | TRANSDERMAL | Status: DC

## 2016-01-12 NOTE — Progress Notes (Signed)
Subjective:    Patient ID: Brianna Jacobs, female    DOB: 07-21-1930, 82 y.o.   MRN: [id number]  HPI  01/05/16 Had MRI 2011 of lumbar spine:  T11-T12: Retropulsion produces mild central stenosis. Flattening the ventral aspect of the thoracic cord. T12-L1: Desiccated disc with broad-based posterior protrusion. Mild bilateral facet hypertrophy and ligamentum flavum redundancy without significant stenosis. L1-L2: Disc desiccation with broad-based posterior disc protrusion. Mild central stenosis. Mild bilateral facet hypertrophy. Foramina and lateral recesses patent. L2-L3: Severely degenerated and desiccated disc with degenerative endplate changes. Right greater than left facet hypertrophy. Central canal and lateral recesses patent. Moderate right foraminal stenosis associated with scoliosis and osteophytes. Right lateral disc protrusion and osteophyte could affect the exiting right L2 nerve root. Lateral protrusion and osteophyte are new compared to prior exam. L3-L4: Degenerated disc. Left eccentric broad-based posterior disc protrusion extending into the left neural foramen. Bilateral facet hypertrophy with moderate central stenosis. Left lateral recess stenosis. Moderate right foraminal stenosis with mild left foraminal stenosis. L4-L5: Severely degenerated and desiccated disc with endplate changes. Severe bilateral facet arthrosis and ligamentum flavum redundancy. Left foraminal disc protrusion and severe left foraminal stenosis with compression of the exiting left L4 nerve. Left greater than right bilateral subarticular lateral recess stenosis. Minimal right foraminal stenosis. Mild to moderate central stenosis. L5-S1: Grade 1 anterolisthesis with uncoverage of the disc and a broad-based protrusion. Bilateral lateral recess stenosis. Severe bilateral facet arthrosis with bilateral facet effusions. Mild central stenosis. Severe bilateral foraminal  stenosis with compression of both L5 nerve roots.  Patient has a long-standing history of lower back problems as evidenced in the MRI report from 2011 listed above. She also has a history of 2 separate vertebral fractures in the thoracic spine show normal x-ray of the thoracic spine in 2013. However this weekend, the patient stumbled and fell in a restaurant. Shortly thereafter, she developed severe sharp stabbing pain up and down the thoracic spine. The pain is primarily located in the paraspinal areas on either side of thoracic spine. She denies any new numbness or weakness in the right leg. However there is some new numbness and tingling located on the lateral aspect of the left thigh. She has chronic weakness in both legs and chronic neuropathy in both legs. There is no tenderness to palpation over the spinous processes of the thoracic spine or in the thoracic spine paraspinal muscles however she states the pain is most intense it has been in years.  At that time, my plan was: Differential diagnosis includes herniated disc in the thoracic spine after a fall versus vertebral fracture in the thoracic spine after a fall. I like the patient to go immediately for an x-ray of her thoracic spine. If there is a vertebral fracture, we can discuss possible kyphoplasty versus pain management. However if there is no evidence of a new vertebral fracture, I will treat the patient empirically as possibly having a herniated disc with a prednisone taper pack. I will await the results of the x-ray of the thoracic spine.  01/12/16 Patient is here today for follow-up. The pain in the center of her back is intensifying. It is 9 on a scale of 10. She is unable to walk without a walker. She has fallen 4 separate times due to the pain in her back and the worsening weakness in both legs. She also reports worsening numbness in both legs. The pain is primarily between the levels of T7 and T12. She is able to stand  from a seated  position with assistance. However she screams in pain when she does this. X-rays of the thoracic spine reveal degenerative disc disease, levoscoliosis, and stable old compression fractures with no acute changes. Prednisone taper pack was tried without relief Past Medical History  Diagnosis Date  . Asthma     occassional usage of inhalers  . Depression   . ADD (attention deficit disorder)   . Hypertension   . Cancer [facility])     colon cancer, 16 yrs ago  . Bowel obstruction ([place])   . Neuromuscular disorder ([facility])     neuropathy  . Arthritis   . Spinal stenosis   . Scoliosis   . Cataract   . GERD (gastroesophageal reflux disease)   . Osteoporosis    Past Surgical History  Procedure Laterality Date  . Sigmoid resection / rectopexy    . Colonoscopy      2 yrs ago  . Eye surgery      cataracts bilat  . Joint replacement      bilat shoulders  . Fracture surgery      thoracic spine  . Abdominal hysterectomy  1972    partial  . Spinal cord stimulator insertion  11/18/2011    Procedure: LUMBAR SPINAL CORD STIMULATOR INSERTION;  Surgeon: Brianna Bailiff, MD;  Location: [facility];  Service: Orthopedics;  Laterality: N/A;  Thoracic 9 laminiotomy, SPINAL CORD STIMULATOR PLACEMENT thoracic 9- Thoracic 7   Current Outpatient Prescriptions on File Prior to Visit  Medication Sig Dispense Refill  . amoxicillin (AMOXIL) 500 MG capsule Take 500 mg by mouth 3 (three) times daily. Take 4 capsules 1 hour prior to dental appt    . aspirin 81 MG chewable tablet Chew 81 mg by mouth every other day.    . Calcium Carbonate-Vitamin D (CALCIUM + D) 600-200 MG-UNIT TABS Take 1 tablet by mouth daily.    . cetirizine (ZYRTEC) 10 MG tablet TAKE 1 TABLET DAILY 90 tablet 2  . cyanocobalamin 2000 MCG tablet Take 2,000 mcg by mouth daily.    . Fish Oil OIL by Does not apply route. 4 tabs po qd    . Fluticasone Furoate-Vilanterol (BREO ELLIPTA) 100-25 MCG/INH AEPB Inhale 1 puff into the lungs daily. 90 each 1  .  losartan (COZAAR) 50 MG tablet TAKE 1 TABLET DAILY 90 tablet 2  . Melatonin 10 MG TABS Take 1 tablet by mouth at bedtime.    Marland Kitchen oxyCODONE-acetaminophen (PERCOCET) 10-325 MG tablet .5 - 1 tab po Q 6hours 90 tablet 0  . pantoprazole (PROTONIX) 40 MG tablet TAKE 1 TABLET DAILY 90 tablet 2  . Propylene Glycol (SYSTANE BALANCE OP) Apply 1 drop to eye as needed. For dry eyes    . ranitidine (ZANTAC) 150 MG tablet TAKE 1 TABLET TWICE A DAY 180 tablet 3  . sucralfate (CARAFATE) 1 g tablet Take 1 tablet (1 g total) by mouth 4 (four) times daily -  with meals and at bedtime. 450 tablet 3  . Triamcinolone Acetonide (TRIAMCINOLONE 0.1 % CREAM : EUCERIN) CREA Apply 1 application topically 2 (two) times daily as needed. 1 each 2  . UCERIS 9 MG TB24 Take 1 tablet by mouth daily.    Marland Kitchen XIIDRA 5 % SOLN      No current facility-administered medications on file prior to visit.   Allergies  Allergen Reactions  . Cymbalta [Duloxetine Hcl] Other (See Comments)    Makes me lethargic  . Lyrica [Pregabalin] Other (See Comments)  Makes me lethargic  . Ranitidine Diarrhea  . Celebrex [Celecoxib] Rash  . Naprosyn [Naproxen] Rash   Social History   Social History  . Marital Status: Married    Spouse Name: N/A  . Number of Children: 3  . Years of Education: N/A   Occupational History  . Not on file.   Social History Main Topics  . Smoking status: Former Smoker    Quit date: 10/11/1977  . Smokeless tobacco: Never Used  . Alcohol Use: No  . Drug Use: Yes    Special: Oxycodone  . Sexual Activity: Not on file   Other Topics Concern  . Not on file   Social History Narrative   Lives at home with husband.     Review of Systems  All other systems reviewed and are negative.      Objective:   Physical Exam  Constitutional: She appears well-developed and well-nourished.  Cardiovascular: Normal rate, regular rhythm and normal heart sounds.   No murmur heard. Pulmonary/Chest: Effort normal and  breath sounds normal. No respiratory distress. She has no wheezes. She has no rales.  Abdominal: Soft. Bowel sounds are normal.  Vitals reviewed.         Assessment & Plan:  Mid-back pain, acute - Plan: fentaNYL (DURAGESIC - DOSED MCG/HR) 25 MCG/HR patch, CT Thoracic Spine W Contrast, DISCONTINUED: fentaNYL (DURAGESIC - DOSED MCG/HR) 25 MCG/HR patch  Chronic back pain - Plan: CT Thoracic Spine W Contrast  Leg weakness, bilateral - Plan: CT Thoracic Spine W Contrast  I'm concerned the patient may have spinal stenosis developing in the thoracic spine. She is unable to have an MRI of the thoracic spine due to the presence of a spinal cord stimulator. Therefore I'll schedule the patient for CT of the thoracic spine to characterize further. Meanwhile she can use fentanyl 25 g per hour every 72 hours and use oxycodone for breakthrough pain.

## 2016-01-13 ENCOUNTER — Encounter: Payer: Self-pay | Admitting: Family Medicine

## 2016-01-13 ENCOUNTER — Other Ambulatory Visit: Payer: Self-pay | Admitting: Family Medicine

## 2016-01-13 ENCOUNTER — Encounter: Payer: Self-pay | Admitting: *Deleted

## 2016-01-13 DIAGNOSIS — M549 Dorsalgia, unspecified: Secondary | ICD-10-CM

## 2016-01-13 DIAGNOSIS — R29898 Other symptoms and signs involving the musculoskeletal system: Secondary | ICD-10-CM

## 2016-01-13 DIAGNOSIS — G8929 Other chronic pain: Secondary | ICD-10-CM

## 2016-01-15 ENCOUNTER — Ambulatory Visit
Admission: RE | Admit: 2016-01-15 | Discharge: 2016-01-15 | Disposition: A | Discharge status: Home / Self Care | Payer: Medicare Other | Source: Ambulatory Visit | Attending: Family Medicine | Admitting: Family Medicine

## 2016-01-15 DIAGNOSIS — R29898 Other symptoms and signs involving the musculoskeletal system: Secondary | ICD-10-CM

## 2016-01-15 DIAGNOSIS — M549 Dorsalgia, unspecified: Secondary | ICD-10-CM

## 2016-01-15 DIAGNOSIS — S22060A Wedge compression fracture of T7-T8 vertebra, initial encounter for closed fracture: Secondary | ICD-10-CM | POA: Diagnosis not present

## 2016-01-15 DIAGNOSIS — G8929 Other chronic pain: Secondary | ICD-10-CM

## 2016-01-16 ENCOUNTER — Other Ambulatory Visit: Payer: Self-pay | Admitting: Family Medicine

## 2016-01-16 ENCOUNTER — Inpatient Hospital Stay: Admission: RE | Admit: 2016-01-16 | Payer: Self-pay | Source: Ambulatory Visit

## 2016-01-16 DIAGNOSIS — M810 Age-related osteoporosis without current pathological fracture: Secondary | ICD-10-CM

## 2016-01-16 MED ORDER — ALENDRONATE SODIUM 70 MG PO TABS: 70.0000 mg | ORAL_TABLET | ORAL | Status: DC

## 2016-01-19 ENCOUNTER — Other Ambulatory Visit: Payer: Self-pay | Admitting: Family Medicine

## 2016-01-19 ENCOUNTER — Encounter: Payer: Self-pay | Admitting: Family Medicine

## 2016-01-19 ENCOUNTER — Telehealth: Payer: Self-pay | Admitting: *Deleted

## 2016-01-19 DIAGNOSIS — S22060A Wedge compression fracture of T7-T8 vertebra, initial encounter for closed fracture: Secondary | ICD-10-CM

## 2016-01-19 NOTE — Telephone Encounter (Signed)
Referral placed to Dr. Dora Sims

## 2016-01-19 NOTE — Telephone Encounter (Signed)
-----   Message from [name], MD sent at 01/19/2016 11:17 AM EDT ----- Patient needs to see Dr. Estanislado Pandy ASAP with interventional radiology for kyphoplasty for new T7 compression fracture.

## 2016-01-22 ENCOUNTER — Other Ambulatory Visit: Payer: Self-pay | Admitting: *Deleted

## 2016-01-22 DIAGNOSIS — M810 Age-related osteoporosis without current pathological fracture: Secondary | ICD-10-CM

## 2016-01-22 MED ORDER — ALENDRONATE SODIUM 70 MG PO TABS: 70.0000 mg | ORAL_TABLET | ORAL | Status: DC

## 2016-01-22 NOTE — Telephone Encounter (Signed)
Received fax requesting refill on Fosamax.   Refill appropriate and filled per protocol.  

## 2016-01-24 ENCOUNTER — Encounter: Payer: Self-pay | Admitting: Family Medicine

## 2016-01-27 ENCOUNTER — Other Ambulatory Visit (HOSPITAL_COMMUNITY): Payer: Self-pay | Admitting: Interventional Radiology

## 2016-01-27 ENCOUNTER — Telehealth: Payer: Self-pay | Admitting: Family Medicine

## 2016-01-27 DIAGNOSIS — M549 Dorsalgia, unspecified: Secondary | ICD-10-CM

## 2016-01-27 DIAGNOSIS — IMO0002 Reserved for concepts with insufficient information to code with codable children: Secondary | ICD-10-CM

## 2016-01-27 MED ORDER — OXYCODONE-ACETAMINOPHEN 10-325 MG PO TABS: ORAL_TABLET | ORAL | Status: DC

## 2016-01-27 NOTE — Telephone Encounter (Signed)
?   OK to Refill  

## 2016-01-27 NOTE — Telephone Encounter (Signed)
Pt is requesting a refill of Oxycodone 10-325 [phone]

## 2016-01-27 NOTE — Telephone Encounter (Signed)
ok 

## 2016-01-28 NOTE — Telephone Encounter (Signed)
RX printed, left up front and patient aware to pick up  

## 2016-01-28 NOTE — Telephone Encounter (Signed)
Pt is requesting a refill on her Fentyl patch as well - Ok to refill?? LRF - 01/12/16

## 2016-01-29 MED ORDER — FENTANYL 25 MCG/HR TD PT72
25.0000 ug | MEDICATED_PATCH | TRANSDERMAL | Status: DC
Start: 2016-01-29 — End: 2016-03-11

## 2016-01-29 NOTE — Telephone Encounter (Signed)
Make sure she is using them right.  Put on for 72 hours so 10 patches should have lasted a month.  It hasn't been 17 days yet????

## 2016-01-29 NOTE — Telephone Encounter (Signed)
RX printed, left up front and patient aware to pick up  

## 2016-01-29 NOTE — Telephone Encounter (Signed)
Pt was only given 5 patches - per wtp ok to refill for 10 patches

## 2016-01-30 ENCOUNTER — Ambulatory Visit (HOSPITAL_COMMUNITY): Payer: Medicare Other

## 2016-02-02 ENCOUNTER — Other Ambulatory Visit: Payer: Self-pay | Admitting: Radiology

## 2016-02-02 NOTE — Patient Instructions (Signed)
NPO after MN needs driver, am b/p meds

## 2016-02-03 ENCOUNTER — Ambulatory Visit (HOSPITAL_COMMUNITY)
Admission: RE | Admit: 2016-02-03 | Discharge: 2016-02-03 | Disposition: A | Discharge status: Home / Self Care | Payer: Medicare Other | Source: Ambulatory Visit | Attending: Interventional Radiology | Admitting: Interventional Radiology

## 2016-02-03 ENCOUNTER — Encounter (HOSPITAL_COMMUNITY): Payer: Self-pay

## 2016-02-03 DIAGNOSIS — J45909 Unspecified asthma, uncomplicated: Secondary | ICD-10-CM | POA: Insufficient documentation

## 2016-02-03 DIAGNOSIS — F988 Other specified behavioral and emotional disorders with onset usually occurring in childhood and adolescence: Secondary | ICD-10-CM | POA: Insufficient documentation

## 2016-02-03 DIAGNOSIS — F329 Major depressive disorder, single episode, unspecified: Secondary | ICD-10-CM | POA: Diagnosis not present

## 2016-02-03 DIAGNOSIS — I10 Essential (primary) hypertension: Secondary | ICD-10-CM | POA: Diagnosis not present

## 2016-02-03 DIAGNOSIS — IMO0002 Reserved for concepts with insufficient information to code with codable children: Secondary | ICD-10-CM

## 2016-02-03 DIAGNOSIS — K219 Gastro-esophageal reflux disease without esophagitis: Secondary | ICD-10-CM | POA: Insufficient documentation

## 2016-02-03 DIAGNOSIS — Z87891 Personal history of nicotine dependence: Secondary | ICD-10-CM | POA: Diagnosis not present

## 2016-02-03 DIAGNOSIS — Z8249 Family history of ischemic heart disease and other diseases of the circulatory system: Secondary | ICD-10-CM | POA: Diagnosis not present

## 2016-02-03 DIAGNOSIS — Z85038 Personal history of other malignant neoplasm of large intestine: Secondary | ICD-10-CM | POA: Insufficient documentation

## 2016-02-03 DIAGNOSIS — Z7982 Long term (current) use of aspirin: Secondary | ICD-10-CM | POA: Diagnosis not present

## 2016-02-03 DIAGNOSIS — M546 Pain in thoracic spine: Secondary | ICD-10-CM | POA: Diagnosis not present

## 2016-02-03 DIAGNOSIS — M4854XA Collapsed vertebra, not elsewhere classified, thoracic region, initial encounter for fracture: Secondary | ICD-10-CM | POA: Insufficient documentation

## 2016-02-03 LAB — BASIC METABOLIC PANEL
Anion gap: 12 (ref 5–15)
BUN: 10 mg/dL (ref 6–20)
CALCIUM: 9.8 mg/dL (ref 8.9–10.3)
CO2: 22 mmol/L (ref 22–32)
Chloride: 105 mmol/L (ref 101–111)
Creatinine, Ser: 0.74 mg/dL (ref 0.44–1.00)
GFR calc Af Amer: >60 mL/min (ref >60)
GFR calc non Af Amer: >60 mL/min (ref >60)
GLUCOSE: 104 mg/dL — AB (ref 65–99)
POTASSIUM: 3.5 mmol/L (ref 3.5–5.1)
Sodium: 139 mmol/L (ref 135–145)

## 2016-02-03 LAB — CBC
HEMATOCRIT: 38.5 % (ref 36.0–46.0)
Hemoglobin: 13 g/dL (ref 12.0–15.0)
MCH: 31.6 pg (ref 26.0–34.0)
MCHC: 33.8 g/dL (ref 30.0–36.0)
MCV: 93.7 fL (ref 78.0–100.0)
Platelets: 339 10*3/uL (ref 150–400)
RBC: 4.11 MIL/uL (ref 3.87–5.11)
RDW: 13.1 % (ref 11.5–15.5)
WBC: 7.3 10*3/uL (ref 4.0–10.5)

## 2016-02-03 LAB — PROTIME-INR
INR: 1.06 (ref 0.00–1.49)
Prothrombin Time: 14 seconds (ref 11.6–15.2)

## 2016-02-03 LAB — APTT: aPTT: 25 seconds (ref 24–37)

## 2016-02-03 MED ORDER — FENTANYL CITRATE (PF) 100 MCG/2ML IJ SOLN
INTRAMUSCULAR | Status: AC | PRN
  Administered 2016-02-03: 12.5 ug via INTRAVENOUS
  Administered 2016-02-03 (×3): 25 ug via INTRAVENOUS

## 2016-02-03 MED ORDER — IOPAMIDOL (ISOVUE-300) INJECTION 61%
INTRAVENOUS | Status: AC
  Administered 2016-02-03: 50 mL
  Filled 2016-02-03: qty 50

## 2016-02-03 MED ORDER — OXYCODONE-ACETAMINOPHEN 5-325 MG PO TABS
2.0000 | ORAL_TABLET | Freq: Once | ORAL | Status: AC
  Administered 2016-02-03: 2 via ORAL

## 2016-02-03 MED ORDER — OXYCODONE-ACETAMINOPHEN 5-325 MG PO TABS
ORAL_TABLET | ORAL | Status: DC
Start: 2016-02-03 — End: 2016-02-04
  Filled 2016-02-03: qty 2

## 2016-02-03 MED ORDER — FENTANYL CITRATE (PF) 100 MCG/2ML IJ SOLN
INTRAMUSCULAR | Status: AC
  Filled 2016-02-03: qty 4

## 2016-02-03 MED ORDER — SODIUM CHLORIDE 0.9 % IV SOLN
Freq: Once | INTRAVENOUS | Status: AC
  Administered 2016-02-03: 09:00:00 via INTRAVENOUS

## 2016-02-03 MED ORDER — CEFAZOLIN SODIUM-DEXTROSE 2-3 GM-% IV SOLR
INTRAVENOUS | Status: AC
  Filled 2016-02-03: qty 50

## 2016-02-03 MED ORDER — TOBRAMYCIN SULFATE 1.2 G IJ SOLR
INTRAMUSCULAR | Status: AC
  Administered 2016-02-03: 11:00:00 via SURGICAL_CAVITY
  Filled 2016-02-03: qty 1.2

## 2016-02-03 MED ORDER — HYDROMORPHONE HCL 1 MG/ML IJ SOLN
INTRAMUSCULAR | Status: AC
  Filled 2016-02-03: qty 2

## 2016-02-03 MED ORDER — CEFAZOLIN SODIUM-DEXTROSE 2-4 GM/100ML-% IV SOLN
2.0000 g | INTRAVENOUS | Status: AC
  Administered 2016-02-03: 2 g via INTRAVENOUS

## 2016-02-03 MED ORDER — SODIUM CHLORIDE 0.9 % IV SOLN: INTRAVENOUS | Status: AC

## 2016-02-03 MED ORDER — MIDAZOLAM HCL 2 MG/2ML IJ SOLN
INTRAMUSCULAR | Status: AC | PRN
  Administered 2016-02-03 (×3): 1 mg via INTRAVENOUS

## 2016-02-03 MED ORDER — BUPIVACAINE HCL (PF) 0.25 % IJ SOLN
INTRAMUSCULAR | Status: AC
  Administered 2016-02-03: 30 mL
  Filled 2016-02-03: qty 30

## 2016-02-03 MED ORDER — MIDAZOLAM HCL 2 MG/2ML IJ SOLN
INTRAMUSCULAR | Status: AC
  Filled 2016-02-03: qty 6

## 2016-02-03 NOTE — H&P (Signed)
Chief Complaint: Patient was seen in consultation today for Thoracic 7 vertebroplasty/kyphoplasty at the request of Dr Jenna Luo  Referring Physician(s): Dr Jenna Luo  Supervising Physician: Luanne Bras  Patient Status:  Out-pt  History of Present Illness: YAZLENE BUCHMANN is a 82 y.o. female   Pt has hx of L1 KP 2014 T9 and T10 VP 2014 Also Hx "pain stimulator" implanted 4 yrs ago "From Thoracic 7 to Rt buttock area"  Pt with new back pain x 3-4 weeks Worsening at this point Pain located shoulder blade area Follows around to beneath Left breast  Remembers twisting back few times 3 weeks ago CT 01/15/2016: IMPRESSION: 1. New, from 2015, T7 compression fracture with minimal height loss and no retropulsion. An acute fracture line is not seen but given the history and subtle perispinal edema favor subacute timing. 2. Remote T9, T10, T12, and L1 compression fractures as described.  Dr Estanislado Pandy has reviewed imaging and approves T7 VP/KP  Past Medical History  Diagnosis Date  . Asthma     occassional usage of inhalers  . Depression   . ADD (attention deficit disorder)   . Hypertension   . Cancer [facility])     colon cancer, 16 yrs ago  . Bowel obstruction ([place])   . Neuromuscular disorder (HCC)     neuropathy  . Arthritis   . Spinal stenosis   . Scoliosis   . Cataract   . GERD (gastroesophageal reflux disease)   . Osteoporosis     Past Surgical History  Procedure Laterality Date  . Sigmoid resection / rectopexy    . Colonoscopy      2 yrs ago  . Eye surgery      cataracts bilat  . Joint replacement      bilat shoulders  . Fracture surgery      thoracic spine  . Abdominal hysterectomy  1972    partial  . Spinal cord stimulator insertion  11/18/2011    Procedure: LUMBAR SPINAL CORD STIMULATOR INSERTION;  Surgeon: Dahlia Bailiff, MD;  Location: [place];  Service: Orthopedics;  Laterality: N/A;  Thoracic 9 laminiotomy, SPINAL CORD STIMULATOR PLACEMENT  thoracic 9- Thoracic 7    Allergies: Cymbalta; Lyrica; Ranitidine; Celebrex; and Naprosyn  Medications: Prior to Admission medications   Medication Sig Start Date End Date Taking? Authorizing Provider  alendronate (FOSAMAX) 70 MG tablet Take 1 tablet (70 mg total) by mouth every 7 (seven) days. Take with a full glass of water on an empty stomach. 01/22/16  Yes [name], MD  aspirin 81 MG chewable tablet Chew 81 mg by mouth every other day.   Yes Historical Provider, MD  Calcium Carbonate-Vitamin D (CALCIUM + D) 600-200 MG-UNIT TABS Take 1 tablet by mouth daily.   Yes Historical Provider, MD  cetirizine (ZYRTEC) 10 MG tablet TAKE 1 TABLET DAILY 09/08/15  Yes Alycia Rossetti, MD  cyanocobalamin 2000 MCG tablet Take 2,000 mcg by mouth daily.   Yes Historical Provider, MD  fentaNYL (DURAGESIC - DOSED MCG/HR) 25 MCG/HR patch Place 1 patch (25 mcg total) onto the skin every 3 (three) days. 01/29/16  Yes [name], MD  Fluticasone Furoate-Vilanterol (BREO ELLIPTA) 100-25 MCG/INH AEPB Inhale 1 puff into the lungs daily. 11/14/15  Yes [name], MD  losartan (COZAAR) 50 MG tablet TAKE 1 TABLET DAILY 11/14/15  Yes [name], MD  Melatonin 10 MG TABS Take 1 tablet by mouth at bedtime.   Yes Historical Provider,  MD  Omega-3 Fatty Acids (FISH OIL) 1200 MG CPDR Take 4 capsules by mouth daily.   Yes Historical Provider, MD  oxyCODONE-acetaminophen (PERCOCET) 10-325 MG tablet .5 - 1 tab po Q 6hours 01/27/16  Yes [name], MD  pantoprazole (PROTONIX) 40 MG tablet TAKE 1 TABLET DAILY 09/08/15  Yes Alycia Rossetti, MD  Propylene Glycol (SYSTANE BALANCE OP) Apply 1 drop to eye as needed. For dry eyes   Yes Historical Provider, MD  ranitidine (ZANTAC) 150 MG tablet TAKE 1 TABLET TWICE A DAY 12/24/15  Yes [name], MD  sucralfate (CARAFATE) 1 g tablet Take 1 tablet (1 g total) by mouth 4 (four) times daily -  with meals and at bedtime. 12/26/15  Yes [name], MD    UCERIS 9 MG TB24 Take 1 tablet by mouth daily. 06/08/15  Yes Historical Provider, MD  XIIDRA 5 % SOLN Place 1 drop into both eyes 2 (two) times daily.  12/20/15  Yes Historical Provider, MD  amoxicillin (AMOXIL) 500 MG capsule Take 500 mg by mouth 3 (three) times daily. Take 4 capsules 1 hour prior to dental appt    Historical Provider, MD  Triamcinolone Acetonide (TRIAMCINOLONE 0.1 % CREAM : EUCERIN) CREA Apply 1 application topically 2 (two) times daily as needed. 08/28/15   Alycia Rossetti, MD     Family History  Problem Relation Age of Onset  . Breast cancer Sister   . Bladder Cancer Sister   . Colon cancer Mother   . Prostate cancer Brother   . Heart disease Brother     Pacemaker  . Heart attack Father     Died age 50    Social History   Social History  . Marital Status: Married    Spouse Name: N/A  . Number of Children: 3  . Years of Education: N/A   Social History Main Topics  . Smoking status: Former Smoker    Quit date: 10/11/1977  . Smokeless tobacco: Never Used  . Alcohol Use: No  . Drug Use: Yes    Special: Oxycodone  . Sexual Activity: Not Asked   Other Topics Concern  . None   Social History Narrative   Lives at home with husband.     Review of Systems: A 12 point ROS discussed and pertinent positives are indicated in the HPI above.  All other systems are negative.  Review of Systems  Constitutional: Positive for activity change. Negative for fever and fatigue.  Respiratory: Positive for cough. Negative for shortness of breath.   Gastrointestinal: Negative for abdominal pain.  Musculoskeletal: Positive for back pain and gait problem.  Neurological: Positive for weakness.  Psychiatric/Behavioral: Negative for behavioral problems and confusion.    Vital Signs: BP 148/98 mmHg  Pulse 83  Temp(Src) 97.9 F (36.6 C) (Oral)  Resp 18  SpO2 96%  Physical Exam  Constitutional: She is oriented to person, place, and time.  Cardiovascular: Normal rate,  regular rhythm and normal heart sounds.   Pulmonary/Chest: Effort normal and breath sounds normal.  Abdominal: Soft. Bowel sounds are normal.  Musculoskeletal: Normal range of motion.  Pain to palpate mid to upper back  Neurological: She is alert and oriented to person, place, and time.  Skin: Skin is warm and dry.  Psychiatric: She has a normal mood and affect. Her behavior is normal. Judgment and thought content normal.  Nursing note and vitals reviewed.   Mallampati Score:  MD Evaluation Airway: WNL Heart: WNL Abdomen: WNL  Chest/ Lungs: WNL ASA  Classification: 3 Mallampati/Airway Score: One  Imaging: Dg Thoracic Spine W/swimmers  01/05/2016  CLINICAL DATA:  Mid thoracic pain for a few weeks, no known injury, spinal cord stimulator placed 2013, spinal stenosis EXAM: THORACIC SPINE - 3 VIEWS COMPARISON:  Chest radiographs 08/23/2014; chest CT 09/27/2014 FINDINGS: Diffuse osseous demineralization. 12 pairs of ribs. Multilevel disc space narrowing and endplate spur formation. Spinal stimulator lead projects over T7-T8. Prior super endplate compression fractures and spinal augmentation procedures at T9, T10 and L1, stable. Additional marked superior endplate compression fracture of T12, unchanged. No fracture or bone destruction. Levoconvex thoracolumbar scoliosis. Multilevel degenerative disc and facet disease changes of the cervical spine. Degenerative disc disease changes of the upper thoracic spine, mild in degree. Atherosclerotic calcification aorta. IMPRESSION: Prior compression fractures and subsequent spinal augmentation procedures at T9, T10, and L1. Stable T12 superior endplate compression fracture. Osseous demineralization with scattered degenerative changes and scoliosis as above. No definite acute osseous abnormalities. Electronically Signed   By: Lavonia Dana M.D.   On: 01/05/2016 16:22   Ct Thoracic Spine Wo Contrast  01/16/2016  CLINICAL DATA:  Upper back pain and bilateral leg  weakness for a few weeks. Remote history of colon cancer. EXAM: CT THORACIC SPINE WITHOUT CONTRAST TECHNIQUE: Multidetector CT imaging of the thoracic spine was performed without intravenous contrast administration. Multiplanar CT image reconstructions were also generated. COMPARISON:  09/27/2014 FINDINGS: Since comparison chest CT there is superior endplate concavity and increased sclerosis in the upper T7 body consistent with interval compression fracture. An acute fracture line is not visualized but there is subtle perivertebral edema that is subtle. Previous T9, T10, and L1 compression fractures with cement augmentation. No acute on chronic fracture at these levels. Remote T12 compression fracture with greater than 75% height loss centrally. Dorsal column stimulator with leads at T7 and T8 level. These enter the canal at a T9 laminectomy. Advanced and diffuse degenerative disc disease with thoracic dextroscoliosis and partly visualized lumbar levoscoliosis. Advanced diffuse cervical disc narrowing and spurring. Facet arthropathy with C3-4 and C7-T1 anterolisthesis. No gross high-grade canal stenosis. Partly visualized abdomen and chest show no signs of acute cardiopulmonary disease or acute intra-abdominal process. IMPRESSION: 1. New, from 2015, T7 compression fracture with minimal height loss and no retropulsion. An acute fracture line is not seen but given the history and subtle perispinal edema favor subacute timing. 2. Remote T9, T10, T12, and L1 compression fractures as described. Electronically Signed   By: [place] Fantasia M.D.   On: 01/16/2016 08:25    Labs:  CBC:  Recent Labs  02/03/16 0840  WBC 7.3  HGB 13.0  HCT 38.5  PLT 339    COAGS:  Recent Labs  02/03/16 0840  INR 1.06  APTT 25    BMP:  Recent Labs  02/03/16 0840  NA 139  K 3.5  CL 105  CO2 22  GLUCOSE 104*  BUN 10  CALCIUM 9.8  CREATININE 0.74  GFRNONAA >60  GFRAA >60    LIVER FUNCTION TESTS: No results  for input(s): BILITOT, AST, ALT, ALKPHOS, PROT, ALBUMIN in the last 8760 hours.  TUMOR MARKERS: No results for input(s): AFPTM, CEA, CA199, CHROMGRNA in the last 8760 hours.  Assessment and Plan:  New back pain Worsening x 3-4 weeks New fx Thoracic 7 on recent CT Now scheduled for T7 vertebroplasty/kyphoplasty Risks and Benefits discussed with the patient including, but not limited to education regarding the natural healing process of compression fractures without intervention,  bleeding, infection, cement migration which may cause spinal cord damage, paralysis, pulmonary embolism or even death. All of the patient's questions were answered, patient is agreeable to proceed. Consent signed and in chart.    Thank you for this interesting consult.  I greatly enjoyed meeting KIMBERLE PATALANO and look forward to participating in their care.  A copy of this report was sent to the requesting provider on this date.  Electronically Signed: Monia Sabal A 02/03/2016, 9:13 AM   I spent a total of  30 Minutes   in face to face in clinical consultation, greater than 50% of which was counseling/coordinating care for T7 VP/KP

## 2016-02-03 NOTE — Sedation Documentation (Signed)
Patient is resting comfortably. 

## 2016-02-03 NOTE — Procedures (Signed)
S/P T 7 balloon KP

## 2016-02-03 NOTE — Sedation Documentation (Signed)
dsg to upper  T8 area intact

## 2016-02-03 NOTE — Discharge Instructions (Signed)
KYPHOPLASTY/VERTEBROPLASTY DISCHARGE INSTRUCTIONS ° °Medications: (check all that apply) ° °   Resume all home medications as before procedure. °                 °  °Continue your pain medications as prescribed as needed.  Over the next 3-5 days, decrease your pain medication as tolerated.  Over the counter medications (i.e. Tylenol, ibuprofen, and aleve) may be substituted once severe/moderate pain symptoms have subsided. ° ° Wound Care: °- Bandages may be removed the day following your procedure.  You may get your incision wet once bandages are removed.  Bandaids may be used to cover the incisions until scab formation.  Topical ointments are optional. ° °- If you develop a fever greater than 101 degrees, have increased skin redness at the incision sites or pus-like oozing from incisions occurring within 1 week of the procedure, contact radiology at [phone] or [phone]. ° °- Ice pack to back for 15-20 minutes 2-3 time per day for first 2-3 days post procedure.  The ice will expedite muscle healing and help with the pain from the incisions. ° ° Activity: °- Bedrest today with limited activity for 24 hours post procedure. ° °- No driving for 48 hours. ° °- Increase your activity as tolerated after bedrest (with assistance if necessary). ° °- Refrain from any strenuous activity or heavy lifting (greater than 10 lbs.). ° ° Follow up: °- Contact radiology at [phone] or [phone] if any questions/concerns. ° °- A physician assistant from radiology will contact you in approximately 1 week. ° °- If a biopsy was performed at the time of your procedure, your referring physician should receive the results in usually 2-3 days. ° ° ° ° ° ° ° ° °

## 2016-02-03 NOTE — Sedation Documentation (Addendum)
Patient starting to get sleepy

## 2016-02-05 ENCOUNTER — Other Ambulatory Visit (HOSPITAL_COMMUNITY): Payer: Self-pay | Admitting: Interventional Radiology

## 2016-02-05 DIAGNOSIS — IMO0002 Reserved for concepts with insufficient information to code with codable children: Secondary | ICD-10-CM

## 2016-02-19 ENCOUNTER — Encounter: Payer: Self-pay | Admitting: Family Medicine

## 2016-02-20 ENCOUNTER — Ambulatory Visit (INDEPENDENT_AMBULATORY_CARE_PROVIDER_SITE_OTHER): Payer: Medicare Other | Admitting: Family Medicine

## 2016-02-20 ENCOUNTER — Encounter: Payer: Self-pay | Admitting: Family Medicine

## 2016-02-20 VITALS — BP 148/84 | HR 78 | Temp 98.4°F | Resp 18 | Ht 59.5 in | Wt 157.0 lb

## 2016-02-20 DIAGNOSIS — M549 Dorsalgia, unspecified: Secondary | ICD-10-CM

## 2016-02-20 DIAGNOSIS — G8929 Other chronic pain: Secondary | ICD-10-CM

## 2016-02-20 DIAGNOSIS — S22000S Wedge compression fracture of unspecified thoracic vertebra, sequela: Secondary | ICD-10-CM | POA: Diagnosis not present

## 2016-02-20 MED ORDER — OXYCODONE-ACETAMINOPHEN 10-325 MG PO TABS: ORAL_TABLET | ORAL | Status: DC

## 2016-02-20 MED ORDER — FENTANYL 75 MCG/HR TD PT72: 75.0000 ug | MEDICATED_PATCH | TRANSDERMAL | Status: DC

## 2016-02-20 MED ORDER — CALCITONIN (SALMON) 200 UNIT/ACT NA SOLN: 1.0000 | Freq: Every day | NASAL | Status: DC

## 2016-02-20 NOTE — Progress Notes (Signed)
Subjective:    Patient ID: Edmonia James, female    DOB: 07-21-1930, 82 y.o.   MRN: [id number]  HPI  01/05/16 Had MRI 2011 of lumbar spine:  T11-T12: Retropulsion produces mild central stenosis. Flattening the ventral aspect of the thoracic cord. T12-L1: Desiccated disc with broad-based posterior protrusion. Mild bilateral facet hypertrophy and ligamentum flavum redundancy without significant stenosis. L1-L2: Disc desiccation with broad-based posterior disc protrusion. Mild central stenosis. Mild bilateral facet hypertrophy. Foramina and lateral recesses patent. L2-L3: Severely degenerated and desiccated disc with degenerative endplate changes. Right greater than left facet hypertrophy. Central canal and lateral recesses patent. Moderate right foraminal stenosis associated with scoliosis and osteophytes. Right lateral disc protrusion and osteophyte could affect the exiting right L2 nerve root. Lateral protrusion and osteophyte are new compared to prior exam. L3-L4: Degenerated disc. Left eccentric broad-based posterior disc protrusion extending into the left neural foramen. Bilateral facet hypertrophy with moderate central stenosis. Left lateral recess stenosis. Moderate right foraminal stenosis with mild left foraminal stenosis. L4-L5: Severely degenerated and desiccated disc with endplate changes. Severe bilateral facet arthrosis and ligamentum flavum redundancy. Left foraminal disc protrusion and severe left foraminal stenosis with compression of the exiting left L4 nerve. Left greater than right bilateral subarticular lateral recess stenosis. Minimal right foraminal stenosis. Mild to moderate central stenosis. L5-S1: Grade 1 anterolisthesis with uncoverage of the disc and a broad-based protrusion. Bilateral lateral recess stenosis. Severe bilateral facet arthrosis with bilateral facet effusions. Mild central stenosis. Severe bilateral foraminal  stenosis with compression of both L5 nerve roots.  Patient has a long-standing history of lower back problems as evidenced in the MRI report from 2011 listed above. She also has a history of 2 separate vertebral fractures in the thoracic spine show normal x-ray of the thoracic spine in 2013. However this weekend, the patient stumbled and fell in a restaurant. Shortly thereafter, she developed severe sharp stabbing pain up and down the thoracic spine. The pain is primarily located in the paraspinal areas on either side of thoracic spine. She denies any new numbness or weakness in the right leg. However there is some new numbness and tingling located on the lateral aspect of the left thigh. She has chronic weakness in both legs and chronic neuropathy in both legs. There is no tenderness to palpation over the spinous processes of the thoracic spine or in the thoracic spine paraspinal muscles however she states the pain is most intense it has been in years.  At that time, my plan was: Differential diagnosis includes herniated disc in the thoracic spine after a fall versus vertebral fracture in the thoracic spine after a fall. I like the patient to go immediately for an x-ray of her thoracic spine. If there is a vertebral fracture, we can discuss possible kyphoplasty versus pain management. However if there is no evidence of a new vertebral fracture, I will treat the patient empirically as possibly having a herniated disc with a prednisone taper pack. I will await the results of the x-ray of the thoracic spine.  01/12/16 Patient is here today for follow-up. The pain in the center of her back is intensifying. It is 9 on a scale of 10. She is unable to walk without a walker. She has fallen 4 separate times due to the pain in her back and the worsening weakness in both legs. She also reports worsening numbness in both legs. The pain is primarily between the levels of T7 and T12. She is able to stand  from a seated  position with assistance. However she screams in pain when she does this. X-rays of the thoracic spine reveal degenerative disc disease, levoscoliosis, and stable old compression fractures with no acute changes. Prednisone taper pack was tried without relief.  At that time, my plan was: I'm concerned the patient may have spinal stenosis developing in the thoracic spine. She is unable to have an MRI of the thoracic spine due to the presence of a spinal cord stimulator. Therefore I'll schedule the patient for CT of the thoracic spine to characterize further. Meanwhile she can use fentanyl 25 g per hour every 72 hours and use oxycodone for breakthrough pain.  02/20/16 CT revealed: 1. New, from 2015, T7 compression fracture with minimal height loss and no retropulsion. An acute fracture line is not seen but given the history and subtle perispinal edema favor subacute timing. 2. Remote T9, T10, T12, and L1 compression fractures as described.  Patient underwent T7 kyphoplasty and vertebroplasty 4/25.  I also recommended fosamax.  Patient states the pain is worse. She now screams in pain whenever she moves to get out of a chair. She denies any paralysis in her legs. She does state that the numbness in her legs is getting worse. Weakness in her legs is not getting worse. She denies any bowel or bladder incontinence. She denies any saddle anesthesia. She denies any shortness of breath although she states it hurts to take a deep breath in. She also complains of pain and difficulty and dysphasia/trouble swallowing. She blames this on the addition of Fosamax Past Medical History  Diagnosis Date  . Asthma     occassional usage of inhalers  . Depression   . ADD (attention deficit disorder)   . Hypertension   . Cancer [facility])     colon cancer, 16 yrs ago  . Bowel obstruction ([place])   . Neuromuscular disorder (HCC)     neuropathy  . Arthritis   . Spinal stenosis   . Scoliosis   . Cataract   . GERD  (gastroesophageal reflux disease)   . Osteoporosis    Past Surgical History  Procedure Laterality Date  . Sigmoid resection / rectopexy    . Colonoscopy      2 yrs ago  . Eye surgery      cataracts bilat  . Joint replacement      bilat shoulders  . Fracture surgery      thoracic spine  . Abdominal hysterectomy  1972    partial  . Spinal cord stimulator insertion  11/18/2011    Procedure: LUMBAR SPINAL CORD STIMULATOR INSERTION;  Surgeon: Dahlia Bailiff, MD;  [facility];  Service: Orthopedics;  Laterality: N/A;  Thoracic 9 laminiotomy, SPINAL CORD STIMULATOR PLACEMENT thoracic 9- Thoracic 7   Current Outpatient Prescriptions on File Prior to Visit  Medication Sig Dispense Refill  . alendronate (FOSAMAX) 70 MG tablet Take 1 tablet (70 mg total) by mouth every 7 (seven) days. Take with a full glass of water on an empty stomach. 12 tablet 4  . amoxicillin (AMOXIL) 500 MG capsule Take 500 mg by mouth 3 (three) times daily. Take 4 capsules 1 hour prior to dental appt    . aspirin 81 MG chewable tablet Chew 81 mg by mouth every other day.    . Calcium Carbonate-Vitamin D (CALCIUM + D) 600-200 MG-UNIT TABS Take 1 tablet by mouth daily.    . cetirizine (ZYRTEC) 10 MG tablet TAKE 1 TABLET DAILY 90 tablet 2  .  cyanocobalamin 2000 MCG tablet Take 2,000 mcg by mouth daily.    . fentaNYL (DURAGESIC - DOSED MCG/HR) 25 MCG/HR patch Place 1 patch (25 mcg total) onto the skin every 3 (three) days. 10 patch 0  . Fluticasone Furoate-Vilanterol (BREO ELLIPTA) 100-25 MCG/INH AEPB Inhale 1 puff into the lungs daily. 90 each 1  . losartan (COZAAR) 50 MG tablet TAKE 1 TABLET DAILY 90 tablet 2  . Melatonin 10 MG TABS Take 1 tablet by mouth at bedtime.    . Omega-3 Fatty Acids (FISH OIL) 1200 MG CPDR Take 4 capsules by mouth daily.    Marland Kitchen oxyCODONE-acetaminophen (PERCOCET) 10-325 MG tablet .5 - 1 tab po Q 6hours 90 tablet 0  . pantoprazole (PROTONIX) 40 MG tablet TAKE 1 TABLET DAILY 90 tablet 2  . Propylene  Glycol (SYSTANE BALANCE OP) Apply 1 drop to eye as needed. For dry eyes    . ranitidine (ZANTAC) 150 MG tablet TAKE 1 TABLET TWICE A DAY 180 tablet 3  . sucralfate (CARAFATE) 1 g tablet Take 1 tablet (1 g total) by mouth 4 (four) times daily -  with meals and at bedtime. 450 tablet 3  . Triamcinolone Acetonide (TRIAMCINOLONE 0.1 % CREAM : EUCERIN) CREA Apply 1 application topically 2 (two) times daily as needed. 1 each 2  . UCERIS 9 MG TB24 Take 1 tablet by mouth daily.    Marland Kitchen XIIDRA 5 % SOLN Place 1 drop into both eyes 2 (two) times daily.      No current facility-administered medications on file prior to visit.   Allergies  Allergen Reactions  . Cymbalta [Duloxetine Hcl] Other (See Comments)    Makes me lethargic  . Lyrica [Pregabalin] Other (See Comments)    Makes me lethargic  . Ranitidine Diarrhea  . Celebrex [Celecoxib] Rash  . Naprosyn [Naproxen] Rash   Social History   Social History  . Marital Status: Married    Spouse Name: N/A  . Number of Children: 3  . Years of Education: N/A   Occupational History  . Not on file.   Social History Main Topics  . Smoking status: Former Smoker    Quit date: 10/11/1977  . Smokeless tobacco: Never Used  . Alcohol Use: No  . Drug Use: Yes    Special: Oxycodone  . Sexual Activity: Not on file   Other Topics Concern  . Not on file   Social History Narrative   Lives at home with husband.     Review of Systems  All other systems reviewed and are negative.      Objective:   Physical Exam  Constitutional: She appears well-developed and well-nourished.  Cardiovascular: Normal rate, regular rhythm and normal heart sounds.   No murmur heard. Pulmonary/Chest: Effort normal and breath sounds normal. No respiratory distress. She has no wheezes. She has no rales.  Abdominal: Soft. Bowel sounds are normal.  Vitals reviewed.         Assessment & Plan:  Chronic back pain - Plan: fentaNYL (DURAGESIC) 75 MCG/HR, calcitonin,  salmon, (MIACALCIN) 200 UNIT/ACT nasal spray  Mid-back pain, acute - Plan: fentaNYL (DURAGESIC) 75 MCG/HR, calcitonin, salmon, (MIACALCIN) 200 UNIT/ACT nasal spray  Compression fx, thoracic spine, sequela  Very complicated situation. I explained to the patient I will focus on the pain first try to manage and control her pain and then see how many of her residual symptoms including dysphagia and trouble breathing persist. Increase fentanyl from 25 g per hour to 75 g per hour.  Continue use oxycodone 10/325 one every 6 hours as needed. Add calcitonin nasal spray daily for her acute thoracic compression fracture. Recheck in one week. If her pain is better and she continues to have dysphasia discontinue Fosamax and begin GI workup. I would replace Fosamax with prolia. I see no evidence of an infection, cauda equina syndrome, or pulmonary embolism

## 2016-02-24 DIAGNOSIS — H01022 Squamous blepharitis right lower eyelid: Secondary | ICD-10-CM | POA: Diagnosis not present

## 2016-02-24 DIAGNOSIS — H01025 Squamous blepharitis left lower eyelid: Secondary | ICD-10-CM | POA: Diagnosis not present

## 2016-02-24 DIAGNOSIS — H16223 Keratoconjunctivitis sicca, not specified as Sjogren's, bilateral: Secondary | ICD-10-CM | POA: Diagnosis not present

## 2016-02-25 ENCOUNTER — Ambulatory Visit (HOSPITAL_COMMUNITY)
Admission: RE | Admit: 2016-02-25 | Discharge: 2016-02-25 | Disposition: A | Discharge status: Home / Self Care | Payer: Medicare Other | Source: Ambulatory Visit | Attending: Interventional Radiology | Admitting: Interventional Radiology

## 2016-02-25 DIAGNOSIS — IMO0002 Reserved for concepts with insufficient information to code with codable children: Secondary | ICD-10-CM

## 2016-02-25 DIAGNOSIS — M545 Low back pain: Secondary | ICD-10-CM | POA: Diagnosis not present

## 2016-03-01 ENCOUNTER — Telehealth: Payer: Self-pay | Admitting: Family Medicine

## 2016-03-01 ENCOUNTER — Other Ambulatory Visit (HOSPITAL_COMMUNITY): Payer: Self-pay | Admitting: Interventional Radiology

## 2016-03-01 DIAGNOSIS — M549 Dorsalgia, unspecified: Secondary | ICD-10-CM

## 2016-03-01 DIAGNOSIS — IMO0002 Reserved for concepts with insufficient information to code with codable children: Secondary | ICD-10-CM

## 2016-03-01 NOTE — Telephone Encounter (Signed)
Pt received a call from her pharmacy saying that she has a Calcitonin rx ready to pick up. She does not remember ever taking this medication and would like a call back. [phone]

## 2016-03-02 NOTE — Telephone Encounter (Signed)
Pt's husband aware that medication is to help treat her bone pain and will pick it up - pt did not remember md prescribing it.

## 2016-03-03 ENCOUNTER — Encounter (HOSPITAL_COMMUNITY)
Admission: RE | Admit: 2016-03-03 | Discharge: 2016-03-03 | Disposition: A | Discharge status: Home / Self Care | Payer: Medicare Other | Source: Ambulatory Visit | Attending: Interventional Radiology | Admitting: Interventional Radiology

## 2016-03-03 DIAGNOSIS — T148 Other injury of unspecified body region: Secondary | ICD-10-CM | POA: Insufficient documentation

## 2016-03-03 DIAGNOSIS — X58XXXA Exposure to other specified factors, initial encounter: Secondary | ICD-10-CM | POA: Diagnosis not present

## 2016-03-03 DIAGNOSIS — M549 Dorsalgia, unspecified: Secondary | ICD-10-CM

## 2016-03-03 DIAGNOSIS — IMO0002 Reserved for concepts with insufficient information to code with codable children: Secondary | ICD-10-CM

## 2016-03-03 MED ORDER — TECHNETIUM TC 99M MEDRONATE IV KIT
25.0000 | PACK | Freq: Once | INTRAVENOUS | Status: AC | PRN
  Administered 2016-03-03: 25 via INTRAVENOUS

## 2016-03-04 ENCOUNTER — Telehealth (HOSPITAL_COMMUNITY): Payer: Self-pay

## 2016-03-04 NOTE — Telephone Encounter (Signed)
Informed pt that Dr. Estanislado Pandy took a look at her bone scan and that she did not have any new fractures. Pt agreed to f/u with PCP for pain management. AW

## 2016-03-09 ENCOUNTER — Telehealth: Payer: Self-pay | Admitting: Family Medicine

## 2016-03-09 DIAGNOSIS — M549 Dorsalgia, unspecified: Secondary | ICD-10-CM

## 2016-03-09 DIAGNOSIS — G8929 Other chronic pain: Secondary | ICD-10-CM

## 2016-03-09 NOTE — Telephone Encounter (Signed)
Pt is requesting a refill of Fentanyl patches.  Please call when ready to pick up. [phone]

## 2016-03-10 NOTE — Telephone Encounter (Signed)
Last seen 02/20/16 and last refill was 01/29/16.  Please advise

## 2016-03-11 MED ORDER — FENTANYL 75 MCG/HR TD PT72: 75.0000 ug | MEDICATED_PATCH | TRANSDERMAL | Status: DC

## 2016-03-11 NOTE — Telephone Encounter (Signed)
Prescription printed and patient made aware to come to office to pick up on 03/12/2016.

## 2016-03-11 NOTE — Telephone Encounter (Signed)
75 mcg patch.

## 2016-03-11 NOTE — Telephone Encounter (Signed)
[place] with refill on fentanyl.

## 2016-03-11 NOTE — Telephone Encounter (Signed)
Patient was last given Fentayl 15mcg on 02/20/2016, #5 patches.   Please clarify if patient should have 25 or 35mcg.

## 2016-03-18 ENCOUNTER — Ambulatory Visit (INDEPENDENT_AMBULATORY_CARE_PROVIDER_SITE_OTHER): Payer: Medicare Other | Admitting: Family Medicine

## 2016-03-18 ENCOUNTER — Encounter: Payer: Self-pay | Admitting: Family Medicine

## 2016-03-18 VITALS — BP 148/92 | HR 81 | Temp 98.1°F | Resp 18 | Ht 59.0 in | Wt 152.0 lb

## 2016-03-18 DIAGNOSIS — G8929 Other chronic pain: Secondary | ICD-10-CM

## 2016-03-18 DIAGNOSIS — S22000S Wedge compression fracture of unspecified thoracic vertebra, sequela: Secondary | ICD-10-CM

## 2016-03-18 DIAGNOSIS — G629 Polyneuropathy, unspecified: Secondary | ICD-10-CM | POA: Diagnosis not present

## 2016-03-18 DIAGNOSIS — M549 Dorsalgia, unspecified: Secondary | ICD-10-CM

## 2016-03-18 NOTE — Progress Notes (Signed)
Subjective:    Patient ID: Brianna Jacobs, female    DOB: 07-21-1930, 82 y.o.   MRN: [id number]  HPI  01/05/16 Had MRI 2011 of lumbar spine:  T11-T12: Retropulsion produces mild central stenosis. Flattening the ventral aspect of the thoracic cord. T12-L1: Desiccated disc with broad-based posterior protrusion. Mild bilateral facet hypertrophy and ligamentum flavum redundancy without significant stenosis. L1-L2: Disc desiccation with broad-based posterior disc protrusion. Mild central stenosis. Mild bilateral facet hypertrophy. Foramina and lateral recesses patent. L2-L3: Severely degenerated and desiccated disc with degenerative endplate changes. Right greater than left facet hypertrophy. Central canal and lateral recesses patent. Moderate right foraminal stenosis associated with scoliosis and osteophytes. Right lateral disc protrusion and osteophyte could affect the exiting right L2 nerve root. Lateral protrusion and osteophyte are new compared to prior exam. L3-L4: Degenerated disc. Left eccentric broad-based posterior disc protrusion extending into the left neural foramen. Bilateral facet hypertrophy with moderate central stenosis. Left lateral recess stenosis. Moderate right foraminal stenosis with mild left foraminal stenosis. L4-L5: Severely degenerated and desiccated disc with endplate changes. Severe bilateral facet arthrosis and ligamentum flavum redundancy. Left foraminal disc protrusion and severe left foraminal stenosis with compression of the exiting left L4 nerve. Left greater than right bilateral subarticular lateral recess stenosis. Minimal right foraminal stenosis. Mild to moderate central stenosis. L5-S1: Grade 1 anterolisthesis with uncoverage of the disc and a broad-based protrusion. Bilateral lateral recess stenosis. Severe bilateral facet arthrosis with bilateral facet effusions. Mild central stenosis. Severe bilateral foraminal  stenosis with compression of both L5 nerve roots.  Patient has a long-standing history of lower back problems as evidenced in the MRI report from 2011 listed above. She also has a history of 2 separate vertebral fractures in the thoracic spine show normal x-ray of the thoracic spine in 2013. However this weekend, the patient stumbled and fell in a restaurant. Shortly thereafter, she developed severe sharp stabbing pain up and down the thoracic spine. The pain is primarily located in the paraspinal areas on either side of thoracic spine. She denies any new numbness or weakness in the right leg. However there is some new numbness and tingling located on the lateral aspect of the left thigh. She has chronic weakness in both legs and chronic neuropathy in both legs. There is no tenderness to palpation over the spinous processes of the thoracic spine or in the thoracic spine paraspinal muscles however she states the pain is most intense it has been in years.  At that time, my plan was: Differential diagnosis includes herniated disc in the thoracic spine after a fall versus vertebral fracture in the thoracic spine after a fall. I like the patient to go immediately for an x-ray of her thoracic spine. If there is a vertebral fracture, we can discuss possible kyphoplasty versus pain management. However if there is no evidence of a new vertebral fracture, I will treat the patient empirically as possibly having a herniated disc with a prednisone taper pack. I will await the results of the x-ray of the thoracic spine.  01/12/16 Patient is here today for follow-up. The pain in the center of her back is intensifying. It is 9 on a scale of 10. She is unable to walk without a walker. She has fallen 4 separate times due to the pain in her back and the worsening weakness in both legs. She also reports worsening numbness in both legs. The pain is primarily between the levels of T7 and T12. She is able to stand  from a seated  position with assistance. However she screams in pain when she does this. X-rays of the thoracic spine reveal degenerative disc disease, levoscoliosis, and stable old compression fractures with no acute changes. Prednisone taper pack was tried without relief.  At that time, my plan was: I'm concerned the patient may have spinal stenosis developing in the thoracic spine. She is unable to have an MRI of the thoracic spine due to the presence of a spinal cord stimulator. Therefore I'll schedule the patient for CT of the thoracic spine to characterize further. Meanwhile she can use fentanyl 25 g per hour every 72 hours and use oxycodone for breakthrough pain.  02/20/16 CT revealed: 1. New, from 2015, T7 compression fracture with minimal height loss and no retropulsion. An acute fracture line is not seen but given the history and subtle perispinal edema favor subacute timing. 2. Remote T9, T10, T12, and L1 compression fractures as described.  Patient underwent T7 kyphoplasty and vertebroplasty 4/25.  I also recommended fosamax.  Patient states the pain is worse. She now screams in pain whenever she moves to get out of a chair. She denies any paralysis in her legs. She does state that the numbness in her legs is getting worse. Weakness in her legs is not getting worse. She denies any bowel or bladder incontinence. She denies any saddle anesthesia. She denies any shortness of breath although she states it hurts to take a deep breath in. She also complains of pain and difficulty and dysphasia/trouble swallowing. She blames this on the addition of Fosamax.  At that time, my plan was: Very complicated situation. I explained to the patient I will focus on the pain first try to manage and control her pain and then see how many of her residual symptoms including dysphagia and trouble breathing persist. Increase fentanyl from 25 g per hour to 75 g per hour. Continue use oxycodone 10/325 one every 6 hours as needed.  Add calcitonin nasal spray daily for her acute thoracic compression fracture. Recheck in one week. If her pain is better and she continues to have dysphasia discontinue Fosamax and begin GI workup. I would replace Fosamax with prolia. I see no evidence of an infection, cauda equina syndrome, or pulmonary embolism  03/18/16 Patient presents today complaining of severe pain in her back. She does think the fentanyl is helping. She is using Percocet 3-4 times a day. She uses a half a tablet to a whole tablet each time. She never started taking calcitonin due to concern regarding the side effects. She also reports worsening neuropathy in her right leg. Burning and stinging pain will wake her up at night radiating from her right thigh down to her right foot. The weakness in her legs is not getting worse. She denies any symptoms of cauda equina syndrome. Past Medical History  Diagnosis Date  . Asthma     occassional usage of inhalers  . Depression   . ADD (attention deficit disorder)   . Hypertension   . Cancer [facility])     colon cancer, 16 yrs ago  . Bowel obstruction ([place])   . Neuromuscular disorder (HCC)     neuropathy  . Arthritis   . Spinal stenosis   . Scoliosis   . Cataract   . GERD (gastroesophageal reflux disease)   . Osteoporosis    Past Surgical History  Procedure Laterality Date  . Sigmoid resection / rectopexy    . Colonoscopy      2 yrs  ago  . Eye surgery      cataracts bilat  . Joint replacement      bilat shoulders  . Fracture surgery      thoracic spine  . Abdominal hysterectomy  1972    partial  . Spinal cord stimulator insertion  11/18/2011    Procedure: LUMBAR SPINAL CORD STIMULATOR INSERTION;  Surgeon: Dahlia Bailiff, MD;  Location: [place];  Service: Orthopedics;  Laterality: N/A;  Thoracic 9 laminiotomy, SPINAL CORD STIMULATOR PLACEMENT thoracic 9- Thoracic 7   Current Outpatient Prescriptions on File Prior to Visit  Medication Sig Dispense Refill  . alendronate  (FOSAMAX) 70 MG tablet Take 1 tablet (70 mg total) by mouth every 7 (seven) days. Take with a full glass of water on an empty stomach. 12 tablet 4  . amoxicillin (AMOXIL) 500 MG capsule Take 500 mg by mouth 3 (three) times daily. Take 4 capsules 1 hour prior to dental appt    . aspirin 81 MG chewable tablet Chew 81 mg by mouth every other day.    . calcitonin, salmon, (MIACALCIN) 200 UNIT/ACT nasal spray Place 1 spray into alternate nostrils daily. 3.7 mL 12  . Calcium Carbonate-Vitamin D (CALCIUM + D) 600-200 MG-UNIT TABS Take 1 tablet by mouth daily.    . cetirizine (ZYRTEC) 10 MG tablet TAKE 1 TABLET DAILY 90 tablet 2  . cyanocobalamin 2000 MCG tablet Take 2,000 mcg by mouth daily.    . fentaNYL (DURAGESIC) 75 MCG/HR Place 1 patch (75 mcg total) onto the skin every 3 (three) days. 10 patch 0  . Fluticasone Furoate-Vilanterol (BREO ELLIPTA) 100-25 MCG/INH AEPB Inhale 1 puff into the lungs daily. 90 each 1  . losartan (COZAAR) 50 MG tablet TAKE 1 TABLET DAILY 90 tablet 2  . Melatonin 10 MG TABS Take 1 tablet by mouth at bedtime.    . Omega-3 Fatty Acids (FISH OIL) 1200 MG CPDR Take 4 capsules by mouth daily.    Marland Kitchen oxyCODONE-acetaminophen (PERCOCET) 10-325 MG tablet .5 - 1 tab po Q 6hours 90 tablet 0  . pantoprazole (PROTONIX) 40 MG tablet TAKE 1 TABLET DAILY 90 tablet 2  . Propylene Glycol (SYSTANE BALANCE OP) Apply 1 drop to eye as needed. For dry eyes    . ranitidine (ZANTAC) 150 MG tablet TAKE 1 TABLET TWICE A DAY 180 tablet 3  . sucralfate (CARAFATE) 1 g tablet Take 1 tablet (1 g total) by mouth 4 (four) times daily -  with meals and at bedtime. 450 tablet 3  . Triamcinolone Acetonide (TRIAMCINOLONE 0.1 % CREAM : EUCERIN) CREA Apply 1 application topically 2 (two) times daily as needed. 1 each 2  . UCERIS 9 MG TB24 Take 1 tablet by mouth daily.    Marland Kitchen XIIDRA 5 % SOLN Place 1 drop into both eyes 2 (two) times daily.      No current facility-administered medications on file prior to visit.    Allergies  Allergen Reactions  . Cymbalta [Duloxetine Hcl] Other (See Comments)    Makes me lethargic  . Lyrica [Pregabalin] Other (See Comments)    Makes me lethargic  . Ranitidine Diarrhea  . Celebrex [Celecoxib] Rash  . Naprosyn [Naproxen] Rash   Social History   Social History  . Marital Status: Married    Spouse Name: N/A  . Number of Children: 3  . Years of Education: N/A   Occupational History  . Not on file.   Social History Main Topics  . Smoking status: Former Smoker  Quit date: 10/11/1977  . Smokeless tobacco: Never Used  . Alcohol Use: No  . Drug Use: Yes    Special: Oxycodone  . Sexual Activity: Not on file   Other Topics Concern  . Not on file   Social History Narrative   Lives at home with husband.     Review of Systems  All other systems reviewed and are negative.      Objective:   Physical Exam  Constitutional: She appears well-developed and well-nourished.  Cardiovascular: Normal rate, regular rhythm and normal heart sounds.   No murmur heard. Pulmonary/Chest: Effort normal and breath sounds normal. No respiratory distress. She has no wheezes. She has no rales.  Abdominal: Soft. Bowel sounds are normal.  Vitals reviewed.         Assessment & Plan:  Compression fx, thoracic spine, sequela  Chronic back pain  Neuropathy (HCC)  Continue fentanyl 75 g every 72 hours. She will continue to use Percocet 10/325 1/2-1 tablet every 6 hours as needed for pain. She has tried and failed gabapentin, Lyrica, Cymbalta, and amitriptyline for neuropathic pain. The only other option would know to try would be trokendi off label.  At the present time she is not interested in trying this. We will discontinue calcitonin as she is afraid of the side effects. The dysphagia is not getting worse and therefore we will discontinue the Carafate. Since the dysphagia is not getting worse, I will continue to have her on Fosamax given the multiple vertebral  fractures she is having.

## 2016-04-09 ENCOUNTER — Telehealth: Payer: Self-pay | Admitting: Family Medicine

## 2016-04-09 DIAGNOSIS — G8929 Other chronic pain: Secondary | ICD-10-CM

## 2016-04-09 DIAGNOSIS — M549 Dorsalgia, unspecified: Secondary | ICD-10-CM

## 2016-04-09 NOTE — Telephone Encounter (Signed)
Ok to refill 

## 2016-04-09 NOTE — Telephone Encounter (Signed)
Patient needs rx for fentanyl and oxycodone  [phone]

## 2016-04-12 MED ORDER — OXYCODONE-ACETAMINOPHEN 10-325 MG PO TABS: ORAL_TABLET | ORAL | Status: DC

## 2016-04-12 MED ORDER — FENTANYL 75 MCG/HR TD PT72: 75.0000 ug | MEDICATED_PATCH | TRANSDERMAL | Status: DC

## 2016-04-12 NOTE — Telephone Encounter (Signed)
RX printed, left up front and patient aware to pick up  

## 2016-04-12 NOTE — Telephone Encounter (Signed)
ok 

## 2016-04-14 ENCOUNTER — Ambulatory Visit: Payer: Self-pay | Admitting: Family Medicine

## 2016-04-15 ENCOUNTER — Encounter: Payer: Self-pay | Admitting: Family Medicine

## 2016-04-15 ENCOUNTER — Ambulatory Visit (INDEPENDENT_AMBULATORY_CARE_PROVIDER_SITE_OTHER): Payer: Medicare Other | Admitting: Family Medicine

## 2016-04-15 ENCOUNTER — Other Ambulatory Visit: Payer: Self-pay | Admitting: Family Medicine

## 2016-04-15 VITALS — BP 168/90 | HR 80 | Temp 98.5°F | Resp 18 | Ht 59.0 in | Wt 152.0 lb

## 2016-04-15 DIAGNOSIS — G8929 Other chronic pain: Secondary | ICD-10-CM

## 2016-04-15 DIAGNOSIS — S22000S Wedge compression fracture of unspecified thoracic vertebra, sequela: Secondary | ICD-10-CM | POA: Diagnosis not present

## 2016-04-15 DIAGNOSIS — M549 Dorsalgia, unspecified: Secondary | ICD-10-CM | POA: Diagnosis not present

## 2016-04-15 DIAGNOSIS — M899 Disorder of bone, unspecified: Secondary | ICD-10-CM

## 2016-04-15 DIAGNOSIS — M898X9 Other specified disorders of bone, unspecified site: Secondary | ICD-10-CM

## 2016-04-15 LAB — CBC WITH DIFFERENTIAL/PLATELET
BASOS PCT: 0 %
Basophils Absolute: 0 cells/uL (ref 0–200)
EOS ABS: 152 {cells}/uL (ref 15–500)
Eosinophils Relative: 2 %
HCT: 37.2 % (ref 35.0–45.0)
Hemoglobin: 12.1 g/dL (ref 12.0–15.0)
LYMPHS PCT: 18 %
Lymphs Abs: 1368 cells/uL (ref 850–3900)
MCH: 31 pg (ref 27.0–33.0)
MCHC: 32.5 g/dL (ref 32.0–36.0)
MCV: 95.4 fL (ref 80.0–100.0)
MONOS PCT: 8 %
MPV: 9.3 fL (ref 7.5–12.5)
Monocytes Absolute: 608 cells/uL (ref 200–950)
NEUTROS PCT: 72 %
Neutro Abs: 5472 cells/uL (ref 1500–7800)
PLATELETS: 390 10*3/uL (ref 140–400)
RBC: 3.9 MIL/uL (ref 3.80–5.10)
RDW: 13.5 % (ref 11.0–15.0)
WBC: 7.6 10*3/uL (ref 3.8–10.8)

## 2016-04-15 NOTE — Progress Notes (Signed)
Subjective:    Patient ID: Brianna Jacobs, female    DOB: 07-21-1930, 82 y.o.   MRN: [id number]  HPI  01/05/16 Had MRI 2011 of lumbar spine:  T11-T12: Retropulsion produces mild central stenosis. Flattening the ventral aspect of the thoracic cord. T12-L1: Desiccated disc with broad-based posterior protrusion. Mild bilateral facet hypertrophy and ligamentum flavum redundancy without significant stenosis. L1-L2: Disc desiccation with broad-based posterior disc protrusion. Mild central stenosis. Mild bilateral facet hypertrophy. Foramina and lateral recesses patent. L2-L3: Severely degenerated and desiccated disc with degenerative endplate changes. Right greater than left facet hypertrophy. Central canal and lateral recesses patent. Moderate right foraminal stenosis associated with scoliosis and osteophytes. Right lateral disc protrusion and osteophyte could affect the exiting right L2 nerve root. Lateral protrusion and osteophyte are new compared to prior exam. L3-L4: Degenerated disc. Left eccentric broad-based posterior disc protrusion extending into the left neural foramen. Bilateral facet hypertrophy with moderate central stenosis. Left lateral recess stenosis. Moderate right foraminal stenosis with mild left foraminal stenosis. L4-L5: Severely degenerated and desiccated disc with endplate changes. Severe bilateral facet arthrosis and ligamentum flavum redundancy. Left foraminal disc protrusion and severe left foraminal stenosis with compression of the exiting left L4 nerve. Left greater than right bilateral subarticular lateral recess stenosis. Minimal right foraminal stenosis. Mild to moderate central stenosis. L5-S1: Grade 1 anterolisthesis with uncoverage of the disc and a broad-based protrusion. Bilateral lateral recess stenosis. Severe bilateral facet arthrosis with bilateral facet effusions. Mild central stenosis. Severe bilateral foraminal  stenosis with compression of both L5 nerve roots.  Patient has a long-standing history of lower back problems as evidenced in the MRI report from 2011 listed above. She also has a history of 2 separate vertebral fractures in the thoracic spine show normal x-ray of the thoracic spine in 2013. However this weekend, the patient stumbled and fell in a restaurant. Shortly thereafter, she developed severe sharp stabbing pain up and down the thoracic spine. The pain is primarily located in the paraspinal areas on either side of thoracic spine. She denies any new numbness or weakness in the right leg. However there is some new numbness and tingling located on the lateral aspect of the left thigh. She has chronic weakness in both legs and chronic neuropathy in both legs. There is no tenderness to palpation over the spinous processes of the thoracic spine or in the thoracic spine paraspinal muscles however she states the pain is most intense it has been in years.  At that time, my plan was: Differential diagnosis includes herniated disc in the thoracic spine after a fall versus vertebral fracture in the thoracic spine after a fall. I like the patient to go immediately for an x-ray of her thoracic spine. If there is a vertebral fracture, we can discuss possible kyphoplasty versus pain management. However if there is no evidence of a new vertebral fracture, I will treat the patient empirically as possibly having a herniated disc with a prednisone taper pack. I will await the results of the x-ray of the thoracic spine.  01/12/16 Patient is here today for follow-up. The pain in the center of her back is intensifying. It is 9 on a scale of 10. She is unable to walk without a walker. She has fallen 4 separate times due to the pain in her back and the worsening weakness in both legs. She also reports worsening numbness in both legs. The pain is primarily between the levels of T7 and T12. She is able to stand  from a seated  position with assistance. However she screams in pain when she does this. X-rays of the thoracic spine reveal degenerative disc disease, levoscoliosis, and stable old compression fractures with no acute changes. Prednisone taper pack was tried without relief.  At that time, my plan was: I'm concerned the patient may have spinal stenosis developing in the thoracic spine. She is unable to have an MRI of the thoracic spine due to the presence of a spinal cord stimulator. Therefore I'll schedule the patient for CT of the thoracic spine to characterize further. Meanwhile she can use fentanyl 25 g per hour every 72 hours and use oxycodone for breakthrough pain.  02/20/16 CT revealed: 1. New, from 2015, T7 compression fracture with minimal height loss and no retropulsion. An acute fracture line is not seen but given the history and subtle perispinal edema favor subacute timing. 2. Remote T9, T10, T12, and L1 compression fractures as described.  Patient underwent T7 kyphoplasty and vertebroplasty 4/25.  I also recommended fosamax.  Patient states the pain is worse. She now screams in pain whenever she moves to get out of a chair. She denies any paralysis in her legs. She does state that the numbness in her legs is getting worse. Weakness in her legs is not getting worse. She denies any bowel or bladder incontinence. She denies any saddle anesthesia. She denies any shortness of breath although she states it hurts to take a deep breath in. She also complains of pain and difficulty and dysphasia/trouble swallowing. She blames this on the addition of Fosamax.  At that time, my plan was: Very complicated situation. I explained to the patient I will focus on the pain first try to manage and control her pain and then see how many of her residual symptoms including dysphagia and trouble breathing persist. Increase fentanyl from 25 g per hour to 75 g per hour. Continue use oxycodone 10/325 one every 6 hours as needed.  Add calcitonin nasal spray daily for her acute thoracic compression fracture. Recheck in one week. If her pain is better and she continues to have dysphasia discontinue Fosamax and begin GI workup. I would replace Fosamax with prolia. I see no evidence of an infection, cauda equina syndrome, or pulmonary embolism  03/18/16 Patient presents today complaining of severe pain in her back. She does think the fentanyl is helping. She is using Percocet 3-4 times a day. She uses a half a tablet to a whole tablet each time. She never started taking calcitonin due to concern regarding the side effects. She also reports worsening neuropathy in her right leg. Burning and stinging pain will wake her up at night radiating from her right thigh down to her right foot. The weakness in her legs is not getting worse. She denies any symptoms of cauda equina syndrome.  At that  Time, my plan was: Continue fentanyl 75 g every 72 hours. She will continue to use Percocet 10/325 1/2-1 tablet every 6 hours as needed for pain. She has tried and failed gabapentin, Lyrica, Cymbalta, and amitriptyline for neuropathic pain. The only other option would know to try would be trokendi off label.  At the present time she is not interested in trying this. We will discontinue calcitonin as she is afraid of the side effects. The dysphagia is not getting worse and therefore we will discontinue the Carafate. Since the dysphagia is not getting worse, I will continue to have her on Fosamax given the multiple vertebral fractures she is  having.  04/15/16 Patient is here today complaining of severe mid back pain all throughout her thoracic spine. Patient reports severe sharp pain with rotation of her trunk, with flexion, with extension of her spine. She has a difficult time getting up from a seated position. She has a difficult time sitting down. She has difficult time riding in a car. Any movement causes her to scream in pain. Fentanyl patches and  Percocet every 6 hours is not providing her significant relief. She never started the calcitonin nasal spray. Past Medical History  Diagnosis Date  . Asthma     occassional usage of inhalers  . Depression   . ADD (attention deficit disorder)   . Hypertension   . Cancer [facility])     colon cancer, 16 yrs ago  . Bowel obstruction ([place])   . Neuromuscular disorder ([facility])     neuropathy  . Arthritis   . Spinal stenosis   . Scoliosis   . Cataract   . GERD (gastroesophageal reflux disease)   . Osteoporosis    Past Surgical History  Procedure Laterality Date  . Sigmoid resection / rectopexy    . Colonoscopy      2 yrs ago  . Eye surgery      cataracts bilat  . Joint replacement      bilat shoulders  . Fracture surgery      thoracic spine  . Abdominal hysterectomy  1972    partial  . Spinal cord stimulator insertion  11/18/2011    Procedure: LUMBAR SPINAL CORD STIMULATOR INSERTION;  Surgeon: Dahlia Bailiff, MD;  Location: [place];  Service: Orthopedics;  Laterality: N/A;  Thoracic 9 laminiotomy, SPINAL CORD STIMULATOR PLACEMENT thoracic 9- Thoracic 7   Current Outpatient Prescriptions on File Prior to Visit  Medication Sig Dispense Refill  . alendronate (FOSAMAX) 70 MG tablet Take 1 tablet (70 mg total) by mouth every 7 (seven) days. Take with a full glass of water on an empty stomach. 12 tablet 4  . aspirin 81 MG chewable tablet Chew 81 mg by mouth every other day.    . calcitonin, salmon, (MIACALCIN) 200 UNIT/ACT nasal spray Place 1 spray into alternate nostrils daily. 3.7 mL 12  . Calcium Carbonate-Vitamin D (CALCIUM + D) 600-200 MG-UNIT TABS Take 1 tablet by mouth daily.    . cetirizine (ZYRTEC) 10 MG tablet TAKE 1 TABLET DAILY 90 tablet 2  . cyanocobalamin 2000 MCG tablet Take 2,000 mcg by mouth daily.    . fentaNYL (DURAGESIC) 75 MCG/HR Place 1 patch (75 mcg total) onto the skin every 3 (three) days. 10 patch 0  . Fluticasone Furoate-Vilanterol (BREO ELLIPTA) 100-25 MCG/INH AEPB  Inhale 1 puff into the lungs daily. 90 each 1  . losartan (COZAAR) 50 MG tablet TAKE 1 TABLET DAILY 90 tablet 2  . Melatonin 10 MG TABS Take 1 tablet by mouth at bedtime.    . Omega-3 Fatty Acids (FISH OIL) 1200 MG CPDR Take 4 capsules by mouth daily.    Marland Kitchen oxyCODONE-acetaminophen (PERCOCET) 10-325 MG tablet .5 - 1 tab po Q 6hours 90 tablet 0  . pantoprazole (PROTONIX) 40 MG tablet TAKE 1 TABLET DAILY 90 tablet 2  . Propylene Glycol (SYSTANE BALANCE OP) Apply 1 drop to eye as needed. For dry eyes    . ranitidine (ZANTAC) 150 MG tablet TAKE 1 TABLET TWICE A DAY 180 tablet 3  . sucralfate (CARAFATE) 1 g tablet Take 1 tablet (1 g total) by mouth 4 (four) times daily -  with meals and at bedtime. 450 tablet 3  . Triamcinolone Acetonide (TRIAMCINOLONE 0.1 % CREAM : EUCERIN) CREA Apply 1 application topically 2 (two) times daily as needed. 1 each 2  . UCERIS 9 MG TB24 Take 1 tablet by mouth daily.    Marland Kitchen XIIDRA 5 % SOLN Place 1 drop into both eyes 2 (two) times daily.      No current facility-administered medications on file prior to visit.   Allergies  Allergen Reactions  . Cymbalta [Duloxetine Hcl] Other (See Comments)    Makes me lethargic  . Lyrica [Pregabalin] Other (See Comments)    Makes me lethargic  . Ranitidine Diarrhea  . Celebrex [Celecoxib] Rash  . Naprosyn [Naproxen] Rash   Social History   Social History  . Marital Status: Married    Spouse Name: N/A  . Number of Children: 3  . Years of Education: N/A   Occupational History  . Not on file.   Social History Main Topics  . Smoking status: Former Smoker    Quit date: 10/11/1977  . Smokeless tobacco: Never Used  . Alcohol Use: No  . Drug Use: Yes    Special: Oxycodone  . Sexual Activity: Not on file   Other Topics Concern  . Not on file   Social History Narrative   Lives at home with husband.     Review of Systems  All other systems reviewed and are negative.      Objective:   Physical Exam    Constitutional: She appears well-developed and well-nourished.  Cardiovascular: Normal rate, regular rhythm and normal heart sounds.   No murmur heard. Pulmonary/Chest: Effort normal and breath sounds normal. No respiratory distress. She has no wheezes. She has no rales.  Abdominal: Soft. Bowel sounds are normal.  Musculoskeletal:       Thoracic back: She exhibits decreased range of motion, tenderness, bony tenderness and pain.       Lumbar back: She exhibits decreased range of motion, tenderness, bony tenderness and pain.  Vitals reviewed.         Assessment & Plan:  Bone pain - Plan: COMPLETE METABOLIC PANEL WITH GFR, CBC with Differential/Platelet, Beta 2 microglobulin, serum, CANCELED: Multiple Myeloma Panel (SPEP&IFE w/QIG)  Chronic back pain - Plan: Ambulatory referral to Pain Clinic  Compression fx, thoracic spine, sequela - Plan: Ambulatory referral to Pain Clinic  Given her advanced age, I am hesitant to increase her narcotics higher due to potential side effects.  I will rule out other potential causes of back pain by obtaining an SPEP and UPEP although I believe her pain is most likely due to the compression fractures in her thoracic spine coupled with her lumbar degenerative disc disease and mild to moderate spinal stenosis.  Therefore I will consult the pain clinic to better try to manage her pain as I have been unsuccessful.

## 2016-04-16 LAB — COMPLETE METABOLIC PANEL WITH GFR
ALT: 24 U/L (ref 6–29)
AST: 23 U/L (ref 10–35)
Albumin: 3.9 g/dL (ref 3.6–5.1)
Alkaline Phosphatase: 46 U/L (ref 33–130)
BILIRUBIN TOTAL: 0.4 mg/dL (ref 0.2–1.2)
BUN: 10 mg/dL (ref 7–25)
CALCIUM: 9.2 mg/dL (ref 8.6–10.4)
CO2: 22 mmol/L (ref 20–31)
CREATININE: 0.76 mg/dL (ref 0.60–0.88)
Chloride: 100 mmol/L (ref 98–110)
GFR, EST AFRICAN AMERICAN: 83 mL/min (ref >=60)
GFR, Est Non African American: 72 mL/min (ref >=60)
Glucose, Bld: 85 mg/dL (ref 70–99)
Potassium: 3.9 mmol/L (ref 3.5–5.3)
SODIUM: 135 mmol/L (ref 135–146)
TOTAL PROTEIN: 5.9 g/dL — AB (ref 6.1–8.1)

## 2016-04-19 ENCOUNTER — Ambulatory Visit: Payer: Self-pay | Admitting: Family Medicine

## 2016-04-19 LAB — CP PROTEIN ELECTROPHORESIS, W/RFLX
Albumin: 3.9 g/dL (ref 3.8–4.8)
Alpha 1: 0.4 g/dL — ABNORMAL HIGH (ref 0.2–0.3)
Alpha 2: 0.9 g/dL (ref 0.5–0.9)
BETA 2: 0.2 g/dL (ref 0.2–0.5)
Beta 1: 0.4 g/dL (ref 0.4–0.6)
GAMMA: 0.6 g/dL — AB (ref 0.8–1.7)
TOTAL PROTEIN, SERUM ELECTROPHOR: 6.4 g/dL (ref 6.1–8.1)

## 2016-04-19 LAB — IFE INTERPRETATION

## 2016-04-21 LAB — IGG, IGA, IGM
IGA: 83 mg/dL (ref 81–463)
IGG (IMMUNOGLOBIN G), SERUM: 1121 mg/dL (ref 694–1618)
IgM, Serum: 210 mg/dL (ref 48–271)

## 2016-05-05 ENCOUNTER — Encounter: Payer: Self-pay | Admitting: Family Medicine

## 2016-05-18 ENCOUNTER — Encounter: Payer: Self-pay | Admitting: Family Medicine

## 2016-05-18 DIAGNOSIS — M549 Dorsalgia, unspecified: Secondary | ICD-10-CM

## 2016-05-18 DIAGNOSIS — G8929 Other chronic pain: Secondary | ICD-10-CM

## 2016-05-19 MED ORDER — OXYCODONE-ACETAMINOPHEN 10-325 MG PO TABS: ORAL_TABLET | ORAL | 0 refills | Status: DC

## 2016-05-19 MED ORDER — FENTANYL 75 MCG/HR TD PT72: 75.0000 ug | MEDICATED_PATCH | TRANSDERMAL | 0 refills | Status: DC

## 2016-05-19 NOTE — Telephone Encounter (Signed)
OK to refill her Percocet and Fentanyl patches?

## 2016-05-24 ENCOUNTER — Encounter: Payer: Self-pay | Admitting: Family Medicine

## 2016-05-24 ENCOUNTER — Ambulatory Visit (INDEPENDENT_AMBULATORY_CARE_PROVIDER_SITE_OTHER): Payer: Medicare Other | Admitting: Family Medicine

## 2016-05-24 VITALS — BP 156/84 | HR 88 | Temp 98.3°F | Resp 18 | Ht <= 58 in | Wt 142.0 lb

## 2016-05-24 DIAGNOSIS — M899 Disorder of bone, unspecified: Secondary | ICD-10-CM

## 2016-05-24 DIAGNOSIS — S22000S Wedge compression fracture of unspecified thoracic vertebra, sequela: Secondary | ICD-10-CM | POA: Diagnosis not present

## 2016-05-24 DIAGNOSIS — I10 Essential (primary) hypertension: Secondary | ICD-10-CM | POA: Diagnosis not present

## 2016-05-24 DIAGNOSIS — M48061 Spinal stenosis, lumbar region without neurogenic claudication: Secondary | ICD-10-CM

## 2016-05-24 DIAGNOSIS — M4806 Spinal stenosis, lumbar region: Secondary | ICD-10-CM

## 2016-05-24 DIAGNOSIS — M549 Dorsalgia, unspecified: Secondary | ICD-10-CM | POA: Diagnosis not present

## 2016-05-24 DIAGNOSIS — M5136 Other intervertebral disc degeneration, lumbar region: Secondary | ICD-10-CM | POA: Diagnosis not present

## 2016-05-24 DIAGNOSIS — M898X9 Other specified disorders of bone, unspecified site: Secondary | ICD-10-CM

## 2016-05-25 NOTE — Progress Notes (Signed)
Subjective:    Patient ID: Brianna Jacobs, female    DOB: 02-27-1930, 82 y.o.   MRN: [id number]  HPI 01/05/16 Had MRI 2011 of lumbar spine:  T11-T12: Retropulsion produces mild central stenosis. Flattening the ventral aspect of the thoracic cord. T12-L1: Desiccated disc with broad-based posterior protrusion. Mild bilateral facet hypertrophy and ligamentum flavum redundancy without significant stenosis. L1-L2: Disc desiccation with broad-based posterior disc protrusion. Mild central stenosis. Mild bilateral facet hypertrophy. Foramina and lateral recesses patent. L2-L3: Severely degenerated and desiccated disc with degenerative endplate changes. Right greater than left facet hypertrophy. Central canal and lateral recesses patent. Moderate right foraminal stenosis associated with scoliosis and osteophytes. Right lateral disc protrusion and osteophyte could affect the exiting right L2 nerve root. Lateral protrusion and osteophyte are new compared to prior exam. L3-L4: Degenerated disc. Left eccentric broad-based posterior disc protrusion extending into the left neural foramen. Bilateral facet hypertrophy with moderate central stenosis. Left lateral recess stenosis. Moderate right foraminal stenosis with mild left foraminal stenosis. L4-L5: Severely degenerated and desiccated disc with endplate changes. Severe bilateral facet arthrosis and ligamentum flavum redundancy. Left foraminal disc protrusion and severe left foraminal stenosis with compression of the exiting left L4 nerve. Left greater than right bilateral subarticular lateral recess stenosis. Minimal right foraminal stenosis. Mild to moderate central stenosis. L5-S1: Grade 1 anterolisthesis with uncoverage of the disc and a broad-based protrusion. Bilateral lateral recess stenosis. Severe bilateral facet arthrosis with bilateral facet effusions. Mild central stenosis. Severe bilateral foraminal  stenosis with compression of both L5 nerve roots.  Patient has a long-standing history of lower back problems as evidenced in the MRI report from 2011 listed above. She also has a history of 2 separate vertebral fractures in the thoracic spine show normal x-ray of the thoracic spine in 2013. However this weekend, the patient stumbled and fell in a restaurant. Shortly thereafter, she developed severe sharp stabbing pain up and down the thoracic spine. The pain is primarily located in the paraspinal areas on either side of thoracic spine. She denies any new numbness or weakness in the right leg. However there is some new numbness and tingling located on the lateral aspect of the left thigh. She has chronic weakness in both legs and chronic neuropathy in both legs. There is no tenderness to palpation over the spinous processes of the thoracic spine or in the thoracic spine paraspinal muscles however she states the pain is most intense it has been in years.  At that time, my plan was: Differential diagnosis includes herniated disc in the thoracic spine after a fall versus vertebral fracture in the thoracic spine after a fall. I like the patient to go immediately for an x-ray of her thoracic spine. If there is a vertebral fracture, we can discuss possible kyphoplasty versus pain management. However if there is no evidence of a new vertebral fracture, I will treat the patient empirically as possibly having a herniated disc with a prednisone taper pack. I will await the results of the x-ray of the thoracic spine.  01/12/16 Patient is here today for follow-up. The pain in the center of her back is intensifying. It is 9 on a scale of 10. She is unable to walk without a walker. She has fallen 4 separate times due to the pain in her back and the worsening weakness in both legs. She also reports worsening numbness in both legs. The pain is primarily between the levels of T7 and T12. She is able to stand from  a seated  position with assistance. However she screams in pain when she does this. X-rays of the thoracic spine reveal degenerative disc disease, levoscoliosis, and stable old compression fractures with no acute changes. Prednisone taper pack was tried without relief.  At that time, my plan was: I'm concerned the patient may have spinal stenosis developing in the thoracic spine. She is unable to have an MRI of the thoracic spine due to the presence of a spinal cord stimulator. Therefore I'll schedule the patient for CT of the thoracic spine to characterize further. Meanwhile she can use fentanyl 25 g per hour every 72 hours and use oxycodone for breakthrough pain.  02/20/16 CT revealed: 1. New, from 2015, T7 compression fracture with minimal height loss and no retropulsion. An acute fracture line is not seen but given the history and subtle perispinal edema favor subacute timing. 2. Remote T9, T10, T12, and L1 compression fractures as described.  Patient underwent T7 kyphoplasty and vertebroplasty 4/25.  I also recommended fosamax.  Patient states the pain is worse. She now screams in pain whenever she moves to get out of a chair. She denies any paralysis in her legs. She does state that the numbness in her legs is getting worse. Weakness in her legs is not getting worse. She denies any bowel or bladder incontinence. She denies any saddle anesthesia. She denies any shortness of breath although she states it hurts to take a deep breath in. She also complains of pain and difficulty and dysphasia/trouble swallowing. She blames this on the addition of Fosamax.  At that time, my plan was: Very complicated situation. I explained to the patient I will focus on the pain first try to manage and control her pain and then see how many of her residual symptoms including dysphagia and trouble breathing persist. Increase fentanyl from 25 g per hour to 75 g per hour. Continue use oxycodone 10/325 one every 6 hours as needed.  Add calcitonin nasal spray daily for her acute thoracic compression fracture. Recheck in one week. If her pain is better and she continues to have dysphasia discontinue Fosamax and begin GI workup. I would replace Fosamax with prolia. I see no evidence of an infection, cauda equina syndrome, or pulmonary embolism  03/18/16 Patient presents today complaining of severe pain in her back. She does think the fentanyl is helping. She is using Percocet 3-4 times a day. She uses a half a tablet to a whole tablet each time. She never started taking calcitonin due to concern regarding the side effects. She also reports worsening neuropathy in her right leg. Burning and stinging pain will wake her up at night radiating from her right thigh down to her right foot. The weakness in her legs is not getting worse. She denies any symptoms of cauda equina syndrome.  At that  Time, my plan was: Continue fentanyl 75 g every 72 hours. She will continue to use Percocet 10/325 1/2-1 tablet every 6 hours as needed for pain. She has tried and failed gabapentin, Lyrica, Cymbalta, and amitriptyline for neuropathic pain. The only other option would know to try would be trokendi off label.  At the present time she is not interested in trying this. We will discontinue calcitonin as she is afraid of the side effects. The dysphagia is not getting worse and therefore we will discontinue the Carafate. Since the dysphagia is not getting worse, I will continue to have her on Fosamax given the multiple vertebral fractures she is having.  04/15/16 Patient is here today complaining of severe mid back pain all throughout her thoracic spine. Patient reports severe sharp pain with rotation of her trunk, with flexion, with extension of her spine. She has a difficult time getting up from a seated position. She has a difficult time sitting down. She has difficult time riding in a car. Any movement causes her to scream in pain. Fentanyl patches and  Percocet every 6 hours is not providing her significant relief. She never started the calcitonin nasal spray.  AT that time, my plan was: Given her advanced age, I am hesitant to increase her narcotics higher due to potential side effects.  I will rule out other potential causes of back pain by obtaining an SPEP and UPEP although I believe her pain is most likely due to the compression fractures in her thoracic spine coupled with her lumbar degenerative disc disease and mild to moderate spinal stenosis.  Therefore I will consult the pain clinic to better try to manage her pain as I have been unsuccessful.  05/25/16 Patient has yet to see the pain clinic. Fortunately however it seems like her pain is improving. She is still not using calcitonin. She is here today because she would like to go over all the medication she is taking. I reviewed with her the med list in detail. I explained to her the calcitonin is simply for bone pain. She can certainly stop that and discard the medication if she does not feel it's beneficial. I explained to the patient that Carafate is intended to treat ulcers and gastritis. At the present time she denies any symptoms of these. She is already on pantoprazole as well as Zantac which seemed to control her reflux. However she still complains of dysphasia. She also complains of food sticking in her upper esophagus at times. I recommended that she follow-up with her gastroenterologist as she may need an EGD and dilatation which she has had in the past.  Her blood pressure today is elevated. She is on losartan 50 mg by mouth daily she states that this works well but she has not been checking her blood pressure at home. Most recent labs are listed below: Office Visit on 04/15/2016  Component Date Value Ref Range Status  . Sodium 04/16/2016 135  135 - 146 mmol/L Final  . Potassium 04/16/2016 3.9  3.5 - 5.3 mmol/L Final  . Chloride 04/16/2016 100  98 - 110 mmol/L Final  . CO2 04/16/2016  22  20 - 31 mmol/L Final  . Glucose, Bld 04/16/2016 85  70 - 99 mg/dL Final  . BUN 04/16/2016 10  7 - 25 mg/dL Final  . Creat 04/16/2016 0.76  0.60 - 0.88 mg/dL Final   Comment:   For patients > or = 80 years of age: The upper reference limit for Creatinine is approximately 13% higher for people identified as African-American.     . Total Bilirubin 04/16/2016 0.4  0.2 - 1.2 mg/dL Final  . Alkaline Phosphatase 04/16/2016 46  33 - 130 U/L Final  . AST 04/16/2016 23  10 - 35 U/L Final  . ALT 04/16/2016 24  6 - 29 U/L Final  . Total Protein 04/16/2016 5.9* 6.1 - 8.1 g/dL Final  . Albumin 04/16/2016 3.9  3.6 - 5.1 g/dL Final  . Calcium 04/16/2016 9.2  8.6 - 10.4 mg/dL Final  . GFR, Est African American 04/16/2016 83  >=60 mL/min Final  . GFR, Est Non African American 04/16/2016 72  >=60  mL/min Final  . WBC 04/15/2016 7.6  3.8 - 10.8 K/uL Final  . RBC 04/15/2016 3.90  3.80 - 5.10 MIL/uL Final  . Hemoglobin 04/15/2016 12.1  12.0 - 15.0 g/dL Final  . HCT 04/15/2016 37.2  35.0 - 45.0 % Final  . MCV 04/15/2016 95.4  80.0 - 100.0 fL Final  . MCH 04/15/2016 31.0  27.0 - 33.0 pg Final  . MCHC 04/15/2016 32.5  32.0 - 36.0 g/dL Final  . RDW 04/15/2016 13.5  11.0 - 15.0 % Final  . Platelets 04/15/2016 390  140 - 400 K/uL Final  . MPV 04/15/2016 9.3  7.5 - 12.5 fL Final  . Neutro Abs 04/15/2016 5472  1,500 - 7,800 cells/uL Final  . Lymphs Abs 04/15/2016 1368  850 - 3,900 cells/uL Final  . Monocytes Absolute 04/15/2016 608  200 - 950 cells/uL Final  . Eosinophils Absolute 04/15/2016 152  15 - 500 cells/uL Final  . Basophils Absolute 04/15/2016 0  0 - 200 cells/uL Final  . Neutrophils Relative % 04/15/2016 72  % Final  . Lymphocytes Relative 04/15/2016 18  % Final  . Monocytes Relative 04/15/2016 8  % Final  . Eosinophils Relative 04/15/2016 2  % Final  . Basophils Relative 04/15/2016 0  % Final  . Smear Review 04/15/2016 Criteria for review not met   Final    Past Medical History:  Diagnosis  Date  . ADD (attention deficit disorder)   . Arthritis   . Asthma    occassional usage of inhalers  . Bowel obstruction ([place])   . Cancer [facility])    colon cancer, 16 yrs ago  . Cataract   . Depression   . GERD (gastroesophageal reflux disease)   . Hypertension   . Neuromuscular disorder ([facility])    neuropathy  . Osteoporosis   . Scoliosis   . Spinal stenosis    Past Surgical History:  Procedure Laterality Date  . ABDOMINAL HYSTERECTOMY  1972   partial  . COLONOSCOPY     2 yrs ago  . EYE SURGERY     cataracts bilat  . FRACTURE SURGERY     thoracic spine  . JOINT REPLACEMENT     bilat shoulders  . SIGMOID RESECTION / RECTOPEXY    . SPINAL CORD STIMULATOR INSERTION  11/18/2011   Procedure: LUMBAR SPINAL CORD STIMULATOR INSERTION;  Surgeon: Dahlia Bailiff, MD;  Location: [place];  Service: Orthopedics;  Laterality: N/A;  Thoracic 9 laminiotomy, SPINAL CORD STIMULATOR PLACEMENT thoracic 9- Thoracic 7   Current Outpatient Prescriptions on File Prior to Visit  Medication Sig Dispense Refill  . alendronate (FOSAMAX) 70 MG tablet Take 1 tablet (70 mg total) by mouth every 7 (seven) days. Take with a full glass of water on an empty stomach. 12 tablet 4  . aspirin 81 MG chewable tablet Chew 81 mg by mouth every other day.    . calcitonin, salmon, (MIACALCIN) 200 UNIT/ACT nasal spray Place 1 spray into alternate nostrils daily. 3.7 mL 12  . Calcium Carbonate-Vitamin D (CALCIUM + D) 600-200 MG-UNIT TABS Take 1 tablet by mouth daily.    . cetirizine (ZYRTEC) 10 MG tablet TAKE 1 TABLET DAILY 90 tablet 2  . cyanocobalamin 2000 MCG tablet Take 2,000 mcg by mouth daily.    . fentaNYL (DURAGESIC) 75 MCG/HR Place 1 patch (75 mcg total) onto the skin every 3 (three) days. 10 patch 0  . Fluticasone Furoate-Vilanterol (BREO ELLIPTA) 100-25 MCG/INH AEPB Inhale 1 puff into the lungs daily.  90 each 1  . losartan (COZAAR) 50 MG tablet TAKE 1 TABLET DAILY 90 tablet 2  . Melatonin 10 MG TABS Take 1 tablet by  mouth at bedtime.    . Omega-3 Fatty Acids (FISH OIL) 1200 MG CPDR Take 4 capsules by mouth daily.    Marland Kitchen oxyCODONE-acetaminophen (PERCOCET) 10-325 MG tablet .5 - 1 tab po Q 6hours 90 tablet 0  . pantoprazole (PROTONIX) 40 MG tablet TAKE 1 TABLET DAILY 90 tablet 2  . Propylene Glycol (SYSTANE BALANCE OP) Apply 1 drop to eye as needed. For dry eyes    . ranitidine (ZANTAC) 150 MG tablet TAKE 1 TABLET TWICE A DAY 180 tablet 3  . sucralfate (CARAFATE) 1 g tablet Take 1 tablet (1 g total) by mouth 4 (four) times daily -  with meals and at bedtime. 450 tablet 3  . Triamcinolone Acetonide (TRIAMCINOLONE 0.1 % CREAM : EUCERIN) CREA Apply 1 application topically 2 (two) times daily as needed. 1 each 2  . UCERIS 9 MG TB24 Take 1 tablet by mouth daily.    Marland Kitchen XIIDRA 5 % SOLN Place 1 drop into both eyes 2 (two) times daily.      No current facility-administered medications on file prior to visit.    Allergies  Allergen Reactions  . Cymbalta [Duloxetine Hcl] Other (See Comments)    Makes me lethargic  . Lyrica [Pregabalin] Other (See Comments)    Makes me lethargic  . Ranitidine Diarrhea  . Celebrex [Celecoxib] Rash  . Naprosyn [Naproxen] Rash   Social History   Social History  . Marital status: Married    Spouse name: N/A  . Number of children: 3  . Years of education: N/A   Occupational History  . Not on file.   Social History Main Topics  . Smoking status: Former Smoker    Quit date: 10/11/1977  . Smokeless tobacco: Never Used  . Alcohol use No  . Drug use:     Types: Oxycodone  . Sexual activity: Not on file   Other Topics Concern  . Not on file   Social History Narrative   Lives at home with husband.     Review of Systems  All other systems reviewed and are negative.      Objective:   Physical Exam  Constitutional: She appears well-developed and well-nourished.  Cardiovascular: Normal rate, regular rhythm and normal heart sounds.   No murmur heard. Pulmonary/Chest:  Effort normal and breath sounds normal. No respiratory distress. She has no wheezes. She has no rales.  Abdominal: Soft. Bowel sounds are normal.  Musculoskeletal:       Thoracic back: She exhibits decreased range of motion, tenderness, bony tenderness and pain.       Lumbar back: She exhibits decreased range of motion, tenderness, bony tenderness and pain.  Vitals reviewed.         Assessment & Plan:  Mid-back pain, acute  Bone pain  Spinal stenosis of lumbar region  DDD (degenerative disc disease), lumbar  Compression fx, thoracic spine, sequela  Benign essential HTN Patient is scheduling herself to meet with a neurosurgeon to discuss other options for pain control regarding her mid back pain. Fortunately this is improving. I recommended she discontinue calcitonin as she has been sporadic and using the medication. Also recommended that she discontinue Carafate as her symptoms now do not suggest gastritis or an ulcer. However she is having dysphasia so I recommended that she call her gastroenterologist and arrange a follow-up to  discuss possible repeat EGD. Her blood pressure today is elevated. I recommended that she check her blood pressure frequently over the next week and then provide the values to me to review. If consistently greater than 140/90, I would increase her losartan to 100 mg a day

## 2016-06-01 ENCOUNTER — Other Ambulatory Visit: Payer: Self-pay | Admitting: Family Medicine

## 2016-06-01 DIAGNOSIS — G8929 Other chronic pain: Secondary | ICD-10-CM

## 2016-06-01 DIAGNOSIS — M549 Dorsalgia, unspecified: Secondary | ICD-10-CM

## 2016-06-01 MED ORDER — CALCITONIN (SALMON) 200 UNIT/ACT NA SOLN: 1.0000 | Freq: Every day | NASAL | 3 refills | Status: DC

## 2016-06-05 ENCOUNTER — Other Ambulatory Visit: Payer: Self-pay | Admitting: Family Medicine

## 2016-06-23 ENCOUNTER — Encounter: Payer: Self-pay | Admitting: Family Medicine

## 2016-06-23 DIAGNOSIS — M810 Age-related osteoporosis without current pathological fracture: Secondary | ICD-10-CM | POA: Diagnosis not present

## 2016-06-23 DIAGNOSIS — M546 Pain in thoracic spine: Secondary | ICD-10-CM | POA: Diagnosis not present

## 2016-06-23 DIAGNOSIS — Z9889 Other specified postprocedural states: Secondary | ICD-10-CM | POA: Diagnosis not present

## 2016-06-23 DIAGNOSIS — M549 Dorsalgia, unspecified: Secondary | ICD-10-CM

## 2016-06-23 DIAGNOSIS — G8929 Other chronic pain: Secondary | ICD-10-CM

## 2016-06-24 MED ORDER — FENTANYL 75 MCG/HR TD PT72: 75.0000 ug | MEDICATED_PATCH | TRANSDERMAL | 0 refills | Status: DC

## 2016-06-26 ENCOUNTER — Other Ambulatory Visit: Payer: Self-pay | Admitting: Family Medicine

## 2016-06-29 DIAGNOSIS — R159 Full incontinence of feces: Secondary | ICD-10-CM | POA: Diagnosis not present

## 2016-06-29 DIAGNOSIS — R151 Fecal smearing: Secondary | ICD-10-CM | POA: Diagnosis not present

## 2016-06-29 DIAGNOSIS — K5904 Chronic idiopathic constipation: Secondary | ICD-10-CM | POA: Diagnosis not present

## 2016-06-29 DIAGNOSIS — R152 Fecal urgency: Secondary | ICD-10-CM | POA: Diagnosis not present

## 2016-06-29 DIAGNOSIS — K5641 Fecal impaction: Secondary | ICD-10-CM | POA: Diagnosis not present

## 2016-07-13 ENCOUNTER — Ambulatory Visit (INDEPENDENT_AMBULATORY_CARE_PROVIDER_SITE_OTHER): Payer: Medicare Other | Admitting: Family Medicine

## 2016-07-13 ENCOUNTER — Encounter: Payer: Self-pay | Admitting: Family Medicine

## 2016-07-13 VITALS — BP 150/68 | HR 74 | Temp 98.1°F | Resp 18 | Ht <= 58 in | Wt 139.0 lb

## 2016-07-13 DIAGNOSIS — Z23 Encounter for immunization: Secondary | ICD-10-CM | POA: Diagnosis not present

## 2016-07-13 DIAGNOSIS — I872 Venous insufficiency (chronic) (peripheral): Secondary | ICD-10-CM

## 2016-07-13 DIAGNOSIS — I10 Essential (primary) hypertension: Secondary | ICD-10-CM | POA: Diagnosis not present

## 2016-07-13 MED ORDER — SUCRALFATE 1 G PO TABS: 1.0000 g | ORAL_TABLET | Freq: Three times a day (TID) | ORAL | 3 refills | Status: DC

## 2016-07-13 NOTE — Addendum Note (Signed)
Addended by: Shary Decamp B on: 07/13/2016 04:53 PM   Modules accepted: Orders

## 2016-07-13 NOTE — Progress Notes (Signed)
Subjective:    Patient ID: Brianna Jacobs, female    DOB: [birth year], 82 y.o.   MRN: [id number]  HPI Patient complains of swelling in both feet. She has trace bipedal edema. She has numerous visible large varicose veins on the dorsums of both legs along with many spider veins. She denies any shortness of breath or chest pain or orthopnea. Her blood pressure at home has generally been between 120 and 140/60-80. She does have an occasional blood pressure this elevated such as it is here today in clinic.  Past Medical History:  Diagnosis Date  . ADD (attention deficit disorder)   . Arthritis   . Asthma    occassional usage of inhalers  . Bowel obstruction   . Cancer [facility])    colon cancer, 16 yrs ago  . Cataract   . Depression   . GERD (gastroesophageal reflux disease)   . Hypertension   . Neuromuscular disorder ([facility])    neuropathy  . Osteoporosis   . Scoliosis   . Spinal stenosis    Past Surgical History:  Procedure Laterality Date  . ABDOMINAL HYSTERECTOMY  1972   partial  . COLONOSCOPY     2 yrs ago  . EYE SURGERY     cataracts bilat  . FRACTURE SURGERY     thoracic spine  . JOINT REPLACEMENT     bilat shoulders  . SIGMOID RESECTION / RECTOPEXY    . SPINAL CORD STIMULATOR INSERTION  11/18/2011   Procedure: LUMBAR SPINAL CORD STIMULATOR INSERTION;  Surgeon: Dahlia Bailiff, MD;  Location: [place];  Service: Orthopedics;  Laterality: N/A;  Thoracic 9 laminiotomy, SPINAL CORD STIMULATOR PLACEMENT thoracic 9- Thoracic 7   Current Outpatient Prescriptions on File Prior to Visit  Medication Sig Dispense Refill  . alendronate (FOSAMAX) 70 MG tablet Take 1 tablet (70 mg total) by mouth every 7 (seven) days. Take with a full glass of water on an empty stomach. 12 tablet 4  . aspirin 81 MG chewable tablet Chew 81 mg by mouth every other day.    Marland Kitchen BREO ELLIPTA 100-25 MCG/INH AEPB USE 1 INHALATION DAILY 180 each 1  . calcitonin, salmon, (MIACALCIN) 200 UNIT/ACT nasal spray Place 1  spray into alternate nostrils daily. 11.1 mL 3  . Calcium Carbonate-Vitamin D (CALCIUM + D) 600-200 MG-UNIT TABS Take 1 tablet by mouth daily.    . cetirizine (ZYRTEC) 10 MG tablet TAKE 1 TABLET DAILY 90 tablet 2  . cyanocobalamin 2000 MCG tablet Take 2,000 mcg by mouth daily.    . fentaNYL (DURAGESIC) 75 MCG/HR Place 1 patch (75 mcg total) onto the skin every 3 (three) days. 10 patch 0  . losartan (COZAAR) 50 MG tablet TAKE 1 TABLET DAILY 90 tablet 2  . Melatonin 10 MG TABS Take 1 tablet by mouth at bedtime.    . Omega-3 Fatty Acids (FISH OIL) 1200 MG CPDR Take 4 capsules by mouth daily.    Marland Kitchen oxyCODONE-acetaminophen (PERCOCET) 10-325 MG tablet .5 - 1 tab po Q 6hours 90 tablet 0  . pantoprazole (PROTONIX) 40 MG tablet TAKE 1 TABLET DAILY 90 tablet 2  . Propylene Glycol (SYSTANE BALANCE OP) Apply 1 drop to eye as needed. For dry eyes    . ranitidine (ZANTAC) 150 MG tablet TAKE 1 TABLET TWICE A DAY 180 tablet 3  . Triamcinolone Acetonide (TRIAMCINOLONE 0.1 % CREAM : EUCERIN) CREA Apply 1 application topically 2 (two) times daily as needed. 1 each 2  . XIIDRA 5 %  SOLN Place 1 drop into both eyes 2 (two) times daily.     Marland Kitchen UCERIS 9 MG TB24 Take 1 tablet by mouth daily.     No current facility-administered medications on file prior to visit.    Allergies  Allergen Reactions  . Cymbalta [Duloxetine Hcl] Other (See Comments)    Makes me lethargic  . Lyrica [Pregabalin] Other (See Comments)    Makes me lethargic  . Ranitidine Diarrhea  . Celebrex [Celecoxib] Rash  . Naprosyn [Naproxen] Rash   Social History   Social History  . Marital status: Married    Spouse name: N/A  . Number of children: 3  . Years of education: N/A   Occupational History  . Not on file.   Social History Main Topics  . Smoking status: Former Smoker    Quit date: 10/11/1977  . Smokeless tobacco: Never Used  . Alcohol use No  . Drug use:     Types: Oxycodone  . Sexual activity: Not on file   Other Topics  Concern  . Not on file   Social History Narrative   Lives at home with husband.     Review of Systems  All other systems reviewed and are negative.      Objective:   Physical Exam  Cardiovascular: Normal rate, regular rhythm and normal heart sounds.   Pulmonary/Chest: Effort normal and breath sounds normal. No respiratory distress. She has no wheezes. She has no rales.  Abdominal: Soft. Bowel sounds are normal.  Musculoskeletal: She exhibits edema.  Vitals reviewed.         Assessment & Plan:  Benign essential HTN  Chronic venous insufficiency  I will have the patient check her blood pressure frequently over the next week and report the values to me. If the majority of blood pressures are greater than 140/90, I will consider adding to her blood pressure medication. I explained to the patient that she has chronic venous insufficiency and I recommended knee-high compression stockings to manage this

## 2016-07-26 ENCOUNTER — Encounter: Payer: Self-pay | Admitting: Family Medicine

## 2016-08-10 ENCOUNTER — Other Ambulatory Visit: Payer: Self-pay | Admitting: Family Medicine

## 2016-08-11 ENCOUNTER — Encounter: Payer: Self-pay | Admitting: Family Medicine

## 2016-08-11 DIAGNOSIS — M549 Dorsalgia, unspecified: Secondary | ICD-10-CM

## 2016-08-12 ENCOUNTER — Telehealth: Payer: Self-pay | Admitting: *Deleted

## 2016-08-12 MED ORDER — FENTANYL 75 MCG/HR TD PT72: 75.0000 ug | MEDICATED_PATCH | TRANSDERMAL | 0 refills | Status: DC

## 2016-08-12 NOTE — Telephone Encounter (Addendum)
Patient in office with concerns for R foot pain and dependent edema to BLE.   States that her R foot has been hurting x2 weeks. States that pain begins in toes and then moves up foot to ankle. Reports more pain to foot if she turns foot inward (suppinate). Noted brownish red discoloration to top of outer R foot. Discoloration blanches with slight pressure. Denies any known injury. Skin noted to be warm and dry. Patient denies pain with palpation. Capillary refill <3 seconds to nail beds. Pedal pulses present bilaterally. Patient ROM is not diminished.   Patient also has concerns about edema. Noted 1+ nonpitting edema to BLE. Patient states that edema resolves while she is resting and elevating her legs. States that if she is more active, she notes swelling.   Appointment scheduled for 08/13/2016.

## 2016-08-13 ENCOUNTER — Ambulatory Visit
Admission: RE | Admit: 2016-08-13 | Discharge: 2016-08-13 | Disposition: A | Discharge status: Home / Self Care | Payer: Medicare Other | Source: Ambulatory Visit | Attending: Family Medicine | Admitting: Family Medicine

## 2016-08-13 ENCOUNTER — Ambulatory Visit (INDEPENDENT_AMBULATORY_CARE_PROVIDER_SITE_OTHER): Payer: Medicare Other | Admitting: Family Medicine

## 2016-08-13 ENCOUNTER — Encounter: Payer: Self-pay | Admitting: Family Medicine

## 2016-08-13 VITALS — BP 150/80 | HR 84 | Temp 98.9°F | Resp 16 | Ht <= 58 in | Wt 133.0 lb

## 2016-08-13 DIAGNOSIS — S93601A Unspecified sprain of right foot, initial encounter: Secondary | ICD-10-CM | POA: Diagnosis not present

## 2016-08-13 DIAGNOSIS — M19071 Primary osteoarthritis, right ankle and foot: Secondary | ICD-10-CM | POA: Diagnosis not present

## 2016-08-13 MED ORDER — OXYCODONE-ACETAMINOPHEN 10-325 MG PO TABS: ORAL_TABLET | ORAL | 0 refills | Status: DC

## 2016-08-13 NOTE — Progress Notes (Signed)
Subjective:    Patient ID: Brianna Jacobs, female    DOB: 02/13/1930, 82 y.o.   MRN: [id number]  HPI Patient has pain and swelling in her right foot over the third of fourth metatarsals and near the tarsometatarsal row. There is pitting edema in that area. There is some mild erythema in that area. There is no warmth. There is tenderness to palpation. She has full range of motion at the ankle although there is some also some swelling near the lateral malleolus. The patient denies any history of an injury. This is been tender and painful for the past week or so. She has a history of significant osteoporosis with numerous vertebral fractures raising the concern of a midfoot fracture. Past Medical History:  Diagnosis Date  . ADD (attention deficit disorder)   . Arthritis   . Asthma    occassional usage of inhalers  . Bowel obstruction   . Cancer [facility])    colon cancer, 16 yrs ago  . Cataract   . Depression   . GERD (gastroesophageal reflux disease)   . Hypertension   . Neuromuscular disorder (HCC)    neuropathy  . Osteoporosis   . Scoliosis   . Spinal stenosis    Past Surgical History:  Procedure Laterality Date  . ABDOMINAL HYSTERECTOMY  1972   partial  . COLONOSCOPY     2 yrs ago  . EYE SURGERY     cataracts bilat  . FRACTURE SURGERY     thoracic spine  . JOINT REPLACEMENT     bilat shoulders  . SIGMOID RESECTION / RECTOPEXY    . SPINAL CORD STIMULATOR INSERTION  11/18/2011   Procedure: LUMBAR SPINAL CORD STIMULATOR INSERTION;  Surgeon: Dahlia Bailiff, MD;  Location: [place];  Service: Orthopedics;  Laterality: N/A;  Thoracic 9 laminiotomy, SPINAL CORD STIMULATOR PLACEMENT thoracic 9- Thoracic 7   Current Outpatient Prescriptions on File Prior to Visit  Medication Sig Dispense Refill  . alendronate (FOSAMAX) 70 MG tablet Take 1 tablet (70 mg total) by mouth every 7 (seven) days. Take with a full glass of water on an empty stomach. 12 tablet 4  . aspirin 81 MG chewable tablet  Chew 81 mg by mouth every other day.    Marland Kitchen BREO ELLIPTA 100-25 MCG/INH AEPB USE 1 INHALATION DAILY 180 each 1  . calcitonin, salmon, (MIACALCIN) 200 UNIT/ACT nasal spray Place 1 spray into alternate nostrils daily. 11.1 mL 3  . Calcium Carbonate-Vitamin D (CALCIUM + D) 600-200 MG-UNIT TABS Take 1 tablet by mouth daily.    . cetirizine (ZYRTEC) 10 MG tablet TAKE 1 TABLET DAILY 90 tablet 2  . cyanocobalamin 2000 MCG tablet Take 2,000 mcg by mouth daily.    . fentaNYL (DURAGESIC) 75 MCG/HR Place 1 patch (75 mcg total) onto the skin every 3 (three) days. 10 patch 0  . losartan (COZAAR) 50 MG tablet TAKE 1 TABLET DAILY 90 tablet 2  . Melatonin 10 MG TABS Take 1 tablet by mouth at bedtime.    . Omega-3 Fatty Acids (FISH OIL) 1200 MG CPDR Take 4 capsules by mouth daily.    . pantoprazole (PROTONIX) 40 MG tablet TAKE 1 TABLET DAILY 90 tablet 2  . Plecanatide (TRULANCE) 3 MG TABS Take 3 mg by mouth daily.    Marland Kitchen Propylene Glycol (SYSTANE BALANCE OP) Apply 1 drop to eye as needed. For dry eyes    . ranitidine (ZANTAC) 150 MG tablet TAKE 1 TABLET TWICE A DAY 180 tablet 3  .  sucralfate (CARAFATE) 1 g tablet Take 1 tablet (1 g total) by mouth 4 (four) times daily -  with meals and at bedtime. 450 tablet 3  . Triamcinolone Acetonide (TRIAMCINOLONE 0.1 % CREAM : EUCERIN) CREA Apply 1 application topically 2 (two) times daily as needed. 1 each 2  . UCERIS 9 MG TB24 Take 1 tablet by mouth daily.    Marland Kitchen XIIDRA 5 % SOLN Place 1 drop into both eyes 2 (two) times daily.      No current facility-administered medications on file prior to visit.    Allergies  Allergen Reactions  . Cymbalta [Duloxetine Hcl] Other (See Comments)    Makes me lethargic  . Lyrica [Pregabalin] Other (See Comments)    Makes me lethargic  . Ranitidine Diarrhea  . Celebrex [Celecoxib] Rash  . Naprosyn [Naproxen] Rash   Social History   Social History  . Marital status: Married    Spouse name: N/A  . Number of children: 3  . Years of  education: N/A   Occupational History  . Not on file.   Social History Main Topics  . Smoking status: Former Smoker    Quit date: 10/11/1977  . Smokeless tobacco: Never Used  . Alcohol use No  . Drug use:     Types: Oxycodone  . Sexual activity: Not on file   Other Topics Concern  . Not on file   Social History Narrative   Lives at home with husband.       Review of Systems  All other systems reviewed and are negative.      Objective:   Physical Exam  Cardiovascular: Normal rate, regular rhythm and normal heart sounds.   Pulmonary/Chest: Effort normal and breath sounds normal.  Musculoskeletal: She exhibits edema.       Right foot: There is tenderness and swelling.       Feet:  Skin: There is erythema.  Vitals reviewed.         Assessment & Plan:  Foot sprain, right, initial encounter - Plan: DG Foot Complete Right  Patient has sprained her midfoot. I cannot rule out a fracture.. I will send the patient for an x-ray of her midfoot. I recommended rest, nonweightbearing on that foot, elevation, ice pending the results of the x-ray.

## 2016-08-25 DIAGNOSIS — Z1231 Encounter for screening mammogram for malignant neoplasm of breast: Secondary | ICD-10-CM | POA: Diagnosis not present

## 2016-08-25 LAB — HM MAMMOGRAPHY

## 2016-08-26 ENCOUNTER — Encounter: Payer: Self-pay | Admitting: Family Medicine

## 2016-09-13 ENCOUNTER — Encounter: Payer: Self-pay | Admitting: Family Medicine

## 2016-09-14 ENCOUNTER — Ambulatory Visit: Payer: Medicare Other

## 2016-09-14 ENCOUNTER — Telehealth: Payer: Self-pay | Admitting: Family Medicine

## 2016-09-14 VITALS — BP 158/60

## 2016-09-14 DIAGNOSIS — M549 Dorsalgia, unspecified: Secondary | ICD-10-CM

## 2016-09-14 DIAGNOSIS — I10 Essential (primary) hypertension: Secondary | ICD-10-CM

## 2016-09-14 MED ORDER — OXYCODONE-ACETAMINOPHEN 10-325 MG PO TABS: ORAL_TABLET | ORAL | 0 refills | Status: DC

## 2016-09-14 MED ORDER — FENTANYL 75 MCG/HR TD PT72: 75.0000 ug | MEDICATED_PATCH | TRANSDERMAL | 0 refills | Status: DC

## 2016-09-14 NOTE — Telephone Encounter (Signed)
Pt called and needed refill on fentanyl - ok'd per Dr. Dennard Schaumann - RX printed, left up front and patient aware to pick up.

## 2016-09-16 ENCOUNTER — Other Ambulatory Visit: Payer: Self-pay | Admitting: Gastroenterology

## 2016-09-16 DIAGNOSIS — K219 Gastro-esophageal reflux disease without esophagitis: Secondary | ICD-10-CM | POA: Diagnosis not present

## 2016-09-16 DIAGNOSIS — R1319 Other dysphagia: Secondary | ICD-10-CM | POA: Diagnosis not present

## 2016-09-16 DIAGNOSIS — T189XXA Foreign body of alimentary tract, part unspecified, initial encounter: Secondary | ICD-10-CM | POA: Diagnosis not present

## 2016-09-16 DIAGNOSIS — R1310 Dysphagia, unspecified: Secondary | ICD-10-CM

## 2016-09-16 DIAGNOSIS — K5904 Chronic idiopathic constipation: Secondary | ICD-10-CM | POA: Diagnosis not present

## 2016-09-20 ENCOUNTER — Ambulatory Visit (INDEPENDENT_AMBULATORY_CARE_PROVIDER_SITE_OTHER): Payer: Medicare Other | Admitting: Family Medicine

## 2016-09-20 ENCOUNTER — Encounter: Payer: Self-pay | Admitting: Family Medicine

## 2016-09-20 VITALS — BP 138/74 | HR 72 | Temp 99.0°F | Resp 18 | Ht <= 58 in | Wt 133.0 lb

## 2016-09-20 DIAGNOSIS — M48061 Spinal stenosis, lumbar region without neurogenic claudication: Secondary | ICD-10-CM | POA: Diagnosis not present

## 2016-09-20 DIAGNOSIS — S22000S Wedge compression fracture of unspecified thoracic vertebra, sequela: Secondary | ICD-10-CM | POA: Diagnosis not present

## 2016-09-20 DIAGNOSIS — M5136 Other intervertebral disc degeneration, lumbar region: Secondary | ICD-10-CM

## 2016-09-20 MED ORDER — FENTANYL 25 MCG/HR TD PT72: 25.0000 ug | MEDICATED_PATCH | TRANSDERMAL | 0 refills | Status: DC

## 2016-09-20 NOTE — Progress Notes (Signed)
Subjective:    Patient ID: Brianna Jacobs, female    DOB: 02-27-1930, 82 y.o.   MRN: [id number]  HPI 01/05/16 Had MRI 2011 of lumbar spine:  T11-T12: Retropulsion produces mild central stenosis. Flattening the ventral aspect of the thoracic cord. T12-L1: Desiccated disc with broad-based posterior protrusion. Mild bilateral facet hypertrophy and ligamentum flavum redundancy without significant stenosis. L1-L2: Disc desiccation with broad-based posterior disc protrusion. Mild central stenosis. Mild bilateral facet hypertrophy. Foramina and lateral recesses patent. L2-L3: Severely degenerated and desiccated disc with degenerative endplate changes. Right greater than left facet hypertrophy. Central canal and lateral recesses patent. Moderate right foraminal stenosis associated with scoliosis and osteophytes. Right lateral disc protrusion and osteophyte could affect the exiting right L2 nerve root. Lateral protrusion and osteophyte are new compared to prior exam. L3-L4: Degenerated disc. Left eccentric broad-based posterior disc protrusion extending into the left neural foramen. Bilateral facet hypertrophy with moderate central stenosis. Left lateral recess stenosis. Moderate right foraminal stenosis with mild left foraminal stenosis. L4-L5: Severely degenerated and desiccated disc with endplate changes. Severe bilateral facet arthrosis and ligamentum flavum redundancy. Left foraminal disc protrusion and severe left foraminal stenosis with compression of the exiting left L4 nerve. Left greater than right bilateral subarticular lateral recess stenosis. Minimal right foraminal stenosis. Mild to moderate central stenosis. L5-S1: Grade 1 anterolisthesis with uncoverage of the disc and a broad-based protrusion. Bilateral lateral recess stenosis. Severe bilateral facet arthrosis with bilateral facet effusions. Mild central stenosis. Severe bilateral foraminal  stenosis with compression of both L5 nerve roots.  Patient has a long-standing history of lower back problems as evidenced in the MRI report from 2011 listed above. She also has a history of 2 separate vertebral fractures in the thoracic spine show normal x-ray of the thoracic spine in 2013. However this weekend, the patient stumbled and fell in a restaurant. Shortly thereafter, she developed severe sharp stabbing pain up and down the thoracic spine. The pain is primarily located in the paraspinal areas on either side of thoracic spine. She denies any new numbness or weakness in the right leg. However there is some new numbness and tingling located on the lateral aspect of the left thigh. She has chronic weakness in both legs and chronic neuropathy in both legs. There is no tenderness to palpation over the spinous processes of the thoracic spine or in the thoracic spine paraspinal muscles however she states the pain is most intense it has been in years.  At that time, my plan was: Differential diagnosis includes herniated disc in the thoracic spine after a fall versus vertebral fracture in the thoracic spine after a fall. I like the patient to go immediately for an x-ray of her thoracic spine. If there is a vertebral fracture, we can discuss possible kyphoplasty versus pain management. However if there is no evidence of a new vertebral fracture, I will treat the patient empirically as possibly having a herniated disc with a prednisone taper pack. I will await the results of the x-ray of the thoracic spine.  01/12/16 Patient is here today for follow-up. The pain in the center of her back is intensifying. It is 9 on a scale of 10. She is unable to walk without a walker. She has fallen 4 separate times due to the pain in her back and the worsening weakness in both legs. She also reports worsening numbness in both legs. The pain is primarily between the levels of T7 and T12. She is able to stand from  a seated  position with assistance. However she screams in pain when she does this. X-rays of the thoracic spine reveal degenerative disc disease, levoscoliosis, and stable old compression fractures with no acute changes. Prednisone taper pack was tried without relief.  At that time, my plan was: I'm concerned the patient may have spinal stenosis developing in the thoracic spine. She is unable to have an MRI of the thoracic spine due to the presence of a spinal cord stimulator. Therefore I'll schedule the patient for CT of the thoracic spine to characterize further. Meanwhile she can use fentanyl 25 g per hour every 72 hours and use oxycodone for breakthrough pain.  02/20/16 CT revealed: 1. New, from 2015, T7 compression fracture with minimal height loss and no retropulsion. An acute fracture line is not seen but given the history and subtle perispinal edema favor subacute timing. 2. Remote T9, T10, T12, and L1 compression fractures as described.  Patient underwent T7 kyphoplasty and vertebroplasty 4/25.  I also recommended fosamax.  Patient states the pain is worse. She now screams in pain whenever she moves to get out of a chair. She denies any paralysis in her legs. She does state that the numbness in her legs is getting worse. Weakness in her legs is not getting worse. She denies any bowel or bladder incontinence. She denies any saddle anesthesia. She denies any shortness of breath although she states it hurts to take a deep breath in. She also complains of pain and difficulty and dysphasia/trouble swallowing. She blames this on the addition of Fosamax.  At that time, my plan was: Very complicated situation. I explained to the patient I will focus on the pain first try to manage and control her pain and then see how many of her residual symptoms including dysphagia and trouble breathing persist. Increase fentanyl from 25 g per hour to 75 g per hour. Continue use oxycodone 10/325 one every 6 hours as needed.  Add calcitonin nasal spray daily for her acute thoracic compression fracture. Recheck in one week. If her pain is better and she continues to have dysphasia discontinue Fosamax and begin GI workup. I would replace Fosamax with prolia. I see no evidence of an infection, cauda equina syndrome, or pulmonary embolism  03/18/16 Patient presents today complaining of severe pain in her back. She does think the fentanyl is helping. She is using Percocet 3-4 times a day. She uses a half a tablet to a whole tablet each time. She never started taking calcitonin due to concern regarding the side effects. She also reports worsening neuropathy in her right leg. Burning and stinging pain will wake her up at night radiating from her right thigh down to her right foot. The weakness in her legs is not getting worse. She denies any symptoms of cauda equina syndrome.  At that  Time, my plan was: Continue fentanyl 75 g every 72 hours. She will continue to use Percocet 10/325 1/2-1 tablet every 6 hours as needed for pain. She has tried and failed gabapentin, Lyrica, Cymbalta, and amitriptyline for neuropathic pain. The only other option would know to try would be trokendi off label.  At the present time she is not interested in trying this. We will discontinue calcitonin as she is afraid of the side effects. The dysphagia is not getting worse and therefore we will discontinue the Carafate. Since the dysphagia is not getting worse, I will continue to have her on Fosamax given the multiple vertebral fractures she is having.  04/15/16 Patient is here today complaining of severe mid back pain all throughout her thoracic spine. Patient reports severe sharp pain with rotation of her trunk, with flexion, with extension of her spine. She has a difficult time getting up from a seated position. She has a difficult time sitting down. She has difficult time riding in a car. Any movement causes her to scream in pain. Fentanyl patches and  Percocet every 6 hours is not providing her significant relief. She never started the calcitonin nasal spray.  AT that time, my plan was: Given her advanced age, I am hesitant to increase her narcotics higher due to potential side effects.  I will rule out other potential causes of back pain by obtaining an SPEP and UPEP although I believe her pain is most likely due to the compression fractures in her thoracic spine coupled with her lumbar degenerative disc disease and mild to moderate spinal stenosis.  Therefore I will consult the pain clinic to better try to manage her pain as I have been unsuccessful.  05/25/16 Patient has yet to see the pain clinic. Fortunately however it seems like her pain is improving. She is still not using calcitonin. She is here today because she would like to go over all the medication she is taking. I reviewed with her the med list in detail. I explained to her the calcitonin is simply for bone pain. She can certainly stop that and discard the medication if she does not feel it's beneficial. I explained to the patient that Carafate is intended to treat ulcers and gastritis. At the present time she denies any symptoms of these. She is already on pantoprazole as well as Zantac which seemed to control her reflux. However she still complains of dysphasia. She also complains of food sticking in her upper esophagus at times. I recommended that she follow-up with her gastroenterologist as she may need an EGD and dilatation which she has had in the past.  Her blood pressure today is elevated. She is on losartan 50 mg by mouth daily she states that this works well but she has not been checking her blood pressure at home.  AT that time, my plan was: Patient is scheduling herself to meet with a neurosurgeon to discuss other options for pain control regarding her mid back pain. Fortunately this is improving. I recommended she discontinue calcitonin as she has been sporadic and using the  medication. Also recommended that she discontinue Carafate as her symptoms now do not suggest gastritis or an ulcer. However she is having dysphasia so I recommended that she call her gastroenterologist and arrange a follow-up to discuss possible repeat EGD. Her blood pressure today is elevated. I recommended that she check her blood pressure frequently over the next week and then provide the values to me to review. If consistently greater than 140/90, I would increase her losartan to 100 mg a day  09/20/16 Patient is here today to discuss her back pain. She's been doing very well on the fentanyl 75 g every 72 hours. She is usually only requiring one Percocet at night to help her sleep. However I will see I am very concerned about risk and an 82 year old female continuing fentanyl long-term. She is here today to discuss this further. She continues to have pain in her thoracic spine and her lumbar spine on a daily basis. She continues to have neuropathic pain in both legs. There is no evidence of cauda equina syndrome but rather only persistent pain.  At the present time the pain is not interfering with any activity of daily living.  Past Medical History:  Diagnosis Date  . ADD (attention deficit disorder)   . Arthritis   . Asthma    occassional usage of inhalers  . Bowel obstruction   . Cancer [facility])    colon cancer, 16 yrs ago  . Cataract   . Depression   . GERD (gastroesophageal reflux disease)   . Hypertension   . Neuromuscular disorder (HCC)    neuropathy  . Osteoporosis   . Scoliosis   . Spinal stenosis    Past Surgical History:  Procedure Laterality Date  . ABDOMINAL HYSTERECTOMY  1972   partial  . COLONOSCOPY     2 yrs ago  . EYE SURGERY     cataracts bilat  . FRACTURE SURGERY     thoracic spine  . JOINT REPLACEMENT     bilat shoulders  . SIGMOID RESECTION / RECTOPEXY    . SPINAL CORD STIMULATOR INSERTION  11/18/2011   Procedure: LUMBAR SPINAL CORD STIMULATOR INSERTION;   Surgeon: Dahlia Bailiff, MD;  Location: [place];  Service: Orthopedics;  Laterality: N/A;  Thoracic 9 laminiotomy, SPINAL CORD STIMULATOR PLACEMENT thoracic 9- Thoracic 7   Current Outpatient Prescriptions on File Prior to Visit  Medication Sig Dispense Refill  . alendronate (FOSAMAX) 70 MG tablet Take 1 tablet (70 mg total) by mouth every 7 (seven) days. Take with a full glass of water on an empty stomach. 12 tablet 4  . aspirin 81 MG chewable tablet Chew 81 mg by mouth every other day.    Marland Kitchen BREO ELLIPTA 100-25 MCG/INH AEPB USE 1 INHALATION DAILY 180 each 1  . calcitonin, salmon, (MIACALCIN) 200 UNIT/ACT nasal spray Place 1 spray into alternate nostrils daily. 11.1 mL 3  . Calcium Carbonate-Vitamin D (CALCIUM + D) 600-200 MG-UNIT TABS Take 1 tablet by mouth daily.    . cetirizine (ZYRTEC) 10 MG tablet TAKE 1 TABLET DAILY 90 tablet 2  . cyanocobalamin 2000 MCG tablet Take 2,000 mcg by mouth daily.    . fentaNYL (DURAGESIC) 75 MCG/HR Place 1 patch (75 mcg total) onto the skin every 3 (three) days. 10 patch 0  . losartan (COZAAR) 50 MG tablet TAKE 1 TABLET DAILY 90 tablet 2  . Melatonin 10 MG TABS Take 1 tablet by mouth at bedtime.    . Omega-3 Fatty Acids (FISH OIL) 1200 MG CPDR Take 4 capsules by mouth daily.    Marland Kitchen oxyCODONE-acetaminophen (PERCOCET) 10-325 MG tablet .5 - 1 tab po Q 6hours 90 tablet 0  . pantoprazole (PROTONIX) 40 MG tablet TAKE 1 TABLET DAILY 90 tablet 2  . Plecanatide (TRULANCE) 3 MG TABS Take 3 mg by mouth daily.    Marland Kitchen Propylene Glycol (SYSTANE BALANCE OP) Apply 1 drop to eye as needed. For dry eyes    . ranitidine (ZANTAC) 150 MG tablet TAKE 1 TABLET TWICE A DAY 180 tablet 3  . sucralfate (CARAFATE) 1 g tablet Take 1 tablet (1 g total) by mouth 4 (four) times daily -  with meals and at bedtime. 450 tablet 3  . Triamcinolone Acetonide (TRIAMCINOLONE 0.1 % CREAM : EUCERIN) CREA Apply 1 application topically 2 (two) times daily as needed. 1 each 2  . UCERIS 9 MG TB24 Take 1 tablet  by mouth daily.    Marland Kitchen XIIDRA 5 % SOLN Place 1 drop into both eyes 2 (two) times daily.      No current facility-administered medications  on file prior to visit.    Allergies  Allergen Reactions  . Cymbalta [Duloxetine Hcl] Other (See Comments)    Makes me lethargic  . Lyrica [Pregabalin] Other (See Comments)    Makes me lethargic  . Ranitidine Diarrhea  . Celebrex [Celecoxib] Rash  . Naprosyn [Naproxen] Rash   Social History   Social History  . Marital status: Married    Spouse name: N/A  . Number of children: 3  . Years of education: N/A   Occupational History  . Not on file.   Social History Main Topics  . Smoking status: Former Smoker    Quit date: 10/11/1977  . Smokeless tobacco: Never Used  . Alcohol use No  . Drug use:     Types: Oxycodone  . Sexual activity: Not on file   Other Topics Concern  . Not on file   Social History Narrative   Lives at home with husband.     Review of Systems  All other systems reviewed and are negative.      Objective:   Physical Exam  Constitutional: She appears well-developed and well-nourished.  Cardiovascular: Normal rate, regular rhythm and normal heart sounds.   No murmur heard. Pulmonary/Chest: Effort normal and breath sounds normal. No respiratory distress. She has no wheezes. She has no rales.  Abdominal: Soft. Bowel sounds are normal.  Musculoskeletal:       Thoracic back: She exhibits decreased range of motion, tenderness, bony tenderness and pain.       Lumbar back: She exhibits decreased range of motion, tenderness, bony tenderness and pain.  Vitals reviewed.         Assessment & Plan:  Spinal stenosis of lumbar region, unspecified whether neurogenic claudication present  Compression fx, thoracic spine, sequela  DDD (degenerative disc disease), lumbar We discussed the risk of narcotic overdose given her advanced age on high-dose fentanyl. We will wean the patient down to 25 g every 72 hours to avoid  withdrawal given the fact the patient is been on this medication from a 7 months. She can continue to use the Percocet for breakthrough pain as needed. In one month, if the patient is doing well we will discontinue fentanyl and replaced with Percocet 10/325 po q 6 hrs prn (120/month).

## 2016-09-21 ENCOUNTER — Ambulatory Visit
Admission: RE | Admit: 2016-09-21 | Discharge: 2016-09-21 | Disposition: A | Discharge status: Home / Self Care | Payer: Medicare Other | Source: Ambulatory Visit | Attending: Gastroenterology | Admitting: Gastroenterology

## 2016-09-21 DIAGNOSIS — R1310 Dysphagia, unspecified: Secondary | ICD-10-CM | POA: Diagnosis not present

## 2016-09-21 DIAGNOSIS — K219 Gastro-esophageal reflux disease without esophagitis: Secondary | ICD-10-CM | POA: Diagnosis not present

## 2016-09-27 ENCOUNTER — Encounter: Payer: Self-pay | Admitting: Family Medicine

## 2016-10-20 DIAGNOSIS — M810 Age-related osteoporosis without current pathological fracture: Secondary | ICD-10-CM | POA: Diagnosis not present

## 2016-10-20 DIAGNOSIS — M546 Pain in thoracic spine: Secondary | ICD-10-CM | POA: Diagnosis not present

## 2016-10-25 ENCOUNTER — Encounter: Payer: Self-pay | Admitting: Family Medicine

## 2016-11-17 DIAGNOSIS — G8929 Other chronic pain: Secondary | ICD-10-CM | POA: Diagnosis not present

## 2016-11-17 DIAGNOSIS — M810 Age-related osteoporosis without current pathological fracture: Secondary | ICD-10-CM | POA: Diagnosis not present

## 2016-11-17 DIAGNOSIS — M546 Pain in thoracic spine: Secondary | ICD-10-CM | POA: Diagnosis not present

## 2016-12-17 ENCOUNTER — Other Ambulatory Visit: Payer: Self-pay | Admitting: Family Medicine

## 2016-12-17 ENCOUNTER — Ambulatory Visit
Admission: RE | Admit: 2016-12-17 | Discharge: 2016-12-17 | Disposition: A | Discharge status: Home / Self Care | Payer: Medicare Other | Source: Ambulatory Visit | Attending: Family Medicine | Admitting: Family Medicine

## 2016-12-17 ENCOUNTER — Encounter: Payer: Self-pay | Admitting: Family Medicine

## 2016-12-17 DIAGNOSIS — G8929 Other chronic pain: Secondary | ICD-10-CM

## 2016-12-17 DIAGNOSIS — M25511 Pain in right shoulder: Principal | ICD-10-CM

## 2016-12-25 ENCOUNTER — Other Ambulatory Visit: Payer: Self-pay | Admitting: Family Medicine

## 2016-12-28 DIAGNOSIS — M810 Age-related osteoporosis without current pathological fracture: Secondary | ICD-10-CM | POA: Diagnosis not present

## 2016-12-28 DIAGNOSIS — Z9889 Other specified postprocedural states: Secondary | ICD-10-CM | POA: Diagnosis not present

## 2016-12-28 DIAGNOSIS — M546 Pain in thoracic spine: Secondary | ICD-10-CM | POA: Diagnosis not present

## 2017-01-04 ENCOUNTER — Telehealth: Payer: Self-pay | Admitting: Family Medicine

## 2017-01-04 MED ORDER — OXYCODONE-ACETAMINOPHEN 10-325 MG PO TABS: ORAL_TABLET | ORAL | 0 refills | Status: DC

## 2017-01-04 NOTE — Telephone Encounter (Signed)
Patient requesting a refill on Oxycodone - Ok to refill??        

## 2017-01-04 NOTE — Telephone Encounter (Signed)
RX printed, left up front and patient aware to pick up  

## 2017-01-04 NOTE — Telephone Encounter (Signed)
ok 

## 2017-01-15 ENCOUNTER — Other Ambulatory Visit: Payer: Self-pay | Admitting: Family Medicine

## 2017-01-17 NOTE — Telephone Encounter (Signed)
Medication refilled per protocol. 

## 2017-02-20 ENCOUNTER — Encounter: Payer: Self-pay | Admitting: Family Medicine

## 2017-02-24 ENCOUNTER — Ambulatory Visit (INDEPENDENT_AMBULATORY_CARE_PROVIDER_SITE_OTHER): Payer: Medicare Other | Admitting: Family Medicine

## 2017-02-24 ENCOUNTER — Encounter: Payer: Self-pay | Admitting: Family Medicine

## 2017-02-24 VITALS — BP 132/68 | HR 80 | Temp 98.2°F | Resp 16 | Ht <= 58 in | Wt 134.0 lb

## 2017-02-24 DIAGNOSIS — M48061 Spinal stenosis, lumbar region without neurogenic claudication: Secondary | ICD-10-CM | POA: Diagnosis not present

## 2017-02-24 DIAGNOSIS — S22000S Wedge compression fracture of unspecified thoracic vertebra, sequela: Secondary | ICD-10-CM

## 2017-02-24 DIAGNOSIS — M5136 Other intervertebral disc degeneration, lumbar region: Secondary | ICD-10-CM | POA: Diagnosis not present

## 2017-02-24 NOTE — Progress Notes (Signed)
Subjective:    Patient ID: Brianna Jacobs, female    DOB: 01-Mar-1930, 83 y.o.   MRN: [id number]  HPI 01/05/16 Had MRI 2011 of lumbar spine:  T11-T12: Retropulsion produces mild central stenosis. Flattening the ventral aspect of the thoracic cord. T12-L1: Desiccated disc with broad-based posterior protrusion. Mild bilateral facet hypertrophy and ligamentum flavum redundancy without significant stenosis. L1-L2: Disc desiccation with broad-based posterior disc protrusion. Mild central stenosis. Mild bilateral facet hypertrophy. Foramina and lateral recesses patent. L2-L3: Severely degenerated and desiccated disc with degenerative endplate changes. Right greater than left facet hypertrophy. Central canal and lateral recesses patent. Moderate right foraminal stenosis associated with scoliosis and osteophytes. Right lateral disc protrusion and osteophyte could affect the exiting right L2 nerve root. Lateral protrusion and osteophyte are new compared to prior exam. L3-L4: Degenerated disc. Left eccentric broad-based posterior disc protrusion extending into the left neural foramen. Bilateral facet hypertrophy with moderate central stenosis. Left lateral recess stenosis. Moderate right foraminal stenosis with mild left foraminal stenosis. L4-L5: Severely degenerated and desiccated disc with endplate changes. Severe bilateral facet arthrosis and ligamentum flavum redundancy. Left foraminal disc protrusion and severe left foraminal stenosis with compression of the exiting left L4 nerve. Left greater than right bilateral subarticular lateral recess stenosis. Minimal right foraminal stenosis. Mild to moderate central stenosis. L5-S1: Grade 1 anterolisthesis with uncoverage of the disc and a broad-based protrusion. Bilateral lateral recess stenosis. Severe bilateral facet arthrosis with bilateral facet effusions. Mild central stenosis. Severe bilateral foraminal  stenosis with compression of both L5 nerve roots.  Patient has a long-standing history of lower back problems as evidenced in the MRI report from 2011 listed above. She also has a history of 2 separate vertebral fractures in the thoracic spine show normal x-ray of the thoracic spine in 2013. However this weekend, the patient stumbled and fell in a restaurant. Shortly thereafter, she developed severe sharp stabbing pain up and down the thoracic spine. The pain is primarily located in the paraspinal areas on either side of thoracic spine. She denies any new numbness or weakness in the right leg. However there is some new numbness and tingling located on the lateral aspect of the left thigh. She has chronic weakness in both legs and chronic neuropathy in both legs. There is no tenderness to palpation over the spinous processes of the thoracic spine or in the thoracic spine paraspinal muscles however she states the pain is most intense it has been in years.  At that time, my plan was: Differential diagnosis includes herniated disc in the thoracic spine after a fall versus vertebral fracture in the thoracic spine after a fall. I like the patient to go immediately for an x-ray of her thoracic spine. If there is a vertebral fracture, we can discuss possible kyphoplasty versus pain management. However if there is no evidence of a new vertebral fracture, I will treat the patient empirically as possibly having a herniated disc with a prednisone taper pack. I will await the results of the x-ray of the thoracic spine.  01/12/16 Patient is here today for follow-up. The pain in the center of her back is intensifying. It is 9 on a scale of 10. She is unable to walk without a walker. She has fallen 4 separate times due to the pain in her back and the worsening weakness in both legs. She also reports worsening numbness in both legs. The pain is primarily between the levels of T7 and T12. She is able to stand from  a seated  position with assistance. However she screams in pain when she does this. X-rays of the thoracic spine reveal degenerative disc disease, levoscoliosis, and stable old compression fractures with no acute changes. Prednisone taper pack was tried without relief.  At that time, my plan was: I'm concerned the patient may have spinal stenosis developing in the thoracic spine. She is unable to have an MRI of the thoracic spine due to the presence of a spinal cord stimulator. Therefore I'll schedule the patient for CT of the thoracic spine to characterize further. Meanwhile she can use fentanyl 25 g per hour every 72 hours and use oxycodone for breakthrough pain.  02/20/16 CT revealed: 1. New, from 2015, T7 compression fracture with minimal height loss and no retropulsion. An acute fracture line is not seen but given the history and subtle perispinal edema favor subacute timing. 2. Remote T9, T10, T12, and L1 compression fractures as described.  Patient underwent T7 kyphoplasty and vertebroplasty 4/25.  I also recommended fosamax.  Patient states the pain is worse. She now screams in pain whenever she moves to get out of a chair. She denies any paralysis in her legs. She does state that the numbness in her legs is getting worse. Weakness in her legs is not getting worse. She denies any bowel or bladder incontinence. She denies any saddle anesthesia. She denies any shortness of breath although she states it hurts to take a deep breath in. She also complains of pain and difficulty and dysphasia/trouble swallowing. She blames this on the addition of Fosamax.  At that time, my plan was: Very complicated situation. I explained to the patient I will focus on the pain first try to manage and control her pain and then see how many of her residual symptoms including dysphagia and trouble breathing persist. Increase fentanyl from 25 g per hour to 75 g per hour. Continue use oxycodone 10/325 one every 6 hours as needed.  Add calcitonin nasal spray daily for her acute thoracic compression fracture. Recheck in one week. If her pain is better and she continues to have dysphasia discontinue Fosamax and begin GI workup. I would replace Fosamax with prolia. I see no evidence of an infection, cauda equina syndrome, or pulmonary embolism  03/18/16 Patient presents today complaining of severe pain in her back. She does think the fentanyl is helping. She is using Percocet 3-4 times a day. She uses a half a tablet to a whole tablet each time. She never started taking calcitonin due to concern regarding the side effects. She also reports worsening neuropathy in her right leg. Burning and stinging pain will wake her up at night radiating from her right thigh down to her right foot. The weakness in her legs is not getting worse. She denies any symptoms of cauda equina syndrome.  At that  Time, my plan was: Continue fentanyl 75 g every 72 hours. She will continue to use Percocet 10/325 1/2-1 tablet every 6 hours as needed for pain. She has tried and failed gabapentin, Lyrica, Cymbalta, and amitriptyline for neuropathic pain. The only other option would know to try would be trokendi off label.  At the present time she is not interested in trying this. We will discontinue calcitonin as she is afraid of the side effects. The dysphagia is not getting worse and therefore we will discontinue the Carafate. Since the dysphagia is not getting worse, I will continue to have her on Fosamax given the multiple vertebral fractures she is having.  04/15/16 Patient is here today complaining of severe mid back pain all throughout her thoracic spine. Patient reports severe sharp pain with rotation of her trunk, with flexion, with extension of her spine. She has a difficult time getting up from a seated position. She has a difficult time sitting down. She has difficult time riding in a car. Any movement causes her to scream in pain. Fentanyl patches and  Percocet every 6 hours is not providing her significant relief. She never started the calcitonin nasal spray.  AT that time, my plan was: Given her advanced age, I am hesitant to increase her narcotics higher due to potential side effects.  I will rule out other potential causes of back pain by obtaining an SPEP and UPEP although I believe her pain is most likely due to the compression fractures in her thoracic spine coupled with her lumbar degenerative disc disease and mild to moderate spinal stenosis.  Therefore I will consult the pain clinic to better try to manage her pain as I have been unsuccessful.  05/25/16 Patient has yet to see the pain clinic. Fortunately however it seems like her pain is improving. She is still not using calcitonin. She is here today because she would like to go over all the medication she is taking. I reviewed with her the med list in detail. I explained to her the calcitonin is simply for bone pain. She can certainly stop that and discard the medication if she does not feel it's beneficial. I explained to the patient that Carafate is intended to treat ulcers and gastritis. At the present time she denies any symptoms of these. She is already on pantoprazole as well as Zantac which seemed to control her reflux. However she still complains of dysphasia. She also complains of food sticking in her upper esophagus at times. I recommended that she follow-up with her gastroenterologist as she may need an EGD and dilatation which she has had in the past.  Her blood pressure today is elevated. She is on losartan 50 mg by mouth daily she states that this works well but she has not been checking her blood pressure at home.  AT that time, my plan was: Patient is scheduling herself to meet with a neurosurgeon to discuss other options for pain control regarding her mid back pain. Fortunately this is improving. I recommended she discontinue calcitonin as she has been sporadic and using the  medication. Also recommended that she discontinue Carafate as her symptoms now do not suggest gastritis or an ulcer. However she is having dysphasia so I recommended that she call her gastroenterologist and arrange a follow-up to discuss possible repeat EGD. Her blood pressure today is elevated. I recommended that she check her blood pressure frequently over the next week and then provide the values to me to review. If consistently greater than 140/90, I would increase her losartan to 100 mg a day  09/20/16 Patient is here today to discuss her back pain. She's been doing very well on the fentanyl 75 g every 72 hours. She is usually only requiring one Percocet at night to help her sleep. However I will see I am very concerned about risk and an 82 year old female continuing fentanyl long-term. She is here today to discuss this further. She continues to have pain in her thoracic spine and her lumbar spine on a daily basis. She continues to have neuropathic pain in both legs. There is no evidence of cauda equina syndrome but rather only persistent pain.  At the present time the pain is not interfering with any activity of daily living.  Atthat time, my plan was: We discussed the risk of narcotic overdose given her advanced age on high-dose fentanyl. We will wean the patient down to 25 g every 72 hours to avoid withdrawal given the fact the patient is been on this medication from a 7 months. She can continue to use the Percocet for breakthrough pain as needed. In one month, if the patient is doing well we will discontinue fentanyl and replaced with Percocet 10/325 po q 6 hrs prn (120/month).  02/24/17 Patient has a history of chronic back pain is well outlined above. She reports pain in her neck mid back, and lower back. It is exacerbated by everyday movement. She also has peripheral neuropathy in her legs. She is successfully discontinued fentanyl and still takes Percocet but does so rarely only for breakthrough  pain. She is here today to discuss other options for treatment of her pain. She believes she may have fibromyalgia. I disagree. I believe there is a biomechanical mechanism of her chronic back pain beyond that explained by fibromyalgia. She is interested in seeing a chiropractor for "lasera" treatments. Essentially this uses a laser to "cause vasodilation to improve local blood flow and local oxygen levels to improve chronic pain".  Past Medical History:  Diagnosis Date  . ADD (attention deficit disorder)   . Arthritis   . Asthma    occassional usage of inhalers  . Bowel obstruction ([place])   . Cancer [facility])    colon cancer, 16 yrs ago  . Cataract   . Depression   . GERD (gastroesophageal reflux disease)   . Hypertension   . Neuromuscular disorder ([facility])    neuropathy  . Osteoporosis   . Scoliosis   . Spinal stenosis    Past Surgical History:  Procedure Laterality Date  . ABDOMINAL HYSTERECTOMY  1972   partial  . COLONOSCOPY     2 yrs ago  . EYE SURGERY     cataracts bilat  . FRACTURE SURGERY     thoracic spine  . JOINT REPLACEMENT     bilat shoulders  . SIGMOID RESECTION / RECTOPEXY    . SPINAL CORD STIMULATOR INSERTION  11/18/2011   Procedure: LUMBAR SPINAL CORD STIMULATOR INSERTION;  Surgeon: Dahlia Bailiff, MD;  Location: [place];  Service: Orthopedics;  Laterality: N/A;  Thoracic 9 laminiotomy, SPINAL CORD STIMULATOR PLACEMENT thoracic 9- Thoracic 7   Current Outpatient Prescriptions on File Prior to Visit  Medication Sig Dispense Refill  . alendronate (FOSAMAX) 70 MG tablet Take 1 tablet (70 mg total) by mouth every 7 (seven) days. Take with a full glass of water on an empty stomach. 12 tablet 4  . aspirin 81 MG chewable tablet Chew 81 mg by mouth every other day.    Marland Kitchen BREO ELLIPTA 100-25 MCG/INH AEPB USE 1 INHALATION DAILY 180 each 1  . calcitonin, salmon, (MIACALCIN) 200 UNIT/ACT nasal spray Place 1 spray into alternate nostrils daily. 11.1 mL 3  . Calcium  Carbonate-Vitamin D (CALCIUM + D) 600-200 MG-UNIT TABS Take 1 tablet by mouth daily.    . cetirizine (ZYRTEC) 10 MG tablet TAKE 1 TABLET DAILY 90 tablet 3  . cyanocobalamin 2000 MCG tablet Take 2,000 mcg by mouth daily.    Marland Kitchen losartan (COZAAR) 50 MG tablet TAKE 1 TABLET DAILY 90 tablet 2  . Melatonin 10 MG TABS Take 1 tablet by mouth at bedtime.    Marland Kitchen  Omega-3 Fatty Acids (FISH OIL) 1200 MG CPDR Take 4 capsules by mouth daily.    Marland Kitchen oxyCODONE-acetaminophen (PERCOCET) 10-325 MG tablet .5 - 1 tab po Q 6hours 90 tablet 0  . pantoprazole (PROTONIX) 40 MG tablet TAKE 1 TABLET DAILY 90 tablet 2  . Plecanatide (TRULANCE) 3 MG TABS Take 3 mg by mouth daily.    Marland Kitchen Propylene Glycol (SYSTANE BALANCE OP) Apply 1 drop to eye as needed. For dry eyes    . ranitidine (ZANTAC) 150 MG tablet TAKE 1 TABLET TWICE A DAY 180 tablet 3  . sucralfate (CARAFATE) 1 g tablet Take 1 tablet (1 g total) by mouth 4 (four) times daily -  with meals and at bedtime. 450 tablet 3  . Triamcinolone Acetonide (TRIAMCINOLONE 0.1 % CREAM : EUCERIN) CREA Apply 1 application topically 2 (two) times daily as needed. 1 each 2  . UCERIS 9 MG TB24 Take 1 tablet by mouth daily.    Marland Kitchen XIIDRA 5 % SOLN Place 1 drop into both eyes 2 (two) times daily.      No current facility-administered medications on file prior to visit.    Allergies  Allergen Reactions  . Cymbalta [Duloxetine Hcl] Other (See Comments)    Makes me lethargic  . Lyrica [Pregabalin] Other (See Comments)    Makes me lethargic  . Ranitidine Diarrhea  . Celebrex [Celecoxib] Rash  . Naprosyn [Naproxen] Rash   Social History   Social History  . Marital status: Married    Spouse name: N/A  . Number of children: 3  . Years of education: N/A   Occupational History  . Not on file.   Social History Main Topics  . Smoking status: Former Smoker    Quit date: 10/11/1977  . Smokeless tobacco: Never Used  . Alcohol use No  . Drug use: Yes    Types: Oxycodone  . Sexual activity:  Not on file   Other Topics Concern  . Not on file   Social History Narrative   Lives at home with husband.     Review of Systems  All other systems reviewed and are negative.      Objective:   Physical Exam  Constitutional: She appears well-developed and well-nourished.  Cardiovascular: Normal rate, regular rhythm and normal heart sounds.   No murmur heard. Pulmonary/Chest: Effort normal and breath sounds normal. No respiratory distress. She has no wheezes. She has no rales.  Abdominal: Soft. Bowel sounds are normal.  Musculoskeletal:       Thoracic back: She exhibits decreased range of motion, tenderness, bony tenderness and pain.       Lumbar back: She exhibits decreased range of motion, tenderness, bony tenderness and pain.  Vitals reviewed.         Assessment & Plan:  Compression fx, thoracic spine, sequela  DDD (degenerative disc disease), lumbar  Spinal stenosis of lumbar region without neurogenic claudication  My heart goes out for this patient. She is in chronic pain. Medicine has very little to offer her. She is tried a spinal cord stimulator with no benefit. She's tried epidural steroid injections with no benefit. She is a poor surgical candidate given her severe osteoporosis. She is currently taking regular narcotics to manage her back pain. I explained to the patient that I do not believe that the [place] therapy is FDA approved. Therefore I'm not an advocate for this. However the patient is very hopeful because she's had a friend who has tried it and is seeing significant  benefit. I see no medical contraindications to her receiving this treatment and therefore I'm fine with her trying it as long as she realizes that it has not been rigorously studied by the FDA. She'll receive this treatment through a local chiropractor's  office.

## 2017-02-25 DIAGNOSIS — H16223 Keratoconjunctivitis sicca, not specified as Sjogren's, bilateral: Secondary | ICD-10-CM | POA: Diagnosis not present

## 2017-02-25 DIAGNOSIS — H01022 Squamous blepharitis right lower eyelid: Secondary | ICD-10-CM | POA: Diagnosis not present

## 2017-02-25 DIAGNOSIS — H01025 Squamous blepharitis left lower eyelid: Secondary | ICD-10-CM | POA: Diagnosis not present

## 2017-03-21 DIAGNOSIS — M9903 Segmental and somatic dysfunction of lumbar region: Secondary | ICD-10-CM | POA: Diagnosis not present

## 2017-03-21 DIAGNOSIS — G603 Idiopathic progressive neuropathy: Secondary | ICD-10-CM | POA: Diagnosis not present

## 2017-03-21 DIAGNOSIS — M9904 Segmental and somatic dysfunction of sacral region: Secondary | ICD-10-CM | POA: Diagnosis not present

## 2017-03-21 DIAGNOSIS — M791 Myalgia: Secondary | ICD-10-CM | POA: Diagnosis not present

## 2017-03-21 DIAGNOSIS — M9901 Segmental and somatic dysfunction of cervical region: Secondary | ICD-10-CM | POA: Diagnosis not present

## 2017-03-21 DIAGNOSIS — M5417 Radiculopathy, lumbosacral region: Secondary | ICD-10-CM | POA: Diagnosis not present

## 2017-03-21 DIAGNOSIS — M5384 Other specified dorsopathies, thoracic region: Secondary | ICD-10-CM | POA: Diagnosis not present

## 2017-03-21 DIAGNOSIS — M62830 Muscle spasm of back: Secondary | ICD-10-CM | POA: Diagnosis not present

## 2017-03-21 DIAGNOSIS — M461 Sacroiliitis, not elsewhere classified: Secondary | ICD-10-CM | POA: Diagnosis not present

## 2017-03-21 DIAGNOSIS — M9902 Segmental and somatic dysfunction of thoracic region: Secondary | ICD-10-CM | POA: Diagnosis not present

## 2017-03-21 DIAGNOSIS — M50322 Other cervical disc degeneration at C5-C6 level: Secondary | ICD-10-CM | POA: Diagnosis not present

## 2017-03-21 DIAGNOSIS — M9905 Segmental and somatic dysfunction of pelvic region: Secondary | ICD-10-CM | POA: Diagnosis not present

## 2017-03-23 DIAGNOSIS — M50322 Other cervical disc degeneration at C5-C6 level: Secondary | ICD-10-CM | POA: Diagnosis not present

## 2017-03-23 DIAGNOSIS — M9903 Segmental and somatic dysfunction of lumbar region: Secondary | ICD-10-CM | POA: Diagnosis not present

## 2017-03-23 DIAGNOSIS — M9901 Segmental and somatic dysfunction of cervical region: Secondary | ICD-10-CM | POA: Diagnosis not present

## 2017-03-23 DIAGNOSIS — M9904 Segmental and somatic dysfunction of sacral region: Secondary | ICD-10-CM | POA: Diagnosis not present

## 2017-03-23 DIAGNOSIS — M9905 Segmental and somatic dysfunction of pelvic region: Secondary | ICD-10-CM | POA: Diagnosis not present

## 2017-03-23 DIAGNOSIS — G603 Idiopathic progressive neuropathy: Secondary | ICD-10-CM | POA: Diagnosis not present

## 2017-03-23 DIAGNOSIS — M791 Myalgia: Secondary | ICD-10-CM | POA: Diagnosis not present

## 2017-03-23 DIAGNOSIS — M5384 Other specified dorsopathies, thoracic region: Secondary | ICD-10-CM | POA: Diagnosis not present

## 2017-03-23 DIAGNOSIS — M9902 Segmental and somatic dysfunction of thoracic region: Secondary | ICD-10-CM | POA: Diagnosis not present

## 2017-03-23 DIAGNOSIS — M62830 Muscle spasm of back: Secondary | ICD-10-CM | POA: Diagnosis not present

## 2017-03-23 DIAGNOSIS — M461 Sacroiliitis, not elsewhere classified: Secondary | ICD-10-CM | POA: Diagnosis not present

## 2017-03-23 DIAGNOSIS — M5417 Radiculopathy, lumbosacral region: Secondary | ICD-10-CM | POA: Diagnosis not present

## 2017-03-24 DIAGNOSIS — M62830 Muscle spasm of back: Secondary | ICD-10-CM | POA: Diagnosis not present

## 2017-03-24 DIAGNOSIS — M9904 Segmental and somatic dysfunction of sacral region: Secondary | ICD-10-CM | POA: Diagnosis not present

## 2017-03-24 DIAGNOSIS — M9901 Segmental and somatic dysfunction of cervical region: Secondary | ICD-10-CM | POA: Diagnosis not present

## 2017-03-24 DIAGNOSIS — M461 Sacroiliitis, not elsewhere classified: Secondary | ICD-10-CM | POA: Diagnosis not present

## 2017-03-24 DIAGNOSIS — M9902 Segmental and somatic dysfunction of thoracic region: Secondary | ICD-10-CM | POA: Diagnosis not present

## 2017-03-24 DIAGNOSIS — M50322 Other cervical disc degeneration at C5-C6 level: Secondary | ICD-10-CM | POA: Diagnosis not present

## 2017-03-24 DIAGNOSIS — M9905 Segmental and somatic dysfunction of pelvic region: Secondary | ICD-10-CM | POA: Diagnosis not present

## 2017-03-24 DIAGNOSIS — M791 Myalgia: Secondary | ICD-10-CM | POA: Diagnosis not present

## 2017-03-24 DIAGNOSIS — M5384 Other specified dorsopathies, thoracic region: Secondary | ICD-10-CM | POA: Diagnosis not present

## 2017-03-24 DIAGNOSIS — M5417 Radiculopathy, lumbosacral region: Secondary | ICD-10-CM | POA: Diagnosis not present

## 2017-03-24 DIAGNOSIS — M9903 Segmental and somatic dysfunction of lumbar region: Secondary | ICD-10-CM | POA: Diagnosis not present

## 2017-03-24 DIAGNOSIS — G603 Idiopathic progressive neuropathy: Secondary | ICD-10-CM | POA: Diagnosis not present

## 2017-03-28 DIAGNOSIS — M9901 Segmental and somatic dysfunction of cervical region: Secondary | ICD-10-CM | POA: Diagnosis not present

## 2017-03-28 DIAGNOSIS — M5417 Radiculopathy, lumbosacral region: Secondary | ICD-10-CM | POA: Diagnosis not present

## 2017-03-28 DIAGNOSIS — M9905 Segmental and somatic dysfunction of pelvic region: Secondary | ICD-10-CM | POA: Diagnosis not present

## 2017-03-28 DIAGNOSIS — M50322 Other cervical disc degeneration at C5-C6 level: Secondary | ICD-10-CM | POA: Diagnosis not present

## 2017-03-28 DIAGNOSIS — M9902 Segmental and somatic dysfunction of thoracic region: Secondary | ICD-10-CM | POA: Diagnosis not present

## 2017-03-28 DIAGNOSIS — M9904 Segmental and somatic dysfunction of sacral region: Secondary | ICD-10-CM | POA: Diagnosis not present

## 2017-03-28 DIAGNOSIS — M9903 Segmental and somatic dysfunction of lumbar region: Secondary | ICD-10-CM | POA: Diagnosis not present

## 2017-03-28 DIAGNOSIS — G603 Idiopathic progressive neuropathy: Secondary | ICD-10-CM | POA: Diagnosis not present

## 2017-03-28 DIAGNOSIS — M62830 Muscle spasm of back: Secondary | ICD-10-CM | POA: Diagnosis not present

## 2017-03-28 DIAGNOSIS — M791 Myalgia: Secondary | ICD-10-CM | POA: Diagnosis not present

## 2017-03-28 DIAGNOSIS — M461 Sacroiliitis, not elsewhere classified: Secondary | ICD-10-CM | POA: Diagnosis not present

## 2017-03-28 DIAGNOSIS — M5384 Other specified dorsopathies, thoracic region: Secondary | ICD-10-CM | POA: Diagnosis not present

## 2017-03-30 DIAGNOSIS — M62830 Muscle spasm of back: Secondary | ICD-10-CM | POA: Diagnosis not present

## 2017-03-30 DIAGNOSIS — M9903 Segmental and somatic dysfunction of lumbar region: Secondary | ICD-10-CM | POA: Diagnosis not present

## 2017-03-30 DIAGNOSIS — G603 Idiopathic progressive neuropathy: Secondary | ICD-10-CM | POA: Diagnosis not present

## 2017-03-30 DIAGNOSIS — M9901 Segmental and somatic dysfunction of cervical region: Secondary | ICD-10-CM | POA: Diagnosis not present

## 2017-03-30 DIAGNOSIS — M461 Sacroiliitis, not elsewhere classified: Secondary | ICD-10-CM | POA: Diagnosis not present

## 2017-03-30 DIAGNOSIS — M9905 Segmental and somatic dysfunction of pelvic region: Secondary | ICD-10-CM | POA: Diagnosis not present

## 2017-03-30 DIAGNOSIS — M9904 Segmental and somatic dysfunction of sacral region: Secondary | ICD-10-CM | POA: Diagnosis not present

## 2017-03-30 DIAGNOSIS — M50322 Other cervical disc degeneration at C5-C6 level: Secondary | ICD-10-CM | POA: Diagnosis not present

## 2017-03-30 DIAGNOSIS — M5384 Other specified dorsopathies, thoracic region: Secondary | ICD-10-CM | POA: Diagnosis not present

## 2017-03-30 DIAGNOSIS — M9902 Segmental and somatic dysfunction of thoracic region: Secondary | ICD-10-CM | POA: Diagnosis not present

## 2017-03-30 DIAGNOSIS — M5417 Radiculopathy, lumbosacral region: Secondary | ICD-10-CM | POA: Diagnosis not present

## 2017-03-30 DIAGNOSIS — M791 Myalgia: Secondary | ICD-10-CM | POA: Diagnosis not present

## 2017-03-31 DIAGNOSIS — M9905 Segmental and somatic dysfunction of pelvic region: Secondary | ICD-10-CM | POA: Diagnosis not present

## 2017-03-31 DIAGNOSIS — M5417 Radiculopathy, lumbosacral region: Secondary | ICD-10-CM | POA: Diagnosis not present

## 2017-03-31 DIAGNOSIS — M9904 Segmental and somatic dysfunction of sacral region: Secondary | ICD-10-CM | POA: Diagnosis not present

## 2017-03-31 DIAGNOSIS — M9903 Segmental and somatic dysfunction of lumbar region: Secondary | ICD-10-CM | POA: Diagnosis not present

## 2017-03-31 DIAGNOSIS — M461 Sacroiliitis, not elsewhere classified: Secondary | ICD-10-CM | POA: Diagnosis not present

## 2017-03-31 DIAGNOSIS — M9901 Segmental and somatic dysfunction of cervical region: Secondary | ICD-10-CM | POA: Diagnosis not present

## 2017-03-31 DIAGNOSIS — M5384 Other specified dorsopathies, thoracic region: Secondary | ICD-10-CM | POA: Diagnosis not present

## 2017-03-31 DIAGNOSIS — M50322 Other cervical disc degeneration at C5-C6 level: Secondary | ICD-10-CM | POA: Diagnosis not present

## 2017-03-31 DIAGNOSIS — M791 Myalgia: Secondary | ICD-10-CM | POA: Diagnosis not present

## 2017-03-31 DIAGNOSIS — M9902 Segmental and somatic dysfunction of thoracic region: Secondary | ICD-10-CM | POA: Diagnosis not present

## 2017-03-31 DIAGNOSIS — M62830 Muscle spasm of back: Secondary | ICD-10-CM | POA: Diagnosis not present

## 2017-03-31 DIAGNOSIS — G603 Idiopathic progressive neuropathy: Secondary | ICD-10-CM | POA: Diagnosis not present

## 2017-04-04 ENCOUNTER — Other Ambulatory Visit: Payer: Self-pay | Admitting: Family Medicine

## 2017-04-04 DIAGNOSIS — M9904 Segmental and somatic dysfunction of sacral region: Secondary | ICD-10-CM | POA: Diagnosis not present

## 2017-04-04 DIAGNOSIS — M62830 Muscle spasm of back: Secondary | ICD-10-CM | POA: Diagnosis not present

## 2017-04-04 DIAGNOSIS — M50322 Other cervical disc degeneration at C5-C6 level: Secondary | ICD-10-CM | POA: Diagnosis not present

## 2017-04-04 DIAGNOSIS — G603 Idiopathic progressive neuropathy: Secondary | ICD-10-CM | POA: Diagnosis not present

## 2017-04-04 DIAGNOSIS — M810 Age-related osteoporosis without current pathological fracture: Secondary | ICD-10-CM

## 2017-04-04 DIAGNOSIS — M5384 Other specified dorsopathies, thoracic region: Secondary | ICD-10-CM | POA: Diagnosis not present

## 2017-04-04 DIAGNOSIS — M9903 Segmental and somatic dysfunction of lumbar region: Secondary | ICD-10-CM | POA: Diagnosis not present

## 2017-04-04 DIAGNOSIS — M5417 Radiculopathy, lumbosacral region: Secondary | ICD-10-CM | POA: Diagnosis not present

## 2017-04-04 DIAGNOSIS — M791 Myalgia: Secondary | ICD-10-CM | POA: Diagnosis not present

## 2017-04-04 DIAGNOSIS — M461 Sacroiliitis, not elsewhere classified: Secondary | ICD-10-CM | POA: Diagnosis not present

## 2017-04-04 DIAGNOSIS — M9902 Segmental and somatic dysfunction of thoracic region: Secondary | ICD-10-CM | POA: Diagnosis not present

## 2017-04-04 DIAGNOSIS — M9905 Segmental and somatic dysfunction of pelvic region: Secondary | ICD-10-CM | POA: Diagnosis not present

## 2017-04-04 DIAGNOSIS — M9901 Segmental and somatic dysfunction of cervical region: Secondary | ICD-10-CM | POA: Diagnosis not present

## 2017-04-05 ENCOUNTER — Encounter: Payer: Self-pay | Admitting: Family Medicine

## 2017-04-06 DIAGNOSIS — M9903 Segmental and somatic dysfunction of lumbar region: Secondary | ICD-10-CM | POA: Diagnosis not present

## 2017-04-06 DIAGNOSIS — M9901 Segmental and somatic dysfunction of cervical region: Secondary | ICD-10-CM | POA: Diagnosis not present

## 2017-04-06 DIAGNOSIS — M9904 Segmental and somatic dysfunction of sacral region: Secondary | ICD-10-CM | POA: Diagnosis not present

## 2017-04-06 DIAGNOSIS — M9905 Segmental and somatic dysfunction of pelvic region: Secondary | ICD-10-CM | POA: Diagnosis not present

## 2017-04-06 DIAGNOSIS — M461 Sacroiliitis, not elsewhere classified: Secondary | ICD-10-CM | POA: Diagnosis not present

## 2017-04-06 DIAGNOSIS — M50322 Other cervical disc degeneration at C5-C6 level: Secondary | ICD-10-CM | POA: Diagnosis not present

## 2017-04-06 DIAGNOSIS — M5384 Other specified dorsopathies, thoracic region: Secondary | ICD-10-CM | POA: Diagnosis not present

## 2017-04-06 DIAGNOSIS — M5417 Radiculopathy, lumbosacral region: Secondary | ICD-10-CM | POA: Diagnosis not present

## 2017-04-06 DIAGNOSIS — M9902 Segmental and somatic dysfunction of thoracic region: Secondary | ICD-10-CM | POA: Diagnosis not present

## 2017-04-06 DIAGNOSIS — G603 Idiopathic progressive neuropathy: Secondary | ICD-10-CM | POA: Diagnosis not present

## 2017-04-06 DIAGNOSIS — M791 Myalgia: Secondary | ICD-10-CM | POA: Diagnosis not present

## 2017-04-06 DIAGNOSIS — M62830 Muscle spasm of back: Secondary | ICD-10-CM | POA: Diagnosis not present

## 2017-04-08 MED ORDER — OXYCODONE-ACETAMINOPHEN 10-325 MG PO TABS: ORAL_TABLET | ORAL | 0 refills | Status: DC

## 2017-04-11 DIAGNOSIS — M9902 Segmental and somatic dysfunction of thoracic region: Secondary | ICD-10-CM | POA: Diagnosis not present

## 2017-04-11 DIAGNOSIS — G603 Idiopathic progressive neuropathy: Secondary | ICD-10-CM | POA: Diagnosis not present

## 2017-04-11 DIAGNOSIS — M461 Sacroiliitis, not elsewhere classified: Secondary | ICD-10-CM | POA: Diagnosis not present

## 2017-04-11 DIAGNOSIS — M5417 Radiculopathy, lumbosacral region: Secondary | ICD-10-CM | POA: Diagnosis not present

## 2017-04-11 DIAGNOSIS — M5384 Other specified dorsopathies, thoracic region: Secondary | ICD-10-CM | POA: Diagnosis not present

## 2017-04-11 DIAGNOSIS — M791 Myalgia: Secondary | ICD-10-CM | POA: Diagnosis not present

## 2017-04-11 DIAGNOSIS — M9901 Segmental and somatic dysfunction of cervical region: Secondary | ICD-10-CM | POA: Diagnosis not present

## 2017-04-11 DIAGNOSIS — M9904 Segmental and somatic dysfunction of sacral region: Secondary | ICD-10-CM | POA: Diagnosis not present

## 2017-04-11 DIAGNOSIS — M50322 Other cervical disc degeneration at C5-C6 level: Secondary | ICD-10-CM | POA: Diagnosis not present

## 2017-04-11 DIAGNOSIS — M9903 Segmental and somatic dysfunction of lumbar region: Secondary | ICD-10-CM | POA: Diagnosis not present

## 2017-04-11 DIAGNOSIS — M62830 Muscle spasm of back: Secondary | ICD-10-CM | POA: Diagnosis not present

## 2017-04-11 DIAGNOSIS — M9905 Segmental and somatic dysfunction of pelvic region: Secondary | ICD-10-CM | POA: Diagnosis not present

## 2017-04-14 DIAGNOSIS — M9904 Segmental and somatic dysfunction of sacral region: Secondary | ICD-10-CM | POA: Diagnosis not present

## 2017-04-14 DIAGNOSIS — M62830 Muscle spasm of back: Secondary | ICD-10-CM | POA: Diagnosis not present

## 2017-04-14 DIAGNOSIS — G603 Idiopathic progressive neuropathy: Secondary | ICD-10-CM | POA: Diagnosis not present

## 2017-04-14 DIAGNOSIS — M9902 Segmental and somatic dysfunction of thoracic region: Secondary | ICD-10-CM | POA: Diagnosis not present

## 2017-04-14 DIAGNOSIS — M50322 Other cervical disc degeneration at C5-C6 level: Secondary | ICD-10-CM | POA: Diagnosis not present

## 2017-04-14 DIAGNOSIS — M9901 Segmental and somatic dysfunction of cervical region: Secondary | ICD-10-CM | POA: Diagnosis not present

## 2017-04-14 DIAGNOSIS — M5417 Radiculopathy, lumbosacral region: Secondary | ICD-10-CM | POA: Diagnosis not present

## 2017-04-14 DIAGNOSIS — M9903 Segmental and somatic dysfunction of lumbar region: Secondary | ICD-10-CM | POA: Diagnosis not present

## 2017-04-14 DIAGNOSIS — M461 Sacroiliitis, not elsewhere classified: Secondary | ICD-10-CM | POA: Diagnosis not present

## 2017-04-14 DIAGNOSIS — M791 Myalgia: Secondary | ICD-10-CM | POA: Diagnosis not present

## 2017-04-14 DIAGNOSIS — M9905 Segmental and somatic dysfunction of pelvic region: Secondary | ICD-10-CM | POA: Diagnosis not present

## 2017-04-14 DIAGNOSIS — M5384 Other specified dorsopathies, thoracic region: Secondary | ICD-10-CM | POA: Diagnosis not present

## 2017-04-18 DIAGNOSIS — M9905 Segmental and somatic dysfunction of pelvic region: Secondary | ICD-10-CM | POA: Diagnosis not present

## 2017-04-18 DIAGNOSIS — G603 Idiopathic progressive neuropathy: Secondary | ICD-10-CM | POA: Diagnosis not present

## 2017-04-18 DIAGNOSIS — M461 Sacroiliitis, not elsewhere classified: Secondary | ICD-10-CM | POA: Diagnosis not present

## 2017-04-18 DIAGNOSIS — M791 Myalgia: Secondary | ICD-10-CM | POA: Diagnosis not present

## 2017-04-18 DIAGNOSIS — M9902 Segmental and somatic dysfunction of thoracic region: Secondary | ICD-10-CM | POA: Diagnosis not present

## 2017-04-18 DIAGNOSIS — M50322 Other cervical disc degeneration at C5-C6 level: Secondary | ICD-10-CM | POA: Diagnosis not present

## 2017-04-18 DIAGNOSIS — M9904 Segmental and somatic dysfunction of sacral region: Secondary | ICD-10-CM | POA: Diagnosis not present

## 2017-04-18 DIAGNOSIS — M62830 Muscle spasm of back: Secondary | ICD-10-CM | POA: Diagnosis not present

## 2017-04-18 DIAGNOSIS — M9903 Segmental and somatic dysfunction of lumbar region: Secondary | ICD-10-CM | POA: Diagnosis not present

## 2017-04-18 DIAGNOSIS — M5384 Other specified dorsopathies, thoracic region: Secondary | ICD-10-CM | POA: Diagnosis not present

## 2017-04-18 DIAGNOSIS — M9901 Segmental and somatic dysfunction of cervical region: Secondary | ICD-10-CM | POA: Diagnosis not present

## 2017-04-18 DIAGNOSIS — M5417 Radiculopathy, lumbosacral region: Secondary | ICD-10-CM | POA: Diagnosis not present

## 2017-04-20 DIAGNOSIS — M50322 Other cervical disc degeneration at C5-C6 level: Secondary | ICD-10-CM | POA: Diagnosis not present

## 2017-04-20 DIAGNOSIS — M9904 Segmental and somatic dysfunction of sacral region: Secondary | ICD-10-CM | POA: Diagnosis not present

## 2017-04-20 DIAGNOSIS — M9901 Segmental and somatic dysfunction of cervical region: Secondary | ICD-10-CM | POA: Diagnosis not present

## 2017-04-20 DIAGNOSIS — M9902 Segmental and somatic dysfunction of thoracic region: Secondary | ICD-10-CM | POA: Diagnosis not present

## 2017-04-20 DIAGNOSIS — M9905 Segmental and somatic dysfunction of pelvic region: Secondary | ICD-10-CM | POA: Diagnosis not present

## 2017-04-20 DIAGNOSIS — M5384 Other specified dorsopathies, thoracic region: Secondary | ICD-10-CM | POA: Diagnosis not present

## 2017-04-20 DIAGNOSIS — M791 Myalgia: Secondary | ICD-10-CM | POA: Diagnosis not present

## 2017-04-20 DIAGNOSIS — M9903 Segmental and somatic dysfunction of lumbar region: Secondary | ICD-10-CM | POA: Diagnosis not present

## 2017-04-20 DIAGNOSIS — M461 Sacroiliitis, not elsewhere classified: Secondary | ICD-10-CM | POA: Diagnosis not present

## 2017-04-20 DIAGNOSIS — G603 Idiopathic progressive neuropathy: Secondary | ICD-10-CM | POA: Diagnosis not present

## 2017-04-20 DIAGNOSIS — M5417 Radiculopathy, lumbosacral region: Secondary | ICD-10-CM | POA: Diagnosis not present

## 2017-04-20 DIAGNOSIS — M62830 Muscle spasm of back: Secondary | ICD-10-CM | POA: Diagnosis not present

## 2017-04-21 ENCOUNTER — Encounter: Payer: Self-pay | Admitting: Family Medicine

## 2017-04-21 ENCOUNTER — Ambulatory Visit (INDEPENDENT_AMBULATORY_CARE_PROVIDER_SITE_OTHER): Payer: Medicare Other | Admitting: Family Medicine

## 2017-04-21 VITALS — BP 160/70 | HR 74 | Temp 98.4°F | Resp 18 | Ht <= 58 in | Wt 138.0 lb

## 2017-04-21 DIAGNOSIS — L720 Epidermal cyst: Secondary | ICD-10-CM

## 2017-04-21 MED ORDER — CEPHALEXIN 500 MG PO CAPS: 500.0000 mg | ORAL_CAPSULE | Freq: Four times a day (QID) | ORAL | 0 refills | Status: DC

## 2017-04-21 NOTE — Progress Notes (Signed)
Subjective:    Patient ID: Brianna Jacobs, female    DOB: 08-01-1930, 83 y.o.   MRN: [id number]  HPI  She has a mass on the posterior neck around the level of C4.  She states that his been there for years. It is approximately 2.5 cm x 3 cm in diameter. It is soft spongy and freely mobile. It appears to be an epidermoid cyst versus sebaceous cyst. She is requesting me to excise the cyst from her neck. She also has another small 4 mm very superficial cyst to the left of this. I simply anesthetized that and using sterile technique removed bluntly with a scalpel. It was a pedunculated cyst. Hemostasis was achieved with Drysol applied to the base. She will not be a charged for this excision of the small cyst. Past Medical History:  Diagnosis Date  . ADD (attention deficit disorder)   . Arthritis   . Asthma    occassional usage of inhalers  . Bowel obstruction ([place])   . Cancer [facility])    colon cancer, 16 yrs ago  . Cataract   . Depression   . GERD (gastroesophageal reflux disease)   . Hypertension   . Neuromuscular disorder ([facility])    neuropathy  . Osteoporosis   . Scoliosis   . Spinal stenosis    Past Surgical History:  Procedure Laterality Date  . ABDOMINAL HYSTERECTOMY  1972   partial  . COLONOSCOPY     2 yrs ago  . EYE SURGERY     cataracts bilat  . FRACTURE SURGERY     thoracic spine  . JOINT REPLACEMENT     bilat shoulders  . SIGMOID RESECTION / RECTOPEXY    . SPINAL CORD STIMULATOR INSERTION  11/18/2011   Procedure: LUMBAR SPINAL CORD STIMULATOR INSERTION;  Surgeon: Dahlia Bailiff, MD;  Location: [place];  Service: Orthopedics;  Laterality: N/A;  Thoracic 9 laminiotomy, SPINAL CORD STIMULATOR PLACEMENT thoracic 9- Thoracic 7   Current Outpatient Prescriptions on File Prior to Visit  Medication Sig Dispense Refill  . alendronate (FOSAMAX) 70 MG tablet TAKE 1 TABLET EVERY 7 DAYS WITH A FULL GLASS OF WATER ON AN EMPTY STOMACH 12 tablet 4  . aspirin 81 MG chewable tablet Chew 81  mg by mouth every other day.    Marland Kitchen BREO ELLIPTA 100-25 MCG/INH AEPB USE 1 INHALATION DAILY 180 each 1  . calcitonin, salmon, (MIACALCIN) 200 UNIT/ACT nasal spray Place 1 spray into alternate nostrils daily. 11.1 mL 3  . Calcium Carbonate-Vitamin D (CALCIUM + D) 600-200 MG-UNIT TABS Take 1 tablet by mouth daily.    . cetirizine (ZYRTEC) 10 MG tablet TAKE 1 TABLET DAILY 90 tablet 3  . cyanocobalamin 2000 MCG tablet Take 2,000 mcg by mouth daily.    Marland Kitchen losartan (COZAAR) 50 MG tablet TAKE 1 TABLET DAILY 90 tablet 2  . Melatonin 10 MG TABS Take 1 tablet by mouth at bedtime.    . Omega-3 Fatty Acids (FISH OIL) 1200 MG CPDR Take 4 capsules by mouth daily.    Marland Kitchen oxyCODONE-acetaminophen (PERCOCET) 10-325 MG tablet .5 - 1 tab po Q 6hours 90 tablet 0  . pantoprazole (PROTONIX) 40 MG tablet TAKE 1 TABLET DAILY 90 tablet 2  . Plecanatide (TRULANCE) 3 MG TABS Take 3 mg by mouth daily.    Marland Kitchen Propylene Glycol (SYSTANE BALANCE OP) Apply 1 drop to eye as needed. For dry eyes    . ranitidine (ZANTAC) 150 MG tablet TAKE 1 TABLET TWICE A DAY  180 tablet 3  . sucralfate (CARAFATE) 1 g tablet Take 1 tablet (1 g total) by mouth 4 (four) times daily -  with meals and at bedtime. 450 tablet 3  . Triamcinolone Acetonide (TRIAMCINOLONE 0.1 % CREAM : EUCERIN) CREA Apply 1 application topically 2 (two) times daily as needed. 1 each 2  . UCERIS 9 MG TB24 Take 1 tablet by mouth daily.    Marland Kitchen XIIDRA 5 % SOLN Place 1 drop into both eyes 2 (two) times daily.      No current facility-administered medications on file prior to visit.    Allergies  Allergen Reactions  . Cymbalta [Duloxetine Hcl] Other (See Comments)    Makes me lethargic  . Lyrica [Pregabalin] Other (See Comments)    Makes me lethargic  . Ranitidine Diarrhea  . Celebrex [Celecoxib] Rash  . Naprosyn [Naproxen] Rash   Social History   Social History  . Marital status: Married    Spouse name: N/A  . Number of children: 3  . Years of education: N/A    Occupational History  . Not on file.   Social History Main Topics  . Smoking status: Former Smoker    Quit date: 10/11/1977  . Smokeless tobacco: Never Used  . Alcohol use No  . Drug use: Yes    Types: Oxycodone  . Sexual activity: Not on file   Other Topics Concern  . Not on file   Social History Narrative   Lives at home with husband.      Review of Systems  All other systems reviewed and are negative.      Objective:   Physical Exam  Neck:    Cardiovascular: Normal rate, regular rhythm and normal heart sounds.   Pulmonary/Chest: Effort normal and breath sounds normal.  Vitals reviewed.  See hpi.       Assessment & Plan:  Epidermal cyst of neck  The cyst labeled 1 on the diagram was anesthetized with 0.1% lidocaine with epinephrine. Patient was unable to lie on her belly. Therefore she was placed in the left lateral decubitus position with her neck flexed as far as possible. The area was then prepped and draped in sterile fashion. A 3 x 3 cm elliptical excision was then performed around the cyst and the cyst was removed in its entirety down to the subcutaneous fascia using a scalpel. The dermal edges were then approximated using 2 simple interrupted Vicryl sutures internally closing the potential space.  During this process, the patient flexed her neck and her hair contaminated the operative site. The operative site was then cleaned again with Betadine and the skin edges were approximated using 5 simple interrupted 3-0 ethilon sutures.  Due to the potential contamination despite my best efforts of keeping the site sterile, I will start the patient on Keflex 500 mg by mouth 3 times a day for 7 days. Stitches out in 7 days.

## 2017-04-28 ENCOUNTER — Ambulatory Visit: Payer: Self-pay | Admitting: Family Medicine

## 2017-04-29 ENCOUNTER — Ambulatory Visit (INDEPENDENT_AMBULATORY_CARE_PROVIDER_SITE_OTHER): Payer: Medicare Other | Admitting: Family Medicine

## 2017-04-29 ENCOUNTER — Encounter: Payer: Self-pay | Admitting: Family Medicine

## 2017-04-29 VITALS — BP 120/70 | HR 84 | Temp 98.6°F | Resp 18 | Ht <= 58 in | Wt 135.0 lb

## 2017-04-29 DIAGNOSIS — Z4802 Encounter for removal of sutures: Secondary | ICD-10-CM

## 2017-04-29 MED ORDER — LOSARTAN POTASSIUM 50 MG PO TABS: 50.0000 mg | ORAL_TABLET | Freq: Every day | ORAL | 2 refills | Status: DC

## 2017-04-29 NOTE — Progress Notes (Signed)
Subjective:    Patient ID: Brianna Jacobs, female    DOB: 1930-06-27, 83 y.o.   MRN: [id number]  HPI 04/21/17 She has a mass on the posterior neck around the level of C4.  She states that his been there for years. It is approximately 2.5 cm x 3 cm in diameter. It is soft spongy and freely mobile. It appears to be an epidermoid cyst versus sebaceous cyst. She is requesting me to excise the cyst from her neck. She also has another small 4 mm very superficial cyst to the left of this. I simply anesthetized that and using sterile technique removed bluntly with a scalpel. It was a pedunculated cyst. Hemostasis was achieved with Drysol applied to the base. She will not be a charged for this excision of the small cyst.  At that time, my plan was: The cyst labeled 1 on the diagram was anesthetized with 0.1% lidocaine with epinephrine. Patient was unable to lie on her belly. Therefore she was placed in the left lateral decubitus position with her neck flexed as far as possible. The area was then prepped and draped in sterile fashion. A 3 x 3 cm elliptical excision was then performed around the cyst and the cyst was removed in its entirety down to the subcutaneous fascia using a scalpel. The dermal edges were then approximated using 2 simple interrupted Vicryl sutures internally closing the potential space.  During this process, the patient flexed her neck and her hair contaminated the operative site. The operative site was then cleaned again with Betadine and the skin edges were approximated using 5 simple interrupted 3-0 ethilon sutures.  Due to the potential contamination despite my best efforts of keeping the site sterile, I will start the patient on Keflex 500 mg by mouth 3 times a day for 7 days. Stitches out in 7 days.  04/29/17 Here for suture removal.  Wound is well healed, without evidence of cellulitis.  Past Medical History:  Diagnosis Date  . ADD (attention deficit disorder)   . Arthritis   . Asthma     occassional usage of inhalers  . Bowel obstruction ([place])   . Cancer [facility])    colon cancer, 16 yrs ago  . Cataract   . Depression   . GERD (gastroesophageal reflux disease)   . Hypertension   . Neuromuscular disorder ([facility])    neuropathy  . Osteoporosis   . Scoliosis   . Spinal stenosis    Past Surgical History:  Procedure Laterality Date  . ABDOMINAL HYSTERECTOMY  1972   partial  . COLONOSCOPY     2 yrs ago  . EYE SURGERY     cataracts bilat  . FRACTURE SURGERY     thoracic spine  . JOINT REPLACEMENT     bilat shoulders  . SIGMOID RESECTION / RECTOPEXY    . SPINAL CORD STIMULATOR INSERTION  11/18/2011   Procedure: LUMBAR SPINAL CORD STIMULATOR INSERTION;  Surgeon: Dahlia Bailiff, MD;  Location: [place];  Service: Orthopedics;  Laterality: N/A;  Thoracic 9 laminiotomy, SPINAL CORD STIMULATOR PLACEMENT thoracic 9- Thoracic 7   Current Outpatient Prescriptions on File Prior to Visit  Medication Sig Dispense Refill  . alendronate (FOSAMAX) 70 MG tablet TAKE 1 TABLET EVERY 7 DAYS WITH A FULL GLASS OF WATER ON AN EMPTY STOMACH 12 tablet 4  . aspirin 81 MG chewable tablet Chew 81 mg by mouth every other day.    Marland Kitchen BREO ELLIPTA 100-25 MCG/INH AEPB USE 1 INHALATION  DAILY 180 each 1  . calcitonin, salmon, (MIACALCIN) 200 UNIT/ACT nasal spray Place 1 spray into alternate nostrils daily. 11.1 mL 3  . Calcium Carbonate-Vitamin D (CALCIUM + D) 600-200 MG-UNIT TABS Take 1 tablet by mouth daily.    . cephALEXin (KEFLEX) 500 MG capsule Take 1 capsule (500 mg total) by mouth 4 (four) times daily. 21 capsule 0  . cetirizine (ZYRTEC) 10 MG tablet TAKE 1 TABLET DAILY 90 tablet 3  . cyanocobalamin 2000 MCG tablet Take 2,000 mcg by mouth daily.    Marland Kitchen losartan (COZAAR) 50 MG tablet TAKE 1 TABLET DAILY 90 tablet 2  . Melatonin 10 MG TABS Take 1 tablet by mouth at bedtime.    . Omega-3 Fatty Acids (FISH OIL) 1200 MG CPDR Take 4 capsules by mouth daily.    Marland Kitchen oxyCODONE-acetaminophen (PERCOCET) 10-325 MG  tablet .5 - 1 tab po Q 6hours 90 tablet 0  . pantoprazole (PROTONIX) 40 MG tablet TAKE 1 TABLET DAILY 90 tablet 2  . Plecanatide (TRULANCE) 3 MG TABS Take 3 mg by mouth daily.    Marland Kitchen Propylene Glycol (SYSTANE BALANCE OP) Apply 1 drop to eye as needed. For dry eyes    . ranitidine (ZANTAC) 150 MG tablet TAKE 1 TABLET TWICE A DAY 180 tablet 3  . sucralfate (CARAFATE) 1 g tablet Take 1 tablet (1 g total) by mouth 4 (four) times daily -  with meals and at bedtime. 450 tablet 3  . Triamcinolone Acetonide (TRIAMCINOLONE 0.1 % CREAM : EUCERIN) CREA Apply 1 application topically 2 (two) times daily as needed. 1 each 2  . UCERIS 9 MG TB24 Take 1 tablet by mouth daily.    Marland Kitchen XIIDRA 5 % SOLN Place 1 drop into both eyes 2 (two) times daily.      No current facility-administered medications on file prior to visit.    Allergies  Allergen Reactions  . Cymbalta [Duloxetine Hcl] Other (See Comments)    Makes me lethargic  . Lyrica [Pregabalin] Other (See Comments)    Makes me lethargic  . Ranitidine Diarrhea  . Celebrex [Celecoxib] Rash  . Naprosyn [Naproxen] Rash   Social History   Social History  . Marital status: Married    Spouse name: N/A  . Number of children: 3  . Years of education: N/A   Occupational History  . Not on file.   Social History Main Topics  . Smoking status: Former Smoker    Quit date: 10/11/1977  . Smokeless tobacco: Never Used  . Alcohol use No  . Drug use: Yes    Types: Oxycodone  . Sexual activity: Not on file   Other Topics Concern  . Not on file   Social History Narrative   Lives at home with husband.      Review of Systems  All other systems reviewed and are negative.      Objective:   Physical Exam  Cardiovascular: Normal rate, regular rhythm and normal heart sounds.   Pulmonary/Chest: Effort normal and breath sounds normal.  Vitals reviewed.  See hpi.       Assessment & Plan:  All 5 stitches removed without difficulty. Wound is well-healed  with no evidence of cellulitis. Steri-Strips were applied to reinforce the wound for the next week. Wound care was discussed

## 2017-05-02 DIAGNOSIS — M5384 Other specified dorsopathies, thoracic region: Secondary | ICD-10-CM | POA: Diagnosis not present

## 2017-05-02 DIAGNOSIS — M50322 Other cervical disc degeneration at C5-C6 level: Secondary | ICD-10-CM | POA: Diagnosis not present

## 2017-05-02 DIAGNOSIS — M791 Myalgia: Secondary | ICD-10-CM | POA: Diagnosis not present

## 2017-05-02 DIAGNOSIS — M9901 Segmental and somatic dysfunction of cervical region: Secondary | ICD-10-CM | POA: Diagnosis not present

## 2017-05-02 DIAGNOSIS — M9903 Segmental and somatic dysfunction of lumbar region: Secondary | ICD-10-CM | POA: Diagnosis not present

## 2017-05-02 DIAGNOSIS — M5417 Radiculopathy, lumbosacral region: Secondary | ICD-10-CM | POA: Diagnosis not present

## 2017-05-02 DIAGNOSIS — M9904 Segmental and somatic dysfunction of sacral region: Secondary | ICD-10-CM | POA: Diagnosis not present

## 2017-05-02 DIAGNOSIS — M62830 Muscle spasm of back: Secondary | ICD-10-CM | POA: Diagnosis not present

## 2017-05-02 DIAGNOSIS — M9902 Segmental and somatic dysfunction of thoracic region: Secondary | ICD-10-CM | POA: Diagnosis not present

## 2017-05-02 DIAGNOSIS — M461 Sacroiliitis, not elsewhere classified: Secondary | ICD-10-CM | POA: Diagnosis not present

## 2017-05-02 DIAGNOSIS — M9905 Segmental and somatic dysfunction of pelvic region: Secondary | ICD-10-CM | POA: Diagnosis not present

## 2017-05-02 DIAGNOSIS — G603 Idiopathic progressive neuropathy: Secondary | ICD-10-CM | POA: Diagnosis not present

## 2017-05-04 DIAGNOSIS — M9903 Segmental and somatic dysfunction of lumbar region: Secondary | ICD-10-CM | POA: Diagnosis not present

## 2017-05-04 DIAGNOSIS — G603 Idiopathic progressive neuropathy: Secondary | ICD-10-CM | POA: Diagnosis not present

## 2017-05-04 DIAGNOSIS — M9905 Segmental and somatic dysfunction of pelvic region: Secondary | ICD-10-CM | POA: Diagnosis not present

## 2017-05-04 DIAGNOSIS — M62830 Muscle spasm of back: Secondary | ICD-10-CM | POA: Diagnosis not present

## 2017-05-04 DIAGNOSIS — M50322 Other cervical disc degeneration at C5-C6 level: Secondary | ICD-10-CM | POA: Diagnosis not present

## 2017-05-04 DIAGNOSIS — M461 Sacroiliitis, not elsewhere classified: Secondary | ICD-10-CM | POA: Diagnosis not present

## 2017-05-04 DIAGNOSIS — M9902 Segmental and somatic dysfunction of thoracic region: Secondary | ICD-10-CM | POA: Diagnosis not present

## 2017-05-04 DIAGNOSIS — M9901 Segmental and somatic dysfunction of cervical region: Secondary | ICD-10-CM | POA: Diagnosis not present

## 2017-05-04 DIAGNOSIS — M791 Myalgia: Secondary | ICD-10-CM | POA: Diagnosis not present

## 2017-05-04 DIAGNOSIS — M5417 Radiculopathy, lumbosacral region: Secondary | ICD-10-CM | POA: Diagnosis not present

## 2017-05-04 DIAGNOSIS — M5384 Other specified dorsopathies, thoracic region: Secondary | ICD-10-CM | POA: Diagnosis not present

## 2017-05-04 DIAGNOSIS — M9904 Segmental and somatic dysfunction of sacral region: Secondary | ICD-10-CM | POA: Diagnosis not present

## 2017-05-09 DIAGNOSIS — M461 Sacroiliitis, not elsewhere classified: Secondary | ICD-10-CM | POA: Diagnosis not present

## 2017-05-09 DIAGNOSIS — M9902 Segmental and somatic dysfunction of thoracic region: Secondary | ICD-10-CM | POA: Diagnosis not present

## 2017-05-09 DIAGNOSIS — M9903 Segmental and somatic dysfunction of lumbar region: Secondary | ICD-10-CM | POA: Diagnosis not present

## 2017-05-09 DIAGNOSIS — G603 Idiopathic progressive neuropathy: Secondary | ICD-10-CM | POA: Diagnosis not present

## 2017-05-09 DIAGNOSIS — M5384 Other specified dorsopathies, thoracic region: Secondary | ICD-10-CM | POA: Diagnosis not present

## 2017-05-09 DIAGNOSIS — M62830 Muscle spasm of back: Secondary | ICD-10-CM | POA: Diagnosis not present

## 2017-05-09 DIAGNOSIS — M791 Myalgia: Secondary | ICD-10-CM | POA: Diagnosis not present

## 2017-05-09 DIAGNOSIS — M9901 Segmental and somatic dysfunction of cervical region: Secondary | ICD-10-CM | POA: Diagnosis not present

## 2017-05-09 DIAGNOSIS — M5417 Radiculopathy, lumbosacral region: Secondary | ICD-10-CM | POA: Diagnosis not present

## 2017-05-09 DIAGNOSIS — M9905 Segmental and somatic dysfunction of pelvic region: Secondary | ICD-10-CM | POA: Diagnosis not present

## 2017-05-09 DIAGNOSIS — M50322 Other cervical disc degeneration at C5-C6 level: Secondary | ICD-10-CM | POA: Diagnosis not present

## 2017-05-09 DIAGNOSIS — M9904 Segmental and somatic dysfunction of sacral region: Secondary | ICD-10-CM | POA: Diagnosis not present

## 2017-05-11 DIAGNOSIS — M461 Sacroiliitis, not elsewhere classified: Secondary | ICD-10-CM | POA: Diagnosis not present

## 2017-05-11 DIAGNOSIS — M9901 Segmental and somatic dysfunction of cervical region: Secondary | ICD-10-CM | POA: Diagnosis not present

## 2017-05-11 DIAGNOSIS — G603 Idiopathic progressive neuropathy: Secondary | ICD-10-CM | POA: Diagnosis not present

## 2017-05-11 DIAGNOSIS — M9905 Segmental and somatic dysfunction of pelvic region: Secondary | ICD-10-CM | POA: Diagnosis not present

## 2017-05-11 DIAGNOSIS — M9903 Segmental and somatic dysfunction of lumbar region: Secondary | ICD-10-CM | POA: Diagnosis not present

## 2017-05-11 DIAGNOSIS — M9904 Segmental and somatic dysfunction of sacral region: Secondary | ICD-10-CM | POA: Diagnosis not present

## 2017-05-11 DIAGNOSIS — M62830 Muscle spasm of back: Secondary | ICD-10-CM | POA: Diagnosis not present

## 2017-05-11 DIAGNOSIS — M5384 Other specified dorsopathies, thoracic region: Secondary | ICD-10-CM | POA: Diagnosis not present

## 2017-05-11 DIAGNOSIS — M50322 Other cervical disc degeneration at C5-C6 level: Secondary | ICD-10-CM | POA: Diagnosis not present

## 2017-05-11 DIAGNOSIS — M9902 Segmental and somatic dysfunction of thoracic region: Secondary | ICD-10-CM | POA: Diagnosis not present

## 2017-05-11 DIAGNOSIS — M791 Myalgia: Secondary | ICD-10-CM | POA: Diagnosis not present

## 2017-05-11 DIAGNOSIS — M5417 Radiculopathy, lumbosacral region: Secondary | ICD-10-CM | POA: Diagnosis not present

## 2017-05-18 DIAGNOSIS — M62830 Muscle spasm of back: Secondary | ICD-10-CM | POA: Diagnosis not present

## 2017-05-18 DIAGNOSIS — M9901 Segmental and somatic dysfunction of cervical region: Secondary | ICD-10-CM | POA: Diagnosis not present

## 2017-05-18 DIAGNOSIS — M9903 Segmental and somatic dysfunction of lumbar region: Secondary | ICD-10-CM | POA: Diagnosis not present

## 2017-05-18 DIAGNOSIS — M791 Myalgia: Secondary | ICD-10-CM | POA: Diagnosis not present

## 2017-05-18 DIAGNOSIS — M5417 Radiculopathy, lumbosacral region: Secondary | ICD-10-CM | POA: Diagnosis not present

## 2017-05-18 DIAGNOSIS — M9904 Segmental and somatic dysfunction of sacral region: Secondary | ICD-10-CM | POA: Diagnosis not present

## 2017-05-18 DIAGNOSIS — G603 Idiopathic progressive neuropathy: Secondary | ICD-10-CM | POA: Diagnosis not present

## 2017-05-18 DIAGNOSIS — M9905 Segmental and somatic dysfunction of pelvic region: Secondary | ICD-10-CM | POA: Diagnosis not present

## 2017-05-18 DIAGNOSIS — M5384 Other specified dorsopathies, thoracic region: Secondary | ICD-10-CM | POA: Diagnosis not present

## 2017-05-18 DIAGNOSIS — M9902 Segmental and somatic dysfunction of thoracic region: Secondary | ICD-10-CM | POA: Diagnosis not present

## 2017-05-18 DIAGNOSIS — M461 Sacroiliitis, not elsewhere classified: Secondary | ICD-10-CM | POA: Diagnosis not present

## 2017-05-18 DIAGNOSIS — M50322 Other cervical disc degeneration at C5-C6 level: Secondary | ICD-10-CM | POA: Diagnosis not present

## 2017-05-23 DIAGNOSIS — M9904 Segmental and somatic dysfunction of sacral region: Secondary | ICD-10-CM | POA: Diagnosis not present

## 2017-05-23 DIAGNOSIS — M5384 Other specified dorsopathies, thoracic region: Secondary | ICD-10-CM | POA: Diagnosis not present

## 2017-05-23 DIAGNOSIS — M62830 Muscle spasm of back: Secondary | ICD-10-CM | POA: Diagnosis not present

## 2017-05-23 DIAGNOSIS — M461 Sacroiliitis, not elsewhere classified: Secondary | ICD-10-CM | POA: Diagnosis not present

## 2017-05-23 DIAGNOSIS — M5417 Radiculopathy, lumbosacral region: Secondary | ICD-10-CM | POA: Diagnosis not present

## 2017-05-23 DIAGNOSIS — M9903 Segmental and somatic dysfunction of lumbar region: Secondary | ICD-10-CM | POA: Diagnosis not present

## 2017-05-23 DIAGNOSIS — M50322 Other cervical disc degeneration at C5-C6 level: Secondary | ICD-10-CM | POA: Diagnosis not present

## 2017-05-23 DIAGNOSIS — G603 Idiopathic progressive neuropathy: Secondary | ICD-10-CM | POA: Diagnosis not present

## 2017-05-23 DIAGNOSIS — M9905 Segmental and somatic dysfunction of pelvic region: Secondary | ICD-10-CM | POA: Diagnosis not present

## 2017-05-23 DIAGNOSIS — M791 Myalgia: Secondary | ICD-10-CM | POA: Diagnosis not present

## 2017-05-23 DIAGNOSIS — M9902 Segmental and somatic dysfunction of thoracic region: Secondary | ICD-10-CM | POA: Diagnosis not present

## 2017-05-23 DIAGNOSIS — M9901 Segmental and somatic dysfunction of cervical region: Secondary | ICD-10-CM | POA: Diagnosis not present

## 2017-06-06 DIAGNOSIS — M9905 Segmental and somatic dysfunction of pelvic region: Secondary | ICD-10-CM | POA: Diagnosis not present

## 2017-06-06 DIAGNOSIS — M791 Myalgia: Secondary | ICD-10-CM | POA: Diagnosis not present

## 2017-06-06 DIAGNOSIS — G603 Idiopathic progressive neuropathy: Secondary | ICD-10-CM | POA: Diagnosis not present

## 2017-06-06 DIAGNOSIS — M9904 Segmental and somatic dysfunction of sacral region: Secondary | ICD-10-CM | POA: Diagnosis not present

## 2017-06-06 DIAGNOSIS — M50322 Other cervical disc degeneration at C5-C6 level: Secondary | ICD-10-CM | POA: Diagnosis not present

## 2017-06-06 DIAGNOSIS — M5384 Other specified dorsopathies, thoracic region: Secondary | ICD-10-CM | POA: Diagnosis not present

## 2017-06-06 DIAGNOSIS — M9901 Segmental and somatic dysfunction of cervical region: Secondary | ICD-10-CM | POA: Diagnosis not present

## 2017-06-06 DIAGNOSIS — M461 Sacroiliitis, not elsewhere classified: Secondary | ICD-10-CM | POA: Diagnosis not present

## 2017-06-06 DIAGNOSIS — M9903 Segmental and somatic dysfunction of lumbar region: Secondary | ICD-10-CM | POA: Diagnosis not present

## 2017-06-06 DIAGNOSIS — M5417 Radiculopathy, lumbosacral region: Secondary | ICD-10-CM | POA: Diagnosis not present

## 2017-06-06 DIAGNOSIS — M62830 Muscle spasm of back: Secondary | ICD-10-CM | POA: Diagnosis not present

## 2017-06-06 DIAGNOSIS — M9902 Segmental and somatic dysfunction of thoracic region: Secondary | ICD-10-CM | POA: Diagnosis not present

## 2017-06-10 DIAGNOSIS — H1132 Conjunctival hemorrhage, left eye: Secondary | ICD-10-CM | POA: Diagnosis not present

## 2017-06-20 ENCOUNTER — Telehealth: Payer: Self-pay | Admitting: *Deleted

## 2017-06-20 NOTE — Telephone Encounter (Signed)
Received e-mail from patient husband.   Requested refill on Oxycodone.   Ok to refill??  Last office visit 04/29/2017.  Last refill 04/08/2017.

## 2017-06-21 ENCOUNTER — Ambulatory Visit (INDEPENDENT_AMBULATORY_CARE_PROVIDER_SITE_OTHER): Payer: Medicare Other | Admitting: Family Medicine

## 2017-06-21 DIAGNOSIS — Z23 Encounter for immunization: Secondary | ICD-10-CM | POA: Diagnosis not present

## 2017-06-21 MED ORDER — RANITIDINE HCL 150 MG PO TABS: 150.0000 mg | ORAL_TABLET | Freq: Two times a day (BID) | ORAL | 3 refills | Status: DC

## 2017-06-21 MED ORDER — OXYCODONE-ACETAMINOPHEN 10-325 MG PO TABS: ORAL_TABLET | ORAL | 0 refills | Status: DC

## 2017-06-21 NOTE — Telephone Encounter (Signed)
ok 

## 2017-06-21 NOTE — Telephone Encounter (Signed)
Patient aware per MyChart.   

## 2017-06-23 ENCOUNTER — Other Ambulatory Visit: Payer: Self-pay | Admitting: Family Medicine

## 2017-08-01 ENCOUNTER — Encounter: Payer: Self-pay | Admitting: Family Medicine

## 2017-08-05 ENCOUNTER — Encounter: Payer: Self-pay | Admitting: Family Medicine

## 2017-08-05 ENCOUNTER — Ambulatory Visit (INDEPENDENT_AMBULATORY_CARE_PROVIDER_SITE_OTHER): Payer: Medicare Other | Admitting: Family Medicine

## 2017-08-05 VITALS — BP 150/74 | HR 82 | Temp 98.2°F | Resp 16 | Ht <= 58 in | Wt 137.0 lb

## 2017-08-05 DIAGNOSIS — M65332 Trigger finger, left middle finger: Secondary | ICD-10-CM

## 2017-08-05 DIAGNOSIS — S29012A Strain of muscle and tendon of back wall of thorax, initial encounter: Secondary | ICD-10-CM | POA: Diagnosis not present

## 2017-08-05 DIAGNOSIS — J069 Acute upper respiratory infection, unspecified: Secondary | ICD-10-CM

## 2017-08-05 NOTE — Progress Notes (Signed)
Subjective:    Patient ID: Brianna Jacobs, female    DOB: 01-Mar-1930, 83 y.o.   MRN: [id number]  HPI 01/05/16 Had MRI 2011 of lumbar spine:  T11-T12: Retropulsion produces mild central stenosis. Flattening the ventral aspect of the thoracic cord. T12-L1: Desiccated disc with broad-based posterior protrusion. Mild bilateral facet hypertrophy and ligamentum flavum redundancy without significant stenosis. L1-L2: Disc desiccation with broad-based posterior disc protrusion. Mild central stenosis. Mild bilateral facet hypertrophy. Foramina and lateral recesses patent. L2-L3: Severely degenerated and desiccated disc with degenerative endplate changes. Right greater than left facet hypertrophy. Central canal and lateral recesses patent. Moderate right foraminal stenosis associated with scoliosis and osteophytes. Right lateral disc protrusion and osteophyte could affect the exiting right L2 nerve root. Lateral protrusion and osteophyte are new compared to prior exam. L3-L4: Degenerated disc. Left eccentric broad-based posterior disc protrusion extending into the left neural foramen. Bilateral facet hypertrophy with moderate central stenosis. Left lateral recess stenosis. Moderate right foraminal stenosis with mild left foraminal stenosis. L4-L5: Severely degenerated and desiccated disc with endplate changes. Severe bilateral facet arthrosis and ligamentum flavum redundancy. Left foraminal disc protrusion and severe left foraminal stenosis with compression of the exiting left L4 nerve. Left greater than right bilateral subarticular lateral recess stenosis. Minimal right foraminal stenosis. Mild to moderate central stenosis. L5-S1: Grade 1 anterolisthesis with uncoverage of the disc and a broad-based protrusion. Bilateral lateral recess stenosis. Severe bilateral facet arthrosis with bilateral facet effusions. Mild central stenosis. Severe bilateral foraminal  stenosis with compression of both L5 nerve roots.  Patient has a long-standing history of lower back problems as evidenced in the MRI report from 2011 listed above. She also has a history of 2 separate vertebral fractures in the thoracic spine show normal x-ray of the thoracic spine in 2013. However this weekend, the patient stumbled and fell in a restaurant. Shortly thereafter, she developed severe sharp stabbing pain up and down the thoracic spine. The pain is primarily located in the paraspinal areas on either side of thoracic spine. She denies any new numbness or weakness in the right leg. However there is some new numbness and tingling located on the lateral aspect of the left thigh. She has chronic weakness in both legs and chronic neuropathy in both legs. There is no tenderness to palpation over the spinous processes of the thoracic spine or in the thoracic spine paraspinal muscles however she states the pain is most intense it has been in years.  At that time, my plan was: Differential diagnosis includes herniated disc in the thoracic spine after a fall versus vertebral fracture in the thoracic spine after a fall. I like the patient to go immediately for an x-ray of her thoracic spine. If there is a vertebral fracture, we can discuss possible kyphoplasty versus pain management. However if there is no evidence of a new vertebral fracture, I will treat the patient empirically as possibly having a herniated disc with a prednisone taper pack. I will await the results of the x-ray of the thoracic spine.  01/12/16 Patient is here today for follow-up. The pain in the center of her back is intensifying. It is 9 on a scale of 10. She is unable to walk without a walker. She has fallen 4 separate times due to the pain in her back and the worsening weakness in both legs. She also reports worsening numbness in both legs. The pain is primarily between the levels of T7 and T12. She is able to stand from  a seated  position with assistance. However she screams in pain when she does this. X-rays of the thoracic spine reveal degenerative disc disease, levoscoliosis, and stable old compression fractures with no acute changes. Prednisone taper pack was tried without relief.  At that time, my plan was: I'm concerned the patient may have spinal stenosis developing in the thoracic spine. She is unable to have an MRI of the thoracic spine due to the presence of a spinal cord stimulator. Therefore I'll schedule the patient for CT of the thoracic spine to characterize further. Meanwhile she can use fentanyl 25 g per hour every 72 hours and use oxycodone for breakthrough pain.  02/20/16 CT revealed: 1. New, from 2015, T7 compression fracture with minimal height loss and no retropulsion. An acute fracture line is not seen but given the history and subtle perispinal edema favor subacute timing. 2. Remote T9, T10, T12, and L1 compression fractures as described.  Patient underwent T7 kyphoplasty and vertebroplasty 4/25.  I also recommended fosamax.  Patient states the pain is worse. She now screams in pain whenever she moves to get out of a chair. She denies any paralysis in her legs. She does state that the numbness in her legs is getting worse. Weakness in her legs is not getting worse. She denies any bowel or bladder incontinence. She denies any saddle anesthesia. She denies any shortness of breath although she states it hurts to take a deep breath in. She also complains of pain and difficulty and dysphasia/trouble swallowing. She blames this on the addition of Fosamax.  At that time, my plan was: Very complicated situation. I explained to the patient I will focus on the pain first try to manage and control her pain and then see how many of her residual symptoms including dysphagia and trouble breathing persist. Increase fentanyl from 25 g per hour to 75 g per hour. Continue use oxycodone 10/325 one every 6 hours as needed.  Add calcitonin nasal spray daily for her acute thoracic compression fracture. Recheck in one week. If her pain is better and she continues to have dysphasia discontinue Fosamax and begin GI workup. I would replace Fosamax with prolia. I see no evidence of an infection, cauda equina syndrome, or pulmonary embolism  03/18/16 Patient presents today complaining of severe pain in her back. She does think the fentanyl is helping. She is using Percocet 3-4 times a day. She uses a half a tablet to a whole tablet each time. She never started taking calcitonin due to concern regarding the side effects. She also reports worsening neuropathy in her right leg. Burning and stinging pain will wake her up at night radiating from her right thigh down to her right foot. The weakness in her legs is not getting worse. She denies any symptoms of cauda equina syndrome.  At that  Time, my plan was: Continue fentanyl 75 g every 72 hours. She will continue to use Percocet 10/325 1/2-1 tablet every 6 hours as needed for pain. She has tried and failed gabapentin, Lyrica, Cymbalta, and amitriptyline for neuropathic pain. The only other option would know to try would be trokendi off label.  At the present time she is not interested in trying this. We will discontinue calcitonin as she is afraid of the side effects. The dysphagia is not getting worse and therefore we will discontinue the Carafate. Since the dysphagia is not getting worse, I will continue to have her on Fosamax given the multiple vertebral fractures she is having.  04/15/16 Patient is here today complaining of severe mid back pain all throughout her thoracic spine. Patient reports severe sharp pain with rotation of her trunk, with flexion, with extension of her spine. She has a difficult time getting up from a seated position. She has a difficult time sitting down. She has difficult time riding in a car. Any movement causes her to scream in pain. Fentanyl patches and  Percocet every 6 hours is not providing her significant relief. She never started the calcitonin nasal spray.  AT that time, my plan was: Given her advanced age, I am hesitant to increase her narcotics higher due to potential side effects.  I will rule out other potential causes of back pain by obtaining an SPEP and UPEP although I believe her pain is most likely due to the compression fractures in her thoracic spine coupled with her lumbar degenerative disc disease and mild to moderate spinal stenosis.  Therefore I will consult the pain clinic to better try to manage her pain as I have been unsuccessful.  05/25/16 Patient has yet to see the pain clinic. Fortunately however it seems like her pain is improving. She is still not using calcitonin. She is here today because she would like to go over all the medication she is taking. I reviewed with her the med list in detail. I explained to her the calcitonin is simply for bone pain. She can certainly stop that and discard the medication if she does not feel it's beneficial. I explained to the patient that Carafate is intended to treat ulcers and gastritis. At the present time she denies any symptoms of these. She is already on pantoprazole as well as Zantac which seemed to control her reflux. However she still complains of dysphasia. She also complains of food sticking in her upper esophagus at times. I recommended that she follow-up with her gastroenterologist as she may need an EGD and dilatation which she has had in the past.  Her blood pressure today is elevated. She is on losartan 50 mg by mouth daily she states that this works well but she has not been checking her blood pressure at home.  AT that time, my plan was: Patient is scheduling herself to meet with a neurosurgeon to discuss other options for pain control regarding her mid back pain. Fortunately this is improving. I recommended she discontinue calcitonin as she has been sporadic and using the  medication. Also recommended that she discontinue Carafate as her symptoms now do not suggest gastritis or an ulcer. However she is having dysphasia so I recommended that she call her gastroenterologist and arrange a follow-up to discuss possible repeat EGD. Her blood pressure today is elevated. I recommended that she check her blood pressure frequently over the next week and then provide the values to me to review. If consistently greater than 140/90, I would increase her losartan to 100 mg a day  09/20/16 Patient is here today to discuss her back pain. She's been doing very well on the fentanyl 75 g every 72 hours. She is usually only requiring one Percocet at night to help her sleep. However I will see I am very concerned about risk and an 82 year old female continuing fentanyl long-term. She is here today to discuss this further. She continues to have pain in her thoracic spine and her lumbar spine on a daily basis. She continues to have neuropathic pain in both legs. There is no evidence of cauda equina syndrome but rather only persistent pain.  At the present time the pain is not interfering with any activity of daily living.  Atthat time, my plan was: We discussed the risk of narcotic overdose given her advanced age on high-dose fentanyl. We will wean the patient down to 25 g every 72 hours to avoid withdrawal given the fact the patient is been on this medication from a 7 months. She can continue to use the Percocet for breakthrough pain as needed. In one month, if the patient is doing well we will discontinue fentanyl and replaced with Percocet 10/325 po q 6 hrs prn (120/month).  02/24/17 Patient has a history of chronic back pain is well outlined above. She reports pain in her neck mid back, and lower back. It is exacerbated by everyday movement. She also has peripheral neuropathy in her legs. She is successfully discontinued fentanyl and still takes Percocet but does so rarely only for breakthrough  pain. She is here today to discuss other options for treatment of her pain. She believes she may have fibromyalgia. I disagree. I believe there is a biomechanical mechanism of her chronic back pain beyond that explained by fibromyalgia. She is interested in seeing a chiropractor for "lasera" treatments. Essentially this uses a laser to "cause vasodilation to improve local blood flow and local oxygen levels to improve chronic pain". At that time, my plan was: My heart goes out for this patient. She is in chronic pain. Medicine has very little to offer her. She is tried a spinal cord stimulator with no benefit. She's tried epidural steroid injections with no benefit. She is a poor surgical candidate given her severe osteoporosis. She is currently taking regular narcotics to manage her back pain. I explained to the patient that I do not believe that the [place] therapy is FDA approved. Therefore I'm not an advocate for this. However the patient is very hopeful because she's had a friend who has tried it and is seeing significant benefit. I see no medical contraindications to her receiving this treatment and therefore I'm fine with her trying it as long as she realizes that it has not been rigorously studied by the FDA. She'll receive this treatment through a local chiropractor's office.   08/05/17 Patient is a difficult historian.  She is extremely sweet 83 year old white female here with multiple concerns.  I tried very hard to agenda set with her to focus her issues.  Her biggest concerns are (1) 2 weeks of rhinorrhea, postnasal drip, and cough productive of yellow sputum.(2) a trigger finger in her left third MCP joint that has been present for 2 weeks, (3) pain in her mid back on the left side that has been present for 2 weeks and is exacerbated by coughing.  After exhausting her concerns, I then proceeded to perform history and physical on each of these 3 issues.  At which point she also asked about pain in  her neck, her left arm, and peripheral neuropathy.  At that point I had to redirect the patient back to her initial 3 complaints because we did not have sufficient time to tackle all of her concerns.  Regarding problem #1, she has had an upper respiratory infection for 2 weeks.  Symptoms include rhinorrhea, postnasal drip, scratchy throat, chest congestion, and cough productive of yellow sputum.  She denies any chest pain or shortness of breath or fever.  She denies any sinus pain.  She is taking Mucinex and Robitussin and cetirizine.  Regarding problem #2, whenever she flexes her left third  MCP joint and makes a fist, she is unable to extend her PIP joint without forcibly extending it.  She has pain on the palmar surface of the MCP joint.  Regarding problem #3, ever since she had her upper respiratory infection and has been coughing, she has developed pain in the mid back on the left side just below the scapula that is made worse by coughing twisting turning and bending.  Past Medical History:  Diagnosis Date  . ADD (attention deficit disorder)   . Arthritis   . Asthma    occassional usage of inhalers  . Bowel obstruction ([place])   . Cancer [facility]    colon cancer, 16 yrs ago  . Cataract   . Depression   . GERD (gastroesophageal reflux disease)   . Hypertension   . Neuromuscular disorder ([facility])    neuropathy  . Osteoporosis   . Scoliosis   . Spinal stenosis    Past Surgical History:  Procedure Laterality Date  . ABDOMINAL HYSTERECTOMY  1972   partial  . COLONOSCOPY     2 yrs ago  . EYE SURGERY     cataracts bilat  . FRACTURE SURGERY     thoracic spine  . JOINT REPLACEMENT     bilat shoulders  . SIGMOID RESECTION / RECTOPEXY    . SPINAL CORD STIMULATOR INSERTION  11/18/2011   Procedure: LUMBAR SPINAL CORD STIMULATOR INSERTION;  Surgeon: Dahlia Bailiff, MD;  Location: [place];  Service: Orthopedics;  Laterality: N/A;  Thoracic 9 laminiotomy, SPINAL CORD STIMULATOR PLACEMENT thoracic 9-  Thoracic 7   Current Outpatient Prescriptions on File Prior to Visit  Medication Sig Dispense Refill  . alendronate (FOSAMAX) 70 MG tablet TAKE 1 TABLET EVERY 7 DAYS WITH A FULL GLASS OF WATER ON AN EMPTY STOMACH 12 tablet 4  . aspirin 81 MG chewable tablet Chew 81 mg by mouth every other day.    Marland Kitchen BREO ELLIPTA 100-25 MCG/INH AEPB USE 1 INHALATION DAILY 180 each 3  . Calcium Carbonate-Vitamin D (CALCIUM + D) 600-200 MG-UNIT TABS Take 1 tablet by mouth daily.    . cetirizine (ZYRTEC) 10 MG tablet TAKE 1 TABLET DAILY 90 tablet 3  . cyanocobalamin 2000 MCG tablet Take 2,000 mcg by mouth daily.    Marland Kitchen losartan (COZAAR) 50 MG tablet Take 1 tablet (50 mg total) by mouth daily. 90 tablet 2  . Melatonin 10 MG TABS Take 1 tablet by mouth at bedtime.    . Omega-3 Fatty Acids (FISH OIL) 1200 MG CPDR Take 4 capsules by mouth daily.    Marland Kitchen oxyCODONE-acetaminophen (PERCOCET) 10-325 MG tablet .5 - 1 tab po Q 6hours 90 tablet 0  . Plecanatide (TRULANCE) 3 MG TABS Take 3 mg by mouth daily.    Marland Kitchen Propylene Glycol (SYSTANE BALANCE OP) Apply 1 drop to eye as needed. For dry eyes    . ranitidine (ZANTAC) 150 MG tablet Take 1 tablet (150 mg total) by mouth 2 (two) times daily. 180 tablet 3  . UCERIS 9 MG TB24 Take 1 tablet by mouth daily.     No current facility-administered medications on file prior to visit.    Allergies  Allergen Reactions  . Cymbalta [Duloxetine Hcl] Other (See Comments)    Makes me lethargic  . Lyrica [Pregabalin] Other (See Comments)    Makes me lethargic  . Ranitidine Diarrhea  . Celebrex [Celecoxib] Rash  . Naprosyn [Naproxen] Rash   Social History   Social History  . Marital status: Married  Spouse name: N/A  . Number of children: 3  . Years of education: N/A   Occupational History  . Not on file.   Social History Main Topics  . Smoking status: Former Smoker    Quit date: 10/11/1977  . Smokeless tobacco: Never Used  . Alcohol use No  . Drug use: Yes    Types: Oxycodone    . Sexual activity: Not on file   Other Topics Concern  . Not on file   Social History Narrative   Lives at home with husband.     Review of Systems  All other systems reviewed and are negative.      Objective:   Physical Exam  Constitutional: She appears well-developed and well-nourished.  Cardiovascular: Normal rate, regular rhythm and normal heart sounds.   No murmur heard. Pulmonary/Chest: Effort normal and breath sounds normal. No respiratory distress. She has no wheezes. She has no rales.  Abdominal: Soft. Bowel sounds are normal.  Musculoskeletal:       Thoracic back: She exhibits decreased range of motion, tenderness, bony tenderness and pain.       Lumbar back: She exhibits decreased range of motion, tenderness, bony tenderness and pain.  Vitals reviewed.   Patient's nasal passages are clear except for mild rhinorrhea which is clear.  There is no sinus tenderness to palpation in the frontal or maxillary sinuses bilaterally.  Posterior oropharynx is clear without erythema.  There is no lymphadenopathy in the neck.  Lungs are clear to auscultation bilaterally without wheezes crackles or rails.  She does have a trigger finger at the left third MCP joint.  Examination of her back reveals some mild tenderness to palpation just below the scapula on the left side but no visible deformity.      Assessment & Plan:    URI I believe the patient has a viral upper respiratory infection.  Clinically there is no sign of a serious bacterial illness.  I recommended tincture of time.  Continue Mucinex and cetirizine and Robitussin as needed for nasal congestion and cough.  Symptoms should gradually improve over the next 5 days.  Should symptoms worsen, she is to call me immediately but I see no role or indication for antibiotics at this time  Trigger finger.  I recommended the patient wear a splint and avoid bending and flexing her MCP joint and PIP joint for the next week to see if the  trigger finger will improve with rest.  If not, the patient could return for a cortisone injection on the palmar surface of the left third MCP joint.  Mid back pain.  Patient has chronic back pain but I believe this is likely a muscle strain secondary to coughing.  I recommended tincture of time as I believe this will gradually improve as her upper respiratory infection improves.  Reassess in 1 week if no better or sooner if worse

## 2017-09-06 ENCOUNTER — Encounter: Payer: Self-pay | Admitting: Family Medicine

## 2017-09-06 DIAGNOSIS — Z803 Family history of malignant neoplasm of breast: Secondary | ICD-10-CM | POA: Diagnosis not present

## 2017-09-06 DIAGNOSIS — Z1231 Encounter for screening mammogram for malignant neoplasm of breast: Secondary | ICD-10-CM | POA: Diagnosis not present

## 2017-09-12 ENCOUNTER — Ambulatory Visit (INDEPENDENT_AMBULATORY_CARE_PROVIDER_SITE_OTHER): Payer: Medicare Other | Admitting: Family Medicine

## 2017-09-12 ENCOUNTER — Encounter: Payer: Self-pay | Admitting: Family Medicine

## 2017-09-12 VITALS — BP 122/64 | HR 74 | Temp 98.4°F | Resp 16 | Ht <= 58 in | Wt 141.0 lb

## 2017-09-12 DIAGNOSIS — M792 Neuralgia and neuritis, unspecified: Secondary | ICD-10-CM | POA: Diagnosis not present

## 2017-09-12 DIAGNOSIS — M48061 Spinal stenosis, lumbar region without neurogenic claudication: Secondary | ICD-10-CM | POA: Diagnosis not present

## 2017-09-12 DIAGNOSIS — M5136 Other intervertebral disc degeneration, lumbar region: Secondary | ICD-10-CM

## 2017-09-12 DIAGNOSIS — M65332 Trigger finger, left middle finger: Secondary | ICD-10-CM | POA: Diagnosis not present

## 2017-09-12 MED ORDER — OXYCODONE-ACETAMINOPHEN 10-325 MG PO TABS: ORAL_TABLET | ORAL | 0 refills | Status: DC

## 2017-09-12 MED ORDER — NORTRIPTYLINE HCL 25 MG PO CAPS: 25.0000 mg | ORAL_CAPSULE | Freq: Every day | ORAL | 3 refills | Status: DC

## 2017-09-12 NOTE — Progress Notes (Signed)
Subjective:    Patient ID: Brianna Jacobs, female    DOB: 1930/08/30, 83 y.o.   MRN: [id number]  HPI 01/05/16 Had MRI 2011 of lumbar spine:  T11-T12: Retropulsion produces mild central stenosis. Flattening the ventral aspect of the thoracic cord. T12-L1: Desiccated disc with broad-based posterior protrusion. Mild bilateral facet hypertrophy and ligamentum flavum redundancy without significant stenosis. L1-L2: Disc desiccation with broad-based posterior disc protrusion. Mild central stenosis. Mild bilateral facet hypertrophy. Foramina and lateral recesses patent. L2-L3: Severely degenerated and desiccated disc with degenerative endplate changes. Right greater than left facet hypertrophy. Central canal and lateral recesses patent. Moderate right foraminal stenosis associated with scoliosis and osteophytes. Right lateral disc protrusion and osteophyte could affect the exiting right  L2 nerve root. Lateral protrusion and osteophyte are new compared to prior  exam. L3-L4: Degenerated disc. Left eccentric broad-based posterior disc protrusion extending into the left neural foramen. Bilateral facet hypertrophy with moderate central stenosis. Left lateral recess stenosis. Moderate right foraminal stenosis with mild left foraminal stenosis. L4-L5: Severely degenerated and desiccated disc with endplate changes. Severe bilateral facet arthrosis and ligamentum flavum redundancy. Left foraminal disc protrusion and severe left foraminal stenosis with compression of the exiting left L4 nerve. Left greater than right bilateral subarticular lateral recess stenosis. Minimal right foraminal stenosis. Mild to moderate central stenosis. L5-S1: Grade 1 anterolisthesis with uncoverage of the disc and a broad-based protrusion. Bilateral lateral recess stenosis. Severe bilateral facet arthrosis with bilateral facet effusions. Mild central stenosis. Severe bilateral foraminal stenosis with compression of both L5 nerve roots.  Patient has a long-standing history of lower back problems as evidenced in the MRI report from 2011 listed above. She also has a history of 2 separate vertebral fractures in the thoracic spine show normal x-ray of the thoracic spine in 2013. However this weekend, the patient stumbled and fell in a restaurant. Shortly thereafter, she developed severe sharp stabbing pain up and down the thoracic spine. The pain is primarily located in the paraspinal areas on either side of thoracic spine. She denies any new numbness or weakness in the right leg. However there is some new numbness and tingling located on the lateral aspect of the left thigh. She has chronic weakness in both legs and chronic neuropathy in both legs. There is no tenderness to palpation over the spinous processes of the thoracic spine or in the thoracic spine paraspinal muscles however she states the pain is most intense it has been in years.  At that time, my plan  was: Differential diagnosis includes herniated disc in the thoracic spine after a fall versus vertebral fracture in the thoracic spine after a fall. I like the patient to go immediately for an x-ray of her thoracic spine. If there is a vertebral fracture, we can discuss possible kyphoplasty versus pain management. However if there is no evidence of a new vertebral fracture, I will treat the patient empirically as possibly having a herniated disc with a prednisone taper pack. I will await the results of the x-ray of the thoracic spine.  01/12/16 Patient is here today for follow-up. The pain in the center of her back is intensifying. It is 9 on a scale of 10. She is unable to walk without a walker. She has fallen 4 separate times due to the pain in her back and the worsening weakness in both legs. She also reports worsening numbness in both legs. The pain is primarily between the levels of T7 and T12. She is able to stand from a seated position with assistance. However she screams in pain when she does this. X-rays of the thoracic spine reveal degenerative disc disease, levoscoliosis, and stable old compression fractures with no acute changes. Prednisone taper pack was tried without relief.  At that time, my plan was: I'm concerned the patient may have spinal stenosis developing in the thoracic spine. She is unable to have an MRI of the thoracic spine due to the presence of a spinal cord stimulator. Therefore I'll schedule the patient for CT of the thoracic spine to characterize further. Meanwhile she can use fentanyl 25 g per hour every 72 hours and use oxycodone for breakthrough pain.  02/20/16 CT revealed: 1. New, from 2015, T7 compression fracture with minimal  height loss and no retropulsion. An acute fracture line is not seen but given the history and subtle perispinal edema favor subacute timing. 2. Remote T9, T10, T12, and L1 compression fractures as described.  Patient underwent T7 kyphoplasty and  vertebroplasty 4/25.  I also recommended fosamax.  Patient states the pain is worse. She now screams in pain whenever she moves to get out of a chair. She denies any paralysis in her legs. She does state that the numbness in her legs is getting worse. Weakness in her legs is not getting worse. She denies any bowel or bladder incontinence. She denies any saddle anesthesia. She denies any shortness of breath although she states it hurts to take a deep breath in. She also complains of pain and difficulty and dysphasia/trouble swallowing. She blames this on the addition of Fosamax.  At that time, my plan was: Very complicated situation. I explained to the patient I will focus on the pain first try to manage and control her pain and then see how many of her residual symptoms including dysphagia and trouble breathing persist. Increase fentanyl from 25 g per hour to 75 g per hour. Continue use oxycodone 10/325 one every 6 hours as needed. Add calcitonin nasal spray daily for her acute thoracic compression fracture. Recheck in one week. If her pain is better and she continues to have dysphasia discontinue Fosamax and begin GI workup. I would replace Fosamax with prolia. I see no evidence of an infection, cauda equina syndrome, or pulmonary embolism  03/18/16 Patient presents today complaining of severe pain in her back. She does think the fentanyl is helping. She is using Percocet 3-4 times a day. She uses a half a tablet to a whole tablet each time. She never started taking calcitonin due to concern regarding the side effects. She also reports worsening neuropathy in her right leg. Burning and stinging pain will wake her up at night radiating from her right thigh down to her right foot. The weakness in her legs is not getting worse. She denies any symptoms of cauda equina syndrome.  At that  Time, my plan was: Continue fentanyl 75 g every 72 hours. She will continue to use Percocet 10/325 1/2-1 tablet every 6 hours  as needed for pain. She has tried and failed gabapentin, Lyrica, Cymbalta, and amitriptyline for neuropathic pain. The only other option would know to try would be trokendi off label.  At the present time she is not interested in trying this. We will discontinue calcitonin as she is afraid of the side effects. The dysphagia is not getting worse and therefore we will discontinue the Carafate. Since the dysphagia is not getting worse, I will continue to have her on Fosamax given the multiple vertebral fractures she is having.  04/15/16 Patient is here today complaining of severe mid back pain all throughout her thoracic spine. Patient reports severe sharp pain with rotation of her trunk, with flexion, with extension of her spine. She has a difficult time getting up from a seated position. She has a difficult time sitting down. She has difficult time riding in a car. Any movement causes her to scream in pain. Fentanyl patches and Percocet every 6 hours is not providing her significant relief. She never started the calcitonin nasal spray.  AT that time, my plan was: Given her advanced age, I am hesitant to increase her narcotics higher due to potential side effects.  I will rule out other potential causes of back pain by obtaining  an SPEP and UPEP although I believe her pain is most likely due to the compression fractures in her thoracic spine coupled with her lumbar degenerative disc disease and mild to moderate spinal stenosis.  Therefore I will consult the pain clinic to better try to manage her pain as I have been unsuccessful.  05/25/16 Patient has yet to see the pain clinic. Fortunately however it seems like her pain is improving. She is still not using calcitonin. She is here today because she would like to go over all the medication she is taking. I reviewed with her the med list in detail. I explained to her the calcitonin is simply for bone pain. She can certainly stop that and discard the medication if  she does not feel it's beneficial. I explained to the patient that Carafate is intended to treat ulcers and gastritis. At the present time she denies any symptoms of these. She is already on pantoprazole as well as Zantac which seemed to control her reflux. However she still complains of dysphasia. She also complains of food sticking in her upper esophagus at times. I recommended that she follow-up with her gastroenterologist as she may need an EGD and dilatation which she has had in the past.  Her blood pressure today is elevated. She is on losartan 50 mg by mouth daily she states that this works well but she has not been checking her blood pressure at home.  AT that time, my plan was: Patient is scheduling herself to meet with a neurosurgeon to discuss other options for pain control regarding her mid back pain. Fortunately this is improving. I recommended she discontinue calcitonin as she has been sporadic and using the medication. Also recommended that she discontinue Carafate as her symptoms now do not suggest gastritis or an ulcer. However she is having dysphasia so I recommended that she call her gastroenterologist and arrange a follow-up to discuss possible repeat EGD. Her blood pressure today is elevated. I recommended that she check her blood pressure frequently over the next week and then provide the values to me to review. If consistently greater than 140/90, I would increase her losartan to 100 mg a day  09/20/16 Patient is here today to discuss her back pain. She's been doing very well on the fentanyl 75 g every 72 hours. She is usually only requiring one Percocet at night to help her sleep. However I will see I am very concerned about risk and an 83 year old female continuing fentanyl long-term. She is here today to discuss this further. She continues to have pain in her thoracic spine and her lumbar spine on a daily basis. She continues to have neuropathic pain in both legs. There is no evidence  of cauda equina syndrome but rather only persistent pain. At the present time the pain is not interfering with any activity of daily living.  Atthat time, my plan was: We discussed the risk of narcotic overdose given her advanced age on high-dose fentanyl. We will wean the patient down to 25 g every 72 hours to avoid withdrawal given the fact the patient is been on this medication from a 7 months. She can continue to use the Percocet for breakthrough pain as needed. In one month, if the patient is doing well we will discontinue fentanyl and replaced with Percocet 10/325 po q 6 hrs prn (120/month).  02/24/17 Patient has a history of chronic back pain is well outlined above. She reports pain in her neck mid back, and lower  back. It is exacerbated by everyday movement. She also has peripheral neuropathy in her legs. She is successfully discontinued fentanyl and still takes Percocet but does so rarely only for breakthrough pain. She is here today to discuss other options for treatment of her pain. She believes she may have fibromyalgia. I disagree. I believe there is a biomechanical mechanism of her chronic back pain beyond that explained by fibromyalgia. She is interested in seeing a chiropractor for "lasera" treatments. Essentially this uses a laser to "cause vasodilation to improve local blood flow and local oxygen levels to improve chronic pain". At that time, my plan was: My heart goes out for this patient. She is in chronic pain. Medicine has very little to offer her. She is tried a spinal cord stimulator with no benefit. She's tried epidural steroid injections with no benefit. She is a poor surgical candidate given her severe osteoporosis. She is currently taking regular narcotics to manage her back pain. I explained to the patient that I do not believe that the [place] therapy is FDA approved. Therefore I'm not an advocate for this. However the patient is very hopeful because she's had a friend who has tried  it and is seeing significant benefit. I see no medical contraindications to her receiving this treatment and therefore I'm fine with her trying it as long as she realizes that it has not been rigorously studied by the FDA. She'll receive this treatment through a local chiropractor's office.   08/05/17 Patient is a difficult historian.  She is extremely sweet 83 year old white female here with multiple concerns.  I tried very hard to agenda set with her to focus her issues.  Her biggest concerns are (1) 2 weeks of rhinorrhea, postnasal drip, and cough productive of yellow sputum.(2) a trigger finger in her left third MCP joint that has been present for 2 weeks, (3) pain in her mid back on the left side that has been present for 2 weeks and is exacerbated by coughing.  After exhausting her concerns, I then proceeded to perform history and physical on each of these 3 issues.  At which point she also asked about pain in her neck, her left arm, and peripheral neuropathy.  At that point I had to redirect the patient back to her initial 3 complaints because we did not have sufficient time to tackle all of her concerns.  Regarding problem #1, she has had an upper respiratory infection for 2 weeks.  Symptoms include rhinorrhea, postnasal drip, scratchy throat, chest congestion, and cough productive of yellow sputum.  She denies any chest pain or shortness of breath or fever.  She denies any sinus pain.  She is taking Mucinex and Robitussin and cetirizine.  Regarding problem #2, whenever she flexes her left third MCP joint and makes a fist, she is unable to extend her PIP joint without forcibly extending it.  She has pain on the palmar surface of the MCP joint.  Regarding problem #3, ever since she had her upper respiratory infection and has been coughing, she has developed pain in the mid back on the left side just below the scapula that is made worse by coughing twisting turning and bending.  09/12/17 Patient is here  today asking if I have any other options to treat her chronic pain.  She reports burning stinging aching pain in both legs from the soles of her feet up to her mid thighs.  It is equal and symmetric bilaterally.  It is constant and severe.  It keeps her from sleeping at night.  She tries to limit the oxycodone is much as possible.  Occasionally she only takes the medication once or twice a day and a half a tablet at a time.  However this is providing her little if any pain relief.  Gabapentin provided her no relief.  She was unable to tolerate Lyrica and/or Cymbalta.  He has never tried nortriptyline.  She has never tried Topamax.  She also continues to complain of pain and a trigger finger in her left hand at the third MCP joint.  However there also appears to be a contracture developing in the flexor tendon proximal to the third MCP joint.  Therefore I am uncertain whether this is simply just a trigger finger or also mild Dupuytren's contracture. Past Medical History:  Diagnosis Date  . ADD (attention deficit disorder)   . Arthritis   . Asthma    occassional usage of inhalers  . Bowel obstruction ([place])   . Cancer [facility])    colon cancer, 16 yrs ago  . Cataract   . Depression   . GERD (gastroesophageal reflux disease)   . Hypertension   . Neuromuscular disorder ([facility])    neuropathy  . Osteoporosis   . Scoliosis   . Spinal stenosis    Past Surgical History:  Procedure Laterality Date  . ABDOMINAL HYSTERECTOMY  1972   partial  . COLONOSCOPY     2 yrs ago  . EYE SURGERY     cataracts bilat  . FRACTURE SURGERY     thoracic spine  . JOINT REPLACEMENT     bilat shoulders  . SIGMOID RESECTION / RECTOPEXY    . SPINAL CORD STIMULATOR INSERTION  11/18/2011   Procedure: LUMBAR SPINAL CORD STIMULATOR INSERTION;  Surgeon: Dahlia Bailiff, MD;  Location: [place];  Service: Orthopedics;  Laterality: N/A;  Thoracic 9 laminiotomy, SPINAL CORD STIMULATOR PLACEMENT thoracic 9- Thoracic 7   Current  Outpatient Medications on File Prior to Visit  Medication Sig Dispense Refill  . alendronate (FOSAMAX) 70 MG tablet TAKE 1 TABLET EVERY 7 DAYS WITH A FULL GLASS OF WATER ON AN EMPTY STOMACH 12 tablet 4  . aspirin 81 MG chewable tablet Chew 81 mg by mouth every other day.    Marland Kitchen BREO ELLIPTA 100-25 MCG/INH AEPB USE 1 INHALATION DAILY 180 each 3  . Calcium Carbonate-Vitamin D (CALCIUM + D) 600-200 MG-UNIT TABS Take 1 tablet by mouth daily.    . cetirizine (ZYRTEC) 10 MG tablet TAKE 1 TABLET DAILY 90 tablet 3  . cyanocobalamin 2000 MCG tablet Take 2,000 mcg by mouth daily.    Marland Kitchen losartan (COZAAR) 50 MG tablet Take 1 tablet (50 mg total) by mouth daily. 90 tablet 2  . Melatonin 10 MG TABS Take 1 tablet by mouth at bedtime.    . Omega-3 Fatty Acids (FISH OIL) 1200 MG CPDR Take 4 capsules by mouth daily.    Marland Kitchen Plecanatide (TRULANCE) 3 MG TABS Take 3 mg by mouth daily.    Marland Kitchen Propylene Glycol (SYSTANE BALANCE OP) Apply 1 drop to eye as needed. For dry eyes    . ranitidine (ZANTAC) 150 MG tablet Take 1 tablet (150 mg total) by mouth 2 (two) times daily. 180 tablet 3   No current facility-administered medications on file prior to visit.    Allergies  Allergen Reactions  . Cymbalta [Duloxetine Hcl] Other (See Comments)    Makes me lethargic  . Lyrica [Pregabalin] Other (See Comments)  Makes me lethargic  . Ranitidine Diarrhea  . Celebrex [Celecoxib] Rash  . Naprosyn [Naproxen] Rash   Social History   Socioeconomic History  . Marital status: Married    Spouse name: Not on file  . Number of children: 3  . Years of education: Not on file  . Highest education level: Not on file  Social Needs  . Financial resource strain: Not on file  . Food insecurity - worry: Not on file  . Food insecurity - inability: Not on file  . Transportation needs - medical: Not on file  . Transportation needs - non-medical: Not on file  Occupational History  . Not on file  Tobacco Use  . Smoking status: Former  Smoker    Last attempt to quit: 10/11/1977    Years since quitting: 39.9  . Smokeless tobacco: Never Used  Substance and Sexual Activity  . Alcohol use: No  . Drug use: Yes    Types: Oxycodone  . Sexual activity: Not on file  Other Topics Concern  . Not on file  Social History Narrative   Lives at home with husband.     Review of Systems  All other systems reviewed and are negative.      Objective:   Physical Exam  Constitutional: She appears well-developed and well-nourished.  Cardiovascular: Normal rate, regular rhythm and normal heart sounds.   No murmur heard. Pulmonary/Chest: Effort normal and breath sounds normal. No respiratory distress. She has no wheezes. She has no rales.  Abdominal: Soft. Bowel sounds are normal.  Musculoskeletal:       Thoracic back: She exhibits decreased range of motion, tenderness, bony tenderness and pain.       Lumbar back: She exhibits decreased range of motion, tenderness, bony tenderness and pain.  Vitals reviewed.        Assessment & Plan:    Trigger middle finger of left hand - Plan: Ambulatory referral to Hand Surgery  DDD (degenerative disc disease), lumbar  Spinal stenosis of lumbar region without neurogenic claudication  Neuropathic pain  I spent more than 30 minutes today with the patient discussing her previous history and the medications we have tried and failed in the past.  I recommended trying nortriptyline 25 mg p.o. nightly and then reassessing in 3-4 weeks if she sees improvement.  Could consider the combination of nortriptyline with gabapentin if benefit is seen.  Meanwhile I will consult a hand surgeon regarding the trigger finger of her third MCP joint as I believe this may also be complicated by mild early Dupuytren's contracture.  Reassess in 3-4 weeks.  I did refill her oxycodone  today

## 2017-09-13 ENCOUNTER — Ambulatory Visit: Payer: Self-pay | Admitting: Family Medicine

## 2017-09-22 DIAGNOSIS — M65332 Trigger finger, left middle finger: Secondary | ICD-10-CM | POA: Diagnosis not present

## 2017-10-17 ENCOUNTER — Telehealth: Payer: Self-pay | Admitting: Family Medicine

## 2017-10-17 MED ORDER — NORTRIPTYLINE HCL 25 MG PO CAPS: 25.0000 mg | ORAL_CAPSULE | Freq: Every day | ORAL | 3 refills | Status: DC

## 2017-10-17 NOTE — Telephone Encounter (Signed)
Pt called and states the Nortriptyline is working great and would like a RX sent to mail order. Per Dr. Dennard Schaumann ok to send. Med sent.

## 2017-10-25 DIAGNOSIS — K5903 Drug induced constipation: Secondary | ICD-10-CM | POA: Diagnosis not present

## 2017-10-25 DIAGNOSIS — K5641 Fecal impaction: Secondary | ICD-10-CM | POA: Diagnosis not present

## 2017-10-25 DIAGNOSIS — Z85030 Personal history of malignant carcinoid tumor of large intestine: Secondary | ICD-10-CM | POA: Diagnosis not present

## 2017-10-25 DIAGNOSIS — R151 Fecal smearing: Secondary | ICD-10-CM | POA: Diagnosis not present

## 2017-11-02 DIAGNOSIS — M65332 Trigger finger, left middle finger: Secondary | ICD-10-CM | POA: Diagnosis not present

## 2017-11-08 IMAGING — CR DG THORACIC SPINE 3V
4 series · 4 of 4 positions shown · non-contrast
Comparison: Chest radiographs 08/23/2014; chest CT 09/27/2014

CLINICAL DATA: Mid thoracic pain for a few weeks, no known injury,
spinal cord stimulator placed 5339, spinal stenosis

EXAM:
THORACIC SPINE - 3 VIEWS

[t t-spine a.p. (1 of 2)]
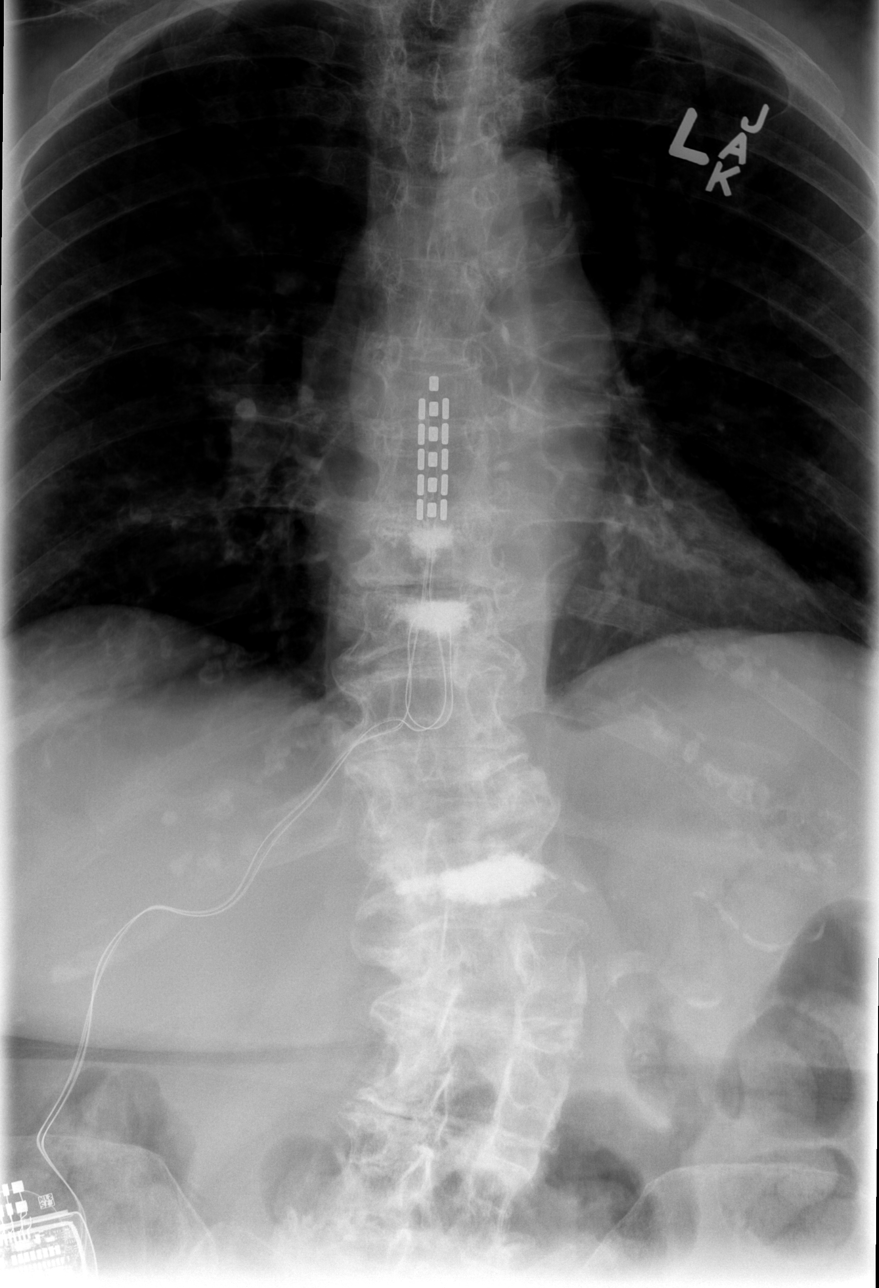

[t t-spine a.p. (2 of 2)]
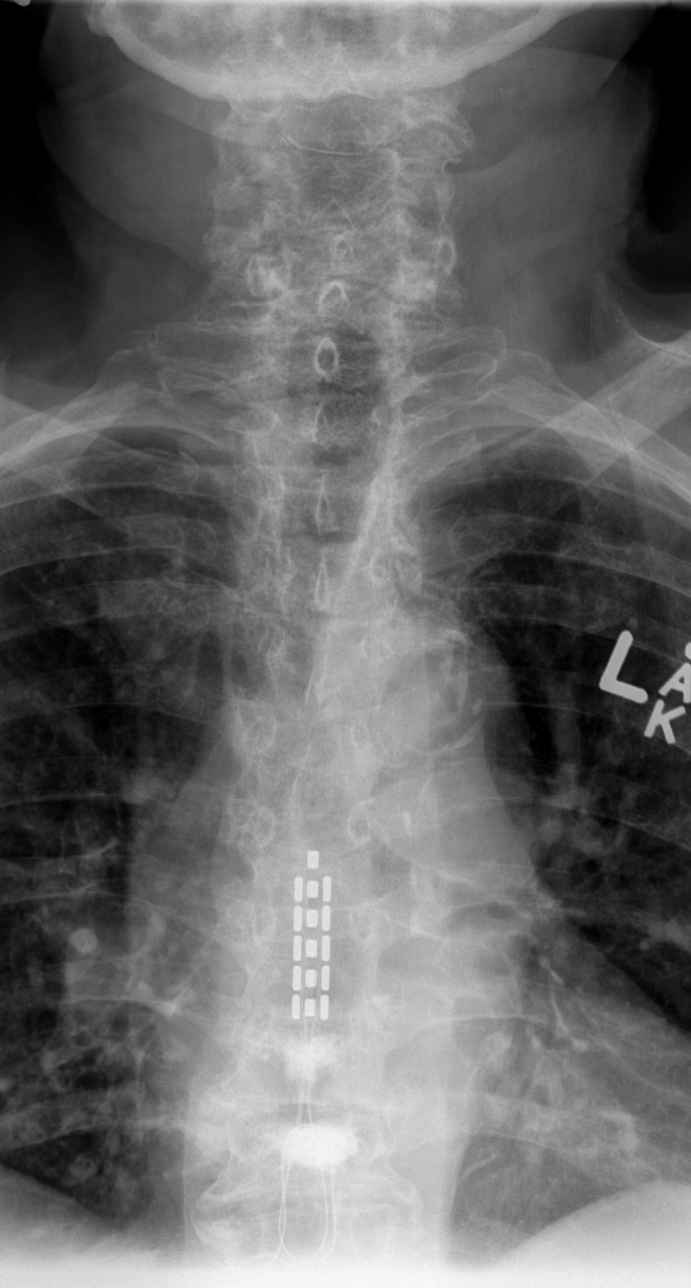

[t t-spine lat *]
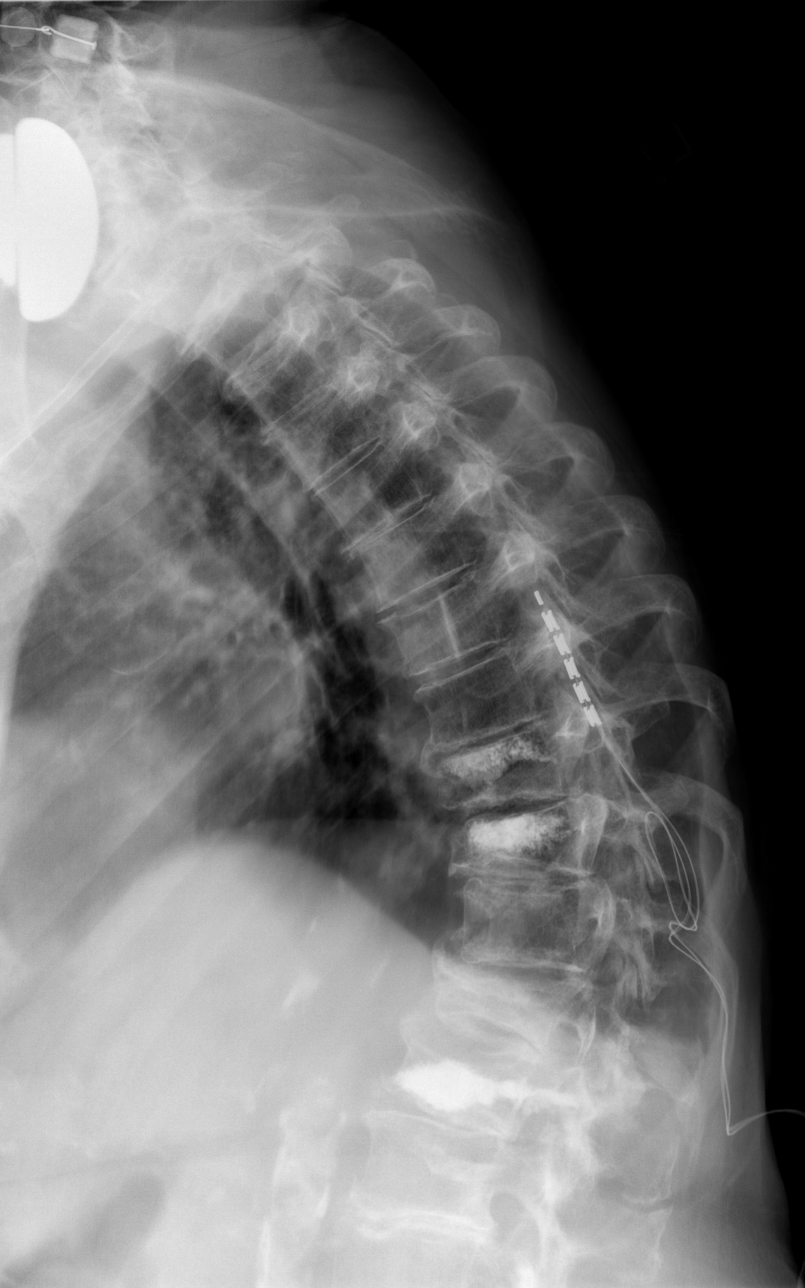

[t swimmers]
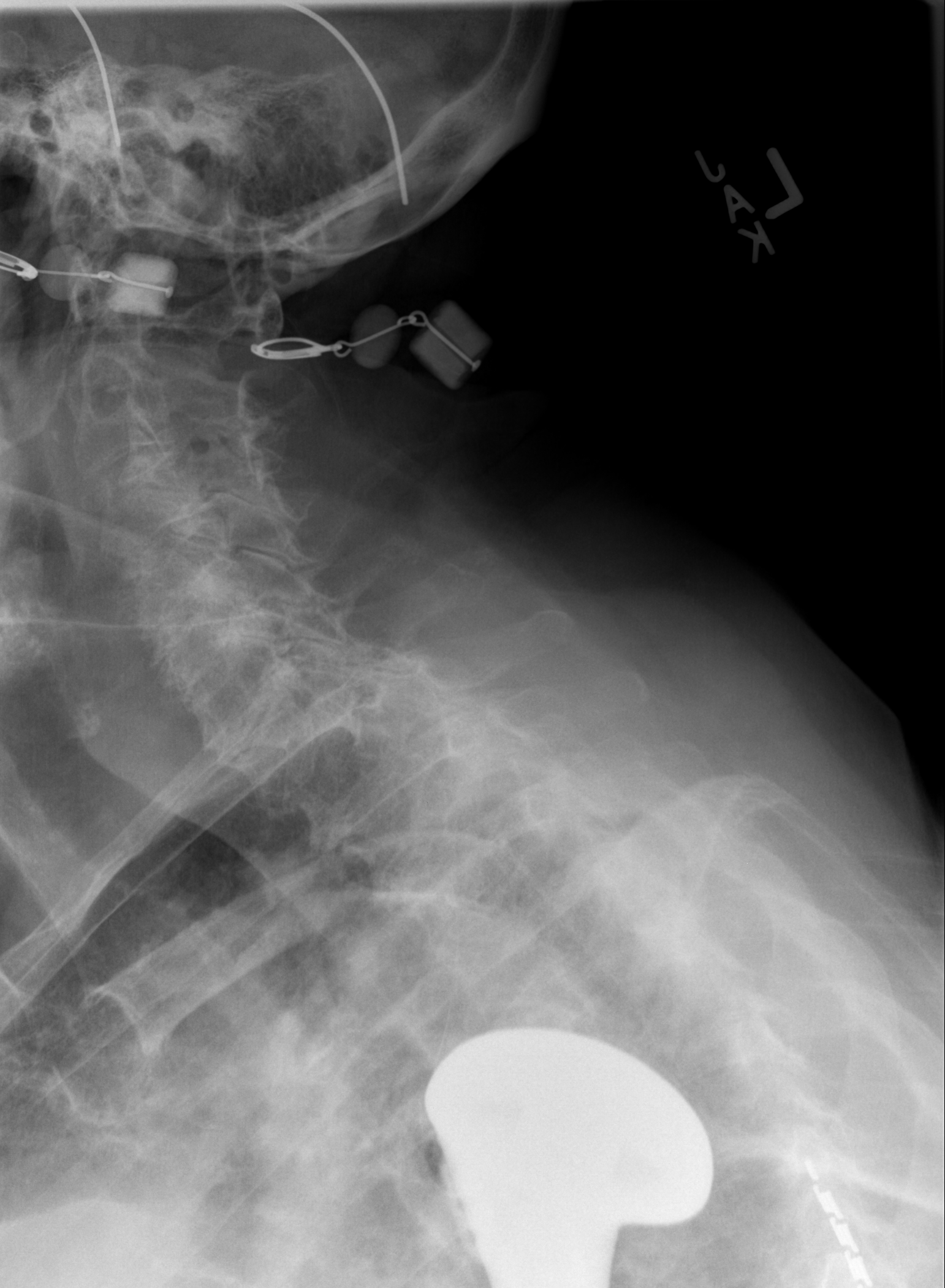

[4 of 4 positions shown; findings below may reference images not displayed]

FINDINGS: Diffuse osseous demineralization.

12 pairs of ribs.

Multilevel disc space narrowing and endplate spur formation.

Spinal stimulator lead projects over T7-T8.

Prior super endplate compression fractures and spinal augmentation
procedures at T9, T10 and L1, stable.

Additional marked superior endplate compression fracture of T12,
unchanged.

No fracture or bone destruction.

Levoconvex thoracolumbar scoliosis.

Multilevel degenerative disc and facet disease changes of the
cervical spine.

Degenerative disc disease changes of the upper thoracic spine, mild
in degree.

Atherosclerotic calcification aorta.
IMPRESSION: Prior compression fractures and subsequent spinal augmentation
procedures at T9, T10, and L1.

Stable T12 superior endplate compression fracture.

Osseous demineralization with scattered degenerative changes and
scoliosis as above.

No definite acute osseous abnormalities.

## 2017-11-15 DIAGNOSIS — H5712 Ocular pain, left eye: Secondary | ICD-10-CM | POA: Diagnosis not present

## 2017-11-15 DIAGNOSIS — H02055 Trichiasis without entropian left lower eyelid: Secondary | ICD-10-CM | POA: Diagnosis not present

## 2017-11-30 ENCOUNTER — Encounter: Payer: Self-pay | Admitting: Family Medicine

## 2017-12-02 ENCOUNTER — Ambulatory Visit (INDEPENDENT_AMBULATORY_CARE_PROVIDER_SITE_OTHER): Payer: Medicare Other | Admitting: Family Medicine

## 2017-12-02 ENCOUNTER — Encounter: Payer: Self-pay | Admitting: Family Medicine

## 2017-12-02 VITALS — BP 128/68 | HR 110 | Temp 98.7°F | Resp 14 | Ht <= 58 in | Wt 141.0 lb

## 2017-12-02 DIAGNOSIS — M48061 Spinal stenosis, lumbar region without neurogenic claudication: Secondary | ICD-10-CM

## 2017-12-02 DIAGNOSIS — M792 Neuralgia and neuritis, unspecified: Secondary | ICD-10-CM | POA: Diagnosis not present

## 2017-12-02 DIAGNOSIS — M5136 Other intervertebral disc degeneration, lumbar region: Secondary | ICD-10-CM

## 2017-12-02 MED ORDER — OXYCODONE-ACETAMINOPHEN 10-325 MG PO TABS: ORAL_TABLET | ORAL | 0 refills | Status: DC

## 2017-12-02 NOTE — Progress Notes (Signed)
Subjective:    Patient ID: Brianna Jacobs, female    DOB: 1930/05/20, 84 y.o.   MRN: [id number]  HPI 01/05/16 Had MRI 2011 of lumbar spine:  T11-T12: Retropulsion produces mild central stenosis. Flattening the ventral aspect of the thoracic cord. T12-L1: Desiccated disc with broad-based posterior protrusion. Mild bilateral facet hypertrophy and ligamentum flavum redundancy without significant stenosis. L1-L2: Disc desiccation with broad-based posterior disc protrusion. Mild central stenosis. Mild bilateral facet hypertrophy. Foramina and lateral recesses patent. L2-L3: Severely degenerated and desiccated disc with degenerative endplate changes. Right greater than left facet hypertrophy. Central canal and lateral recesses patent. Moderate right foraminal stenosis associated with scoliosis and osteophytes. Right lateral disc protrusion and osteophyte could affect the exiting right  L2 nerve root. Lateral protrusion and osteophyte are new compared to prior  exam. L3-L4: Degenerated disc. Left eccentric broad-based posterior disc protrusion extending into the left neural foramen. Bilateral facet hypertrophy with moderate central stenosis. Left lateral recess stenosis. Moderate right foraminal stenosis with mild left foraminal stenosis. L4-L5: Severely degenerated and desiccated disc with endplate changes. Severe bilateral facet arthrosis and ligamentum flavum redundancy. Left foraminal disc protrusion and severe left foraminal stenosis with compression of the exiting left L4 nerve. Left greater than right bilateral subarticular lateral recess stenosis. Minimal right foraminal stenosis. Mild to moderate central stenosis. L5-S1: Grade 1 anterolisthesis with uncoverage of the disc and a broad-based protrusion. Bilateral lateral recess stenosis. Severe bilateral facet arthrosis with bilateral facet effusions. Mild central stenosis. Severe bilateral foraminal stenosis with compression of both L5 nerve roots.  Patient has a long-standing history of lower back problems as evidenced in the MRI report from 2011 listed above. She also has a history of 2 separate vertebral fractures in the thoracic spine show normal x-ray of the thoracic spine in 2013. However this weekend, the patient stumbled and fell in a restaurant. Shortly thereafter, she developed severe sharp stabbing pain up and down the thoracic spine. The pain is primarily located in the paraspinal areas on either side of thoracic spine. She denies any new numbness or weakness in the right leg. However there is some new numbness and tingling located on the lateral aspect of the left thigh. She has chronic weakness in both legs and chronic neuropathy in both legs. There is no tenderness to palpation over the spinous processes of the thoracic spine or in the thoracic spine paraspinal muscles however she states the pain is most intense it has been in years.  At that time, my plan  was: Differential diagnosis includes herniated disc in the thoracic spine after a fall versus vertebral fracture in the thoracic spine after a fall. I like the patient to go immediately for an x-ray of her thoracic spine. If there is a vertebral fracture, we can discuss possible kyphoplasty versus pain management. However if there is no evidence of a new vertebral fracture, I will treat the patient empirically as possibly having a herniated disc with a prednisone taper pack. I will await the results of the x-ray of the thoracic spine.  01/12/16 Patient is here today for follow-up. The pain in the center of her back is intensifying. It is 9 on a scale of 10. She is unable to walk without a walker. She has fallen 4 separate times due to the pain in her back and the worsening weakness in both legs. She also reports worsening numbness in both legs. The pain is primarily between the levels of T7 and T12. She is able to stand from a seated position with assistance. However she screams in pain when she does this. X-rays of the thoracic spine reveal degenerative disc disease, levoscoliosis, and stable old compression fractures with no acute changes. Prednisone taper pack was tried without relief.  At that time, my plan was: I'm concerned the patient may have spinal stenosis developing in the thoracic spine. She is unable to have an MRI of the thoracic spine due to the presence of a spinal cord stimulator. Therefore I'll schedule the patient for CT of the thoracic spine to characterize further. Meanwhile she can use fentanyl 25 g per hour every 72 hours and use oxycodone for breakthrough pain.  02/20/16 CT revealed: 1. New, from 2015, T7 compression fracture with minimal  height loss and no retropulsion. An acute fracture line is not seen but given the history and subtle perispinal edema favor subacute timing. 2. Remote T9, T10, T12, and L1 compression fractures as described.  Patient underwent T7 kyphoplasty and  vertebroplasty 4/25.  I also recommended fosamax.  Patient states the pain is worse. She now screams in pain whenever she moves to get out of a chair. She denies any paralysis in her legs. She does state that the numbness in her legs is getting worse. Weakness in her legs is not getting worse. She denies any bowel or bladder incontinence. She denies any saddle anesthesia. She denies any shortness of breath although she states it hurts to take a deep breath in. She also complains of pain and difficulty and dysphasia/trouble swallowing. She blames this on the addition of Fosamax.  At that time, my plan was: Very complicated situation. I explained to the patient I will focus on the pain first try to manage and control her pain and then see how many of her residual symptoms including dysphagia and trouble breathing persist. Increase fentanyl from 25 g per hour to 75 g per hour. Continue use oxycodone 10/325 one every 6 hours as needed. Add calcitonin nasal spray daily for her acute thoracic compression fracture. Recheck in one week. If her pain is better and she continues to have dysphasia discontinue Fosamax and begin GI workup. I would replace Fosamax with prolia. I see no evidence of an infection, cauda equina syndrome, or pulmonary embolism  03/18/16 Patient presents today complaining of severe pain in her back. She does think the fentanyl is helping. She is using Percocet 3-4 times a day. She uses a half a tablet to a whole tablet each time. She never started taking calcitonin due to concern regarding the side effects. She also reports worsening neuropathy in her right leg. Burning and stinging pain will wake her up at night radiating from her right thigh down to her right foot. The weakness in her legs is not getting worse. She denies any symptoms of cauda equina syndrome.  At that  Time, my plan was: Continue fentanyl 75 g every 72 hours. She will continue to use Percocet 10/325 1/2-1 tablet every 6 hours  as needed for pain. She has tried and failed gabapentin, Lyrica, Cymbalta, and amitriptyline for neuropathic pain. The only other option would know to try would be trokendi off label.  At the present time she is not interested in trying this. We will discontinue calcitonin as she is afraid of the side effects. The dysphagia is not getting worse and therefore we will discontinue the Carafate. Since the dysphagia is not getting worse, I will continue to have her on Fosamax given the multiple vertebral fractures she is having.  04/15/16 Patient is here today complaining of severe mid back pain all throughout her thoracic spine. Patient reports severe sharp pain with rotation of her trunk, with flexion, with extension of her spine. She has a difficult time getting up from a seated position. She has a difficult time sitting down. She has difficult time riding in a car. Any movement causes her to scream in pain. Fentanyl patches and Percocet every 6 hours is not providing her significant relief. She never started the calcitonin nasal spray.  AT that time, my plan was: Given her advanced age, I am hesitant to increase her narcotics higher due to potential side effects.  I will rule out other potential causes of back pain by obtaining  an SPEP and UPEP although I believe her pain is most likely due to the compression fractures in her thoracic spine coupled with her lumbar degenerative disc disease and mild to moderate spinal stenosis.  Therefore I will consult the pain clinic to better try to manage her pain as I have been unsuccessful.  05/25/16 Patient has yet to see the pain clinic. Fortunately however it seems like her pain is improving. She is still not using calcitonin. She is here today because she would like to go over all the medication she is taking. I reviewed with her the med list in detail. I explained to her the calcitonin is simply for bone pain. She can certainly stop that and discard the medication if  she does not feel it's beneficial. I explained to the patient that Carafate is intended to treat ulcers and gastritis. At the present time she denies any symptoms of these. She is already on pantoprazole as well as Zantac which seemed to control her reflux. However she still complains of dysphasia. She also complains of food sticking in her upper esophagus at times. I recommended that she follow-up with her gastroenterologist as she may need an EGD and dilatation which she has had in the past.  Her blood pressure today is elevated. She is on losartan 50 mg by mouth daily she states that this works well but she has not been checking her blood pressure at home.  AT that time, my plan was: Patient is scheduling herself to meet with a neurosurgeon to discuss other options for pain control regarding her mid back pain. Fortunately this is improving. I recommended she discontinue calcitonin as she has been sporadic and using the medication. Also recommended that she discontinue Carafate as her symptoms now do not suggest gastritis or an ulcer. However she is having dysphasia so I recommended that she call her gastroenterologist and arrange a follow-up to discuss possible repeat EGD. Her blood pressure today is elevated. I recommended that she check her blood pressure frequently over the next week and then provide the values to me to review. If consistently greater than 140/90, I would increase her losartan to 100 mg a day  09/20/16 Patient is here today to discuss her back pain. She's been doing very well on the fentanyl 75 g every 72 hours. She is usually only requiring one Percocet at night to help her sleep. However I will see I am very concerned about risk and an 84 year old female continuing fentanyl long-term. She is here today to discuss this further. She continues to have pain in her thoracic spine and her lumbar spine on a daily basis. She continues to have neuropathic pain in both legs. There is no evidence  of cauda equina syndrome but rather only persistent pain. At the present time the pain is not interfering with any activity of daily living.  Atthat time, my plan was: We discussed the risk of narcotic overdose given her advanced age on high-dose fentanyl. We will wean the patient down to 25 g every 72 hours to avoid withdrawal given the fact the patient is been on this medication from a 7 months. She can continue to use the Percocet for breakthrough pain as needed. In one month, if the patient is doing well we will discontinue fentanyl and replaced with Percocet 10/325 po q 6 hrs prn (120/month).  02/24/17 Patient has a history of chronic back pain is well outlined above. She reports pain in her neck mid back, and lower  back. It is exacerbated by everyday movement. She also has peripheral neuropathy in her legs. She is successfully discontinued fentanyl and still takes Percocet but does so rarely only for breakthrough pain. She is here today to discuss other options for treatment of her pain. She believes she may have fibromyalgia. I disagree. I believe there is a biomechanical mechanism of her chronic back pain beyond that explained by fibromyalgia. She is interested in seeing a chiropractor for "lasera" treatments. Essentially this uses a laser to "cause vasodilation to improve local blood flow and local oxygen levels to improve chronic pain". At that time, my plan was: My heart goes out for this patient. She is in chronic pain. Medicine has very little to offer her. She is tried a spinal cord stimulator with no benefit. She's tried epidural steroid injections with no benefit. She is a poor surgical candidate given her severe osteoporosis. She is currently taking regular narcotics to manage her back pain. I explained to the patient that I do not believe that the [place] therapy is FDA approved. Therefore I'm not an advocate for this. However the patient is very hopeful because she's had a friend who has tried  it and is seeing significant benefit. I see no medical contraindications to her receiving this treatment and therefore I'm fine with her trying it as long as she realizes that it has not been rigorously studied by the FDA. She'll receive this treatment through a local chiropractor's office.   08/05/17 Patient is a difficult historian.  She is extremely sweet 84 year old white female here with multiple concerns.  I tried very hard to agenda set with her to focus her issues.  Her biggest concerns are (1) 2 weeks of rhinorrhea, postnasal drip, and cough productive of yellow sputum.(2) a trigger finger in her left third MCP joint that has been present for 2 weeks, (3) pain in her mid back on the left side that has been present for 2 weeks and is exacerbated by coughing.  After exhausting her concerns, I then proceeded to perform history and physical on each of these 3 issues.  At which point she also asked about pain in her neck, her left arm, and peripheral neuropathy.  At that point I had to redirect the patient back to her initial 3 complaints because we did not have sufficient time to tackle all of her concerns.  Regarding problem #1, she has had an upper respiratory infection for 2 weeks.  Symptoms include rhinorrhea, postnasal drip, scratchy throat, chest congestion, and cough productive of yellow sputum.  She denies any chest pain or shortness of breath or fever.  She denies any sinus pain.  She is taking Mucinex and Robitussin and cetirizine.  Regarding problem #2, whenever she flexes her left third MCP joint and makes a fist, she is unable to extend her PIP joint without forcibly extending it.  She has pain on the palmar surface of the MCP joint.  Regarding problem #3, ever since she had her upper respiratory infection and has been coughing, she has developed pain in the mid back on the left side just below the scapula that is made worse by coughing twisting turning and bending.  09/12/17 Patient is here  today asking if I have any other options to treat her chronic pain.  She reports burning stinging aching pain in both legs from the soles of her feet up to her mid thighs.  It is equal and symmetric bilaterally.  It is constant and severe.  It keeps her from sleeping at night.  She tries to limit the oxycodone is much as possible.  Occasionally she only takes the medication once or twice a day and a half a tablet at a time.  However this is providing her little if any pain relief.  Gabapentin provided her no relief.  She was unable to tolerate Lyrica and/or Cymbalta.  He has never tried nortriptyline.  She has never tried Topamax.  She also continues to complain of pain and a trigger finger in her left hand at the third MCP joint.  However there also appears to be a contracture developing in the flexor tendon proximal to the third MCP joint.  Therefore I am uncertain whether this is simply just a trigger finger or also mild Dupuytren's contracture.  At that time, my plan was: I spent more than 30 minutes today with the patient discussing her previous history and the medications we have tried and failed in the past.  I recommended trying nortriptyline 25 mg p.o. nightly and then reassessing in 3-4 weeks if she sees improvement.  Could consider the combination of nortriptyline with gabapentin if benefit is seen.  Meanwhile I will consult a hand surgeon regarding the trigger finger of her third MCP joint as I believe this may also be complicated by mild early Dupuytren's contracture.  Reassess in 3-4 weeks.  I did refill her oxycodone today   12/02/17 Patient is here today reporting neuropathic pain in both legs up to her mid thighs.  Please see the discussion above.  This is a chronic problem for this patient.  When I questioned her about nortriptyline, she states that the medication really helps her sleep however it is doing very little to help her pain during the morning and afternoon.  However she denies any  constipation or dry mouth or dizziness.  She is still using oxycodone.  She takes 1/2-1 tablet every night and may be a half a tablet during the daytime as needed for neuropathic pain.  We have discussed multiple times weaning off the narcotic pain medication however she has been unable to do so due to the pain in her back and in her legs.  She also complains of a corn on the lateral dorsal aspect of the fifth IP joint of the right foot.  There is a 4 mm corn there.  Also in between the fourth and fifth toes, there is a thick hard keratinaceous papule that is painful that appears to be a callus versus corn. Past Medical History:  Diagnosis Date  . ADD (attention deficit disorder)   . Arthritis   . Asthma    occassional usage of inhalers  . Bowel obstruction ([place])   . Cancer [facility])    colon cancer, 16 yrs ago  . Cataract   . Depression   . GERD (gastroesophageal reflux disease)   . Hypertension   . Neuromuscular disorder ([facility])    neuropathy  . Osteoporosis   . Scoliosis   . Spinal stenosis    Past Surgical History:  Procedure Laterality Date  . ABDOMINAL HYSTERECTOMY  1972   partial  . COLONOSCOPY     2 yrs ago  . EYE SURGERY     cataracts bilat  . FRACTURE SURGERY     thoracic spine  . JOINT REPLACEMENT     bilat shoulders  . SIGMOID RESECTION / RECTOPEXY    . SPINAL CORD STIMULATOR INSERTION  11/18/2011   Procedure: LUMBAR SPINAL CORD STIMULATOR INSERTION;  Surgeon: Dahlia Bailiff, MD;  Location: [facility];  Service: Orthopedics;  Laterality: N/A;  Thoracic 9 laminiotomy, SPINAL CORD STIMULATOR PLACEMENT thoracic 9- Thoracic 7   Current Outpatient Medications on File Prior to Visit  Medication Sig Dispense Refill  . alendronate (FOSAMAX) 70 MG tablet TAKE 1 TABLET EVERY 7 DAYS WITH A FULL GLASS OF WATER ON AN EMPTY STOMACH 12 tablet 4  . aspirin 81 MG chewable tablet Chew 81 mg by mouth every other day.    Marland Kitchen BREO ELLIPTA 100-25 MCG/INH AEPB USE 1 INHALATION DAILY 180 each 3  .  Calcium Carbonate-Vitamin D (CALCIUM + D) 600-200 MG-UNIT TABS Take 1 tablet by mouth daily.    . cetirizine (ZYRTEC) 10 MG tablet TAKE 1 TABLET DAILY 90 tablet 3  . cyanocobalamin 2000 MCG tablet Take 2,000 mcg by mouth daily.    Marland Kitchen losartan (COZAAR) 50 MG tablet Take 1 tablet (50 mg total) by mouth daily. 90 tablet 2  . Melatonin 10 MG TABS Take 1 tablet by mouth at bedtime.    . nortriptyline (PAMELOR) 25 MG capsule Take 1 capsule (25 mg total) by mouth at bedtime. 90 capsule 3  . Omega-3 Fatty Acids (FISH OIL) 1200 MG CPDR Take 4 capsules by mouth daily.    Marland Kitchen Plecanatide (TRULANCE) 3 MG TABS Take 3 mg by mouth daily.    Marland Kitchen Propylene Glycol (SYSTANE BALANCE OP) Apply 1 drop to eye as needed. For dry eyes    . ranitidine (ZANTAC) 150 MG tablet Take 1 tablet (150 mg total) by mouth 2 (two) times daily. 180 tablet 3   No current facility-administered medications on file prior to visit.    Allergies  Allergen Reactions  . Cymbalta [Duloxetine Hcl] Other (See Comments)    Makes me lethargic  . Lyrica [Pregabalin] Other (See Comments)    Makes me lethargic  . Ranitidine Diarrhea  . Celebrex [Celecoxib] Rash  . Naprosyn [Naproxen] Rash   Social History   Socioeconomic History  . Marital status: Married    Spouse name: Not on file  . Number of children: 3  . Years of education: Not on file  . Highest education level: Not on file  Social Needs  . Financial resource strain: Not on file  . Food insecurity - worry: Not on file  . Food insecurity - inability: Not on file  . Transportation needs - medical: Not on file  . Transportation needs - non-medical: Not on file  Occupational History  . Not on file  Tobacco Use  . Smoking status: Former Smoker    Last attempt to quit: 10/11/1977    Years since quitting: 40.1  . Smokeless tobacco: Never Used  Substance and Sexual Activity  . Alcohol use: No  . Drug use: Yes    Types: Oxycodone  . Sexual activity: Not on file  Other Topics  Concern  . Not on file  Social History Narrative   Lives at home with husband.     Review of Systems  All other systems reviewed and are negative.      Objective:   Physical Exam  Constitutional: She appears well-developed and well-nourished.  Cardiovascular: Normal rate, regular rhythm and normal heart sounds.   No murmur heard. Pulmonary/Chest: Effort normal and breath sounds normal. No respiratory distress. She has no wheezes. She has no rales.  Abdominal: Soft. Bowel sounds are normal.  Musculoskeletal:       Thoracic back: She exhibits decreased range of motion, tenderness, bony  tenderness and pain.       Lumbar back: She exhibits decreased range of motion, tenderness, bony tenderness and pain.  Vitals reviewed.        Assessment & Plan:    DDD (degenerative disc disease), lumbar  Spinal stenosis of lumbar region without neurogenic claudication  Neuropathic pain    I refilled the patient's Percocet today.  She uses 1-1-1/2 tablets a day.  We have discussed the risk of falls and constipation but she requires the medication for pain control.  I will increase nortriptyline to 25 mg p.o. twice daily for chronic pain/neuropathic pain.  I have been happy with the way the medicine is helping her sleep.  Hopefully taking another tablet in the morning will help with neuropathic pain during the daytime without causing too many side effects.  I have recommended a corn removal pad be applied daily to the dorsal surface of the fifth IP joint until healed.  I recommended applying Compound W in the webspace between the fourth and fifth toes to treat the keratinaceous papule and then keep a cotton ball in between the toes to prevent rubbing and friction in the  future.

## 2018-02-03 ENCOUNTER — Ambulatory Visit (INDEPENDENT_AMBULATORY_CARE_PROVIDER_SITE_OTHER): Payer: Medicare Other | Admitting: Family Medicine

## 2018-02-03 ENCOUNTER — Encounter: Payer: Self-pay | Admitting: Family Medicine

## 2018-02-03 VITALS — BP 160/68 | HR 99 | Temp 98.6°F | Resp 16 | Ht <= 58 in | Wt 143.0 lb

## 2018-02-03 DIAGNOSIS — M48061 Spinal stenosis, lumbar region without neurogenic claudication: Secondary | ICD-10-CM

## 2018-02-03 DIAGNOSIS — R51 Headache: Secondary | ICD-10-CM

## 2018-02-03 DIAGNOSIS — M792 Neuralgia and neuritis, unspecified: Secondary | ICD-10-CM

## 2018-02-03 DIAGNOSIS — R519 Headache, unspecified: Secondary | ICD-10-CM

## 2018-02-03 DIAGNOSIS — M5136 Other intervertebral disc degeneration, lumbar region: Secondary | ICD-10-CM | POA: Diagnosis not present

## 2018-02-06 MED ORDER — OXYCODONE-ACETAMINOPHEN 10-325 MG PO TABS: ORAL_TABLET | ORAL | 0 refills | Status: DC

## 2018-02-06 NOTE — Progress Notes (Signed)
Subjective:    Patient ID: Brianna Jacobs, female    DOB: 11/08/1929, 84 y.o.   MRN: [id number]  HPI 01/05/16 Had MRI 2011 of lumbar spine:  T11-T12: Retropulsion produces mild central stenosis. Flattening the ventral aspect of the thoracic cord. T12-L1: Desiccated disc with broad-based posterior protrusion. Mild bilateral facet hypertrophy and ligamentum flavum redundancy without significant stenosis. L1-L2: Disc desiccation with broad-based posterior disc protrusion. Mild central stenosis. Mild bilateral facet hypertrophy. Foramina and lateral recesses patent. L2-L3: Severely degenerated and desiccated disc with degenerative endplate changes. Right greater than left facet hypertrophy. Central canal and lateral recesses patent. Moderate right foraminal stenosis associated with scoliosis and osteophytes. Right lateral disc protrusion and osteophyte could affect the exiting right  L2 nerve root. Lateral protrusion and osteophyte are new compared to prior  exam. L3-L4: Degenerated disc. Left eccentric broad-based posterior disc protrusion extending into the left neural foramen. Bilateral facet hypertrophy with moderate central stenosis. Left lateral recess stenosis. Moderate right foraminal stenosis with mild left foraminal stenosis. L4-L5: Severely degenerated and desiccated disc with endplate changes. Severe bilateral facet arthrosis and ligamentum flavum redundancy. Left foraminal disc protrusion and severe left foraminal stenosis with compression of the exiting left L4 nerve. Left greater than right bilateral subarticular lateral recess stenosis. Minimal right foraminal stenosis. Mild to moderate central stenosis. L5-S1: Grade 1 anterolisthesis with uncoverage of the disc and a broad-based protrusion. Bilateral lateral recess stenosis. Severe bilateral facet arthrosis with bilateral facet effusions. Mild central stenosis. Severe bilateral foraminal stenosis with compression of both L5 nerve roots.  Patient has a long-standing history of lower back problems as evidenced in the MRI report from 2011 listed above. She also has a history of 2 separate vertebral fractures in the thoracic spine show normal x-ray of the thoracic spine in 2013. However this weekend, the patient stumbled and fell in a restaurant. Shortly thereafter, she developed severe sharp stabbing pain up and down the thoracic spine. The pain is primarily located in the paraspinal areas on either side of thoracic spine. She denies any new numbness or weakness in the right leg. However there is some new numbness and tingling located on the lateral aspect of the left thigh. She has chronic weakness in both legs and chronic neuropathy in both legs. There is no tenderness to palpation over the spinous processes of the thoracic spine or in the thoracic spine paraspinal muscles however she states the pain is most intense it has been in years.  At that time, my plan  was: Differential diagnosis includes herniated disc in the thoracic spine after a fall versus vertebral fracture in the thoracic spine after a fall. I like the patient to go immediately for an x-ray of her thoracic spine. If there is a vertebral fracture, we can discuss possible kyphoplasty versus pain management. However if there is no evidence of a new vertebral fracture, I will treat the patient empirically as possibly having a herniated disc with a prednisone taper pack. I will await the results of the x-ray of the thoracic spine.  01/12/16 Patient is here today for follow-up. The pain in the center of her back is intensifying. It is 9 on a scale of 10. She is unable to walk without a walker. She has fallen 4 separate times due to the pain in her back and the worsening weakness in both legs. She also reports worsening numbness in both legs. The pain is primarily between the levels of T7 and T12. She is able to stand from a seated position with assistance. However she screams in pain when she does this. X-rays of the thoracic spine reveal degenerative disc disease, levoscoliosis, and stable old compression fractures with no acute changes. Prednisone taper pack was tried without relief.  At that time, my plan was: I'm concerned the patient may have spinal stenosis developing in the thoracic spine. She is unable to have an MRI of the thoracic spine due to the presence of a spinal cord stimulator. Therefore I'll schedule the patient for CT of the thoracic spine to characterize further. Meanwhile she can use fentanyl 25 g per hour every 72 hours and use oxycodone for breakthrough pain.  02/20/16 CT revealed: 1. New, from 2015, T7 compression fracture with minimal  height loss and no retropulsion. An acute fracture line is not seen but given the history and subtle perispinal edema favor subacute timing. 2. Remote T9, T10, T12, and L1 compression fractures as described.  Patient underwent T7 kyphoplasty and  vertebroplasty 4/25.  I also recommended fosamax.  Patient states the pain is worse. She now screams in pain whenever she moves to get out of a chair. She denies any paralysis in her legs. She does state that the numbness in her legs is getting worse. Weakness in her legs is not getting worse. She denies any bowel or bladder incontinence. She denies any saddle anesthesia. She denies any shortness of breath although she states it hurts to take a deep breath in. She also complains of pain and difficulty and dysphasia/trouble swallowing. She blames this on the addition of Fosamax.  At that time, my plan was: Very complicated situation. I explained to the patient I will focus on the pain first try to manage and control her pain and then see how many of her residual symptoms including dysphagia and trouble breathing persist. Increase fentanyl from 25 g per hour to 75 g per hour. Continue use oxycodone 10/325 one every 6 hours as needed. Add calcitonin nasal spray daily for her acute thoracic compression fracture. Recheck in one week. If her pain is better and she continues to have dysphasia discontinue Fosamax and begin GI workup. I would replace Fosamax with prolia. I see no evidence of an infection, cauda equina syndrome, or pulmonary embolism  03/18/16 Patient presents today complaining of severe pain in her back. She does think the fentanyl is helping. She is using Percocet 3-4 times a day. She uses a half a tablet to a whole tablet each time. She never started taking calcitonin due to concern regarding the side effects. She also reports worsening neuropathy in her right leg. Burning and stinging pain will wake her up at night radiating from her right thigh down to her right foot. The weakness in her legs is not getting worse. She denies any symptoms of cauda equina syndrome.  At that  Time, my plan was: Continue fentanyl 75 g every 72 hours. She will continue to use Percocet 10/325 1/2-1 tablet every 6 hours  as needed for pain. She has tried and failed gabapentin, Lyrica, Cymbalta, and amitriptyline for neuropathic pain. The only other option would know to try would be trokendi off label.  At the present time she is not interested in trying this. We will discontinue calcitonin as she is afraid of the side effects. The dysphagia is not getting worse and therefore we will discontinue the Carafate. Since the dysphagia is not getting worse, I will continue to have her on Fosamax given the multiple vertebral fractures she is having.  04/15/16 Patient is here today complaining of severe mid back pain all throughout her thoracic spine. Patient reports severe sharp pain with rotation of her trunk, with flexion, with extension of her spine. She has a difficult time getting up from a seated position. She has a difficult time sitting down. She has difficult time riding in a car. Any movement causes her to scream in pain. Fentanyl patches and Percocet every 6 hours is not providing her significant relief. She never started the calcitonin nasal spray.  AT that time, my plan was: Given her advanced age, I am hesitant to increase her narcotics higher due to potential side effects.  I will rule out other potential causes of back pain by obtaining  an SPEP and UPEP although I believe her pain is most likely due to the compression fractures in her thoracic spine coupled with her lumbar degenerative disc disease and mild to moderate spinal stenosis.  Therefore I will consult the pain clinic to better try to manage her pain as I have been unsuccessful.  05/25/16 Patient has yet to see the pain clinic. Fortunately however it seems like her pain is improving. She is still not using calcitonin. She is here today because she would like to go over all the medication she is taking. I reviewed with her the med list in detail. I explained to her the calcitonin is simply for bone pain. She can certainly stop that and discard the medication if  she does not feel it's beneficial. I explained to the patient that Carafate is intended to treat ulcers and gastritis. At the present time she denies any symptoms of these. She is already on pantoprazole as well as Zantac which seemed to control her reflux. However she still complains of dysphasia. She also complains of food sticking in her upper esophagus at times. I recommended that she follow-up with her gastroenterologist as she may need an EGD and dilatation which she has had in the past.  Her blood pressure today is elevated. She is on losartan 50 mg by mouth daily she states that this works well but she has not been checking her blood pressure at home.  AT that time, my plan was: Patient is scheduling herself to meet with a neurosurgeon to discuss other options for pain control regarding her mid back pain. Fortunately this is improving. I recommended she discontinue calcitonin as she has been sporadic and using the medication. Also recommended that she discontinue Carafate as her symptoms now do not suggest gastritis or an ulcer. However she is having dysphasia so I recommended that she call her gastroenterologist and arrange a follow-up to discuss possible repeat EGD. Her blood pressure today is elevated. I recommended that she check her blood pressure frequently over the next week and then provide the values to me to review. If consistently greater than 140/90, I would increase her losartan to 100 mg a day  09/20/16 Patient is here today to discuss her back pain. She's been doing very well on the fentanyl 75 g every 72 hours. She is usually only requiring one Percocet at night to help her sleep. However I will see I am very concerned about risk and an 84 year old female continuing fentanyl long-term. She is here today to discuss this further. She continues to have pain in her thoracic spine and her lumbar spine on a daily basis. She continues to have neuropathic pain in both legs. There is no evidence  of cauda equina syndrome but rather only persistent pain. At the present time the pain is not interfering with any activity of daily living.  Atthat time, my plan was: We discussed the risk of narcotic overdose given her advanced age on high-dose fentanyl. We will wean the patient down to 25 g every 72 hours to avoid withdrawal given the fact the patient is been on this medication from a 7 months. She can continue to use the Percocet for breakthrough pain as needed. In one month, if the patient is doing well we will discontinue fentanyl and replaced with Percocet 10/325 po q 6 hrs prn (120/month).  02/24/17 Patient has a history of chronic back pain is well outlined above. She reports pain in her neck mid back, and lower  back. It is exacerbated by everyday movement. She also has peripheral neuropathy in her legs. She is successfully discontinued fentanyl and still takes Percocet but does so rarely only for breakthrough pain. She is here today to discuss other options for treatment of her pain. She believes she may have fibromyalgia. I disagree. I believe there is a biomechanical mechanism of her chronic back pain beyond that explained by fibromyalgia. She is interested in seeing a chiropractor for "lasera" treatments. Essentially this uses a laser to "cause vasodilation to improve local blood flow and local oxygen levels to improve chronic pain". At that time, my plan was: My heart goes out for this patient. She is in chronic pain. Medicine has very little to offer her. She is tried a spinal cord stimulator with no benefit. She's tried epidural steroid injections with no benefit. She is a poor surgical candidate given her severe osteoporosis. She is currently taking regular narcotics to manage her back pain. I explained to the patient that I do not believe that the [place] therapy is FDA approved. Therefore I'm not an advocate for this. However the patient is very hopeful because she's had a friend who has tried  it and is seeing significant benefit. I see no medical contraindications to her receiving this treatment and therefore I'm fine with her trying it as long as she realizes that it has not been rigorously studied by the FDA. She'll receive this treatment through a local chiropractor's office.   08/05/17 Patient is a difficult historian.  She is extremely sweet 84 year old white female here with multiple concerns.  I tried very hard to agenda set with her to focus her issues.  Her biggest concerns are (1) 2 weeks of rhinorrhea, postnasal drip, and cough productive of yellow sputum.(2) a trigger finger in her left third MCP joint that has been present for 2 weeks, (3) pain in her mid back on the left side that has been present for 2 weeks and is exacerbated by coughing.  After exhausting her concerns, I then proceeded to perform history and physical on each of these 3 issues.  At which point she also asked about pain in her neck, her left arm, and peripheral neuropathy.  At that point I had to redirect the patient back to her initial 3 complaints because we did not have sufficient time to tackle all of her concerns.  Regarding problem #1, she has had an upper respiratory infection for 2 weeks.  Symptoms include rhinorrhea, postnasal drip, scratchy throat, chest congestion, and cough productive of yellow sputum.  She denies any chest pain or shortness of breath or fever.  She denies any sinus pain.  She is taking Mucinex and Robitussin and cetirizine.  Regarding problem #2, whenever she flexes her left third MCP joint and makes a fist, she is unable to extend her PIP joint without forcibly extending it.  She has pain on the palmar surface of the MCP joint.  Regarding problem #3, ever since she had her upper respiratory infection and has been coughing, she has developed pain in the mid back on the left side just below the scapula that is made worse by coughing twisting turning and bending.  09/12/17 Patient is here  today asking if I have any other options to treat her chronic pain.  She reports burning stinging aching pain in both legs from the soles of her feet up to her mid thighs.  It is equal and symmetric bilaterally.  It is constant and severe.  It keeps her from sleeping at night.  She tries to limit the oxycodone is much as possible.  Occasionally she only takes the medication once or twice a day and a half a tablet at a time.  However this is providing her little if any pain relief.  Gabapentin provided her no relief.  She was unable to tolerate Lyrica and/or Cymbalta.  He has never tried nortriptyline.  She has never tried Topamax.  She also continues to complain of pain and a trigger finger in her left hand at the third MCP joint.  However there also appears to be a contracture developing in the flexor tendon proximal to the third MCP joint.  Therefore I am uncertain whether this is simply just a trigger finger or also mild Dupuytren's contracture.  At that time, my plan was: I spent more than 30 minutes today with the patient discussing her previous history and the medications we have tried and failed in the past.  I recommended trying nortriptyline 25 mg p.o. nightly and then reassessing in 3-4 weeks if she sees improvement.  Could consider the combination of nortriptyline with gabapentin if benefit is seen.  Meanwhile I will consult a hand surgeon regarding the trigger finger of her third MCP joint as I believe this may also be complicated by mild early Dupuytren's contracture.  Reassess in 3-4 weeks.  I did refill her oxycodone today   12/02/17 Patient is here today reporting neuropathic pain in both legs up to her mid thighs.  Please see the discussion above.  This is a chronic problem for this patient.  When I questioned her about nortriptyline, she states that the medication really helps her sleep however it is doing very little to help her pain during the morning and afternoon.  However she denies any  constipation or dry mouth or dizziness.  She is still using oxycodone.  She takes 1/2-1 tablet every night and may be a half a tablet during the daytime as needed for neuropathic pain.  We have discussed multiple times weaning off the narcotic pain medication however she has been unable to do so due to the pain in her back and in her legs.  She also complains of a corn on the lateral dorsal aspect of the fifth IP joint of the right foot.  There is a 4 mm corn there.  Also in between the fourth and fifth toes, there is a thick hard keratinaceous papule that is painful that appears to be a callus versus corn.  At that time, my plan was: I refilled the patient's Percocet today.  She uses 1-1-1/2 tablets a day.  We have discussed the risk of falls and constipation but she requires the medication for pain control.  I will increase nortriptyline to 25 mg p.o. twice daily for chronic pain/neuropathic pain.  I have been happy with the way the medicine is helping her sleep.  Hopefully taking another tablet in the morning will help with neuropathic pain during the daytime without causing too many side effects.  I have recommended a corn removal pad be applied daily to the dorsal surface of the fifth IP joint until healed.  I recommended applying Compound W in the webspace between the fourth and fifth toes to treat the keratinaceous papule and then keep a cotton ball in between the toes to prevent rubbing and friction in the future.   02/06/18 Patient is here today with her husband.  She is interested in surgery to remove the  spinal cord stimulator that she has.  She is also interested in having it replaced with a newer model.  Apparently she has discussed this with the facility that updates and monitors her spinal cord stimulator.  However after discussion today, the patient states she has seen no benefit in her pain since the insertion of the spinal cord stimulator.  Therefore I am not certain that replacing it with a  newer model would provide her any relief.  I have recommended that she discuss this with Dr. Rolena Infante who is the physician who inserted the spinal cord stimulator.  Furthermore given her advanced age, she would be a high surgical risk simply from the standpoint of her age and recovery and potential postoperative complications.  Therefore I recommended to the patient that she only consider another surgery if her surgeon believes there is a good chance of improved pain control.  She continues to complain about chronic neuropathic pain and numbness now extending from her toes to her mid thighs.  This is been a chronic problem for the patient as outlined above and she is tried and failed numerous medications to manage this.  She also reports headaches now in her occiput.  Apparently in November, the patient fell and struck her head on a brick wall at a local pool.  Although there was no loss of consciousness or headache at the time other than some scalp soreness, the patient reports chronic daily headaches now originating in her occiput.  She denies any blurry vision.  She denies any dizziness.  She denies any loss of consciousness.  She denies any neurologic deficits other than the numbness, tingling, and pain in her legs Past Medical History:  Diagnosis Date  . ADD (attention deficit disorder)   . Arthritis   . Asthma    occassional usage of inhalers  . Bowel obstruction ([place])   . Cancer [facility])    colon cancer, 16 yrs ago  . Cataract   . Depression   . GERD (gastroesophageal reflux disease)   . Hypertension   . Neuromuscular disorder ([facility])    neuropathy  . Osteoporosis   . Scoliosis   . Spinal stenosis    Past Surgical History:  Procedure Laterality Date  . ABDOMINAL HYSTERECTOMY  1972   partial  . COLONOSCOPY     2 yrs ago  . EYE SURGERY     cataracts bilat  . FRACTURE SURGERY     thoracic spine  . JOINT REPLACEMENT     bilat shoulders  . SIGMOID RESECTION / RECTOPEXY    . SPINAL CORD  STIMULATOR INSERTION  11/18/2011   Procedure: LUMBAR SPINAL CORD STIMULATOR INSERTION;  Surgeon: Dahlia Bailiff, MD;  Location: [facility];  Service: Orthopedics;  Laterality: N/A;  Thoracic 9 laminiotomy, SPINAL CORD STIMULATOR PLACEMENT thoracic 9- Thoracic 7   Current Outpatient Medications on File Prior to Visit  Medication Sig Dispense Refill  . alendronate (FOSAMAX) 70 MG tablet TAKE 1 TABLET EVERY 7 DAYS WITH A FULL GLASS OF WATER ON AN EMPTY STOMACH 12 tablet 4  . aspirin 81 MG chewable tablet Chew 81 mg by mouth every other day.    Marland Kitchen BREO ELLIPTA 100-25 MCG/INH AEPB USE 1 INHALATION DAILY 180 each 3  . Calcium Carbonate-Vitamin D (CALCIUM + D) 600-200 MG-UNIT TABS Take 1 tablet by mouth daily.    . cetirizine (ZYRTEC) 10 MG tablet TAKE 1 TABLET DAILY 90 tablet 3  . cyanocobalamin 2000 MCG tablet Take 2,000 mcg by mouth  daily.    . losartan (COZAAR) 50 MG tablet Take 1 tablet (50 mg total) by mouth daily. 90 tablet 2  . Melatonin 10 MG TABS Take 1 tablet by mouth at bedtime.    . nortriptyline (PAMELOR) 25 MG capsule Take 1 capsule (25 mg total) by mouth at bedtime. 90 capsule 3  . Omega-3 Fatty Acids (FISH OIL) 1200 MG CPDR Take 4 capsules by mouth daily.    Marland Kitchen Plecanatide (TRULANCE) 3 MG TABS Take 3 mg by mouth daily.    Marland Kitchen Propylene Glycol (SYSTANE BALANCE OP) Apply 1 drop to eye as needed. For dry eyes    . ranitidine (ZANTAC) 150 MG tablet Take 1 tablet (150 mg total) by mouth 2 (two) times daily. 180 tablet 3   No current facility-administered medications on file prior to visit.    Allergies  Allergen Reactions  . Cymbalta [Duloxetine Hcl] Other (See Comments)    Makes me lethargic  . Lyrica [Pregabalin] Other (See Comments)    Makes me lethargic  . Ranitidine Diarrhea  . Celebrex [Celecoxib] Rash  . Naprosyn [Naproxen] Rash   Social History   Socioeconomic History  . Marital status: Married    Spouse name: Not on file  . Number of children: 3  . Years of education: Not on  file  . Highest education level: Not on file  Occupational History  . Not on file  Social Needs  . Financial resource strain: Not on file  . Food insecurity:    Worry: Not on file    Inability: Not on file  . Transportation needs:    Medical: Not on file    Non-medical: Not on file  Tobacco Use  . Smoking status: Former Smoker    Last attempt to quit: 10/11/1977    Years since quitting: 40.3  . Smokeless tobacco: Never Used  Substance and Sexual Activity  . Alcohol use: No  . Drug use: Yes    Types: Oxycodone  . Sexual activity: Not on file  Lifestyle  . Physical activity:    Days per week: Not on file    Minutes per session: Not on file  . Stress: Not on file  Relationships  . Social connections:    Talks on phone: Not on file    Gets together: Not on file    Attends religious service: Not on file    Active member of club or organization: Not on file    Attends meetings of clubs or organizations: Not on file    Relationship status: Not on file  . Intimate partner violence:    Fear of current or ex partner: Not on file    Emotionally abused: Not on file    Physically abused: Not on file    Forced sexual activity: Not on file  Other Topics Concern  . Not on file  Social History Narrative   Lives at home with husband.     Review of Systems  All other systems reviewed and are negative.      Objective:   Physical Exam  Constitutional: She appears well-developed and well-nourished.  Cardiovascular: Normal rate, regular rhythm and normal heart sounds.   No murmur heard. Pulmonary/Chest: Effort normal and breath sounds normal. No respiratory distress. She has no wheezes. She has no rales.  Abdominal: Soft. Bowel sounds are normal.  Musculoskeletal:       Thoracic back: She exhibits decreased range of motion, tenderness, bony tenderness and pain.  Lumbar back: She exhibits decreased range of motion, tenderness, bony tenderness and pain.  Vitals  reviewed.        Assessment & Plan:    DDD (degenerative disc disease), lumbar  Spinal stenosis of lumbar region without neurogenic claudication  Neuropathic pain  Chronic daily headache - Plan: CT Head Wo Contrast  Chronic nonintractable headache, unspecified headache type - Plan: CT Head Wo Contrast  I have recommended that the patient schedule an appointment with Dr. Rolena Infante to discuss surgical removal of her spinal cord stimulator and replacement with a updated model.  I recommended that she discuss the success rate of the knee or spinal cord stimulator compared to the older version and whether Dr. Rolena Infante feels that this surgery would be beneficial given her age, and relative surgical risk.  Given her chronic daily headache after occipital trauma, I would like to obtain a CT scan to evaluate for signs of a possible subdural hematoma after the fall.

## 2018-02-08 ENCOUNTER — Ambulatory Visit
Admission: RE | Admit: 2018-02-08 | Discharge: 2018-02-08 | Disposition: A | Discharge status: Home / Self Care | Payer: Medicare Other | Source: Ambulatory Visit | Attending: Family Medicine | Admitting: Family Medicine

## 2018-02-08 DIAGNOSIS — R519 Headache, unspecified: Secondary | ICD-10-CM

## 2018-02-08 DIAGNOSIS — R51 Headache: Secondary | ICD-10-CM | POA: Diagnosis not present

## 2018-02-09 ENCOUNTER — Encounter (INDEPENDENT_AMBULATORY_CARE_PROVIDER_SITE_OTHER): Payer: Self-pay

## 2018-02-14 ENCOUNTER — Other Ambulatory Visit: Payer: Self-pay | Admitting: Family Medicine

## 2018-02-14 DIAGNOSIS — M5136 Other intervertebral disc degeneration, lumbar region: Secondary | ICD-10-CM

## 2018-02-27 DIAGNOSIS — M545 Low back pain: Secondary | ICD-10-CM | POA: Diagnosis not present

## 2018-03-07 ENCOUNTER — Encounter: Payer: Self-pay | Admitting: Family Medicine

## 2018-03-11 ENCOUNTER — Other Ambulatory Visit: Payer: Self-pay | Admitting: Family Medicine

## 2018-04-28 DIAGNOSIS — T85193A Other mechanical complication of implanted electronic neurostimulator, generator, initial encounter: Secondary | ICD-10-CM | POA: Diagnosis not present

## 2018-04-28 DIAGNOSIS — T85840A Pain due to nervous system prosthetic devices, implants and grafts, initial encounter: Secondary | ICD-10-CM | POA: Diagnosis not present

## 2018-05-08 ENCOUNTER — Other Ambulatory Visit: Payer: Self-pay | Admitting: *Deleted

## 2018-05-08 MED ORDER — OXYCODONE-ACETAMINOPHEN 10-325 MG PO TABS: ORAL_TABLET | ORAL | 0 refills | Status: DC

## 2018-05-08 NOTE — Telephone Encounter (Signed)
Ok to refill??  Last office visit 02/03/2018.  Last refill 02/06/2018.

## 2018-06-09 ENCOUNTER — Other Ambulatory Visit: Payer: Self-pay | Admitting: Family Medicine

## 2018-06-09 DIAGNOSIS — M810 Age-related osteoporosis without current pathological fracture: Secondary | ICD-10-CM

## 2018-07-13 ENCOUNTER — Ambulatory Visit (INDEPENDENT_AMBULATORY_CARE_PROVIDER_SITE_OTHER): Payer: Medicare Other | Admitting: Family Medicine

## 2018-07-13 ENCOUNTER — Ambulatory Visit
Admission: RE | Admit: 2018-07-13 | Discharge: 2018-07-13 | Disposition: A | Discharge status: Home / Self Care | Payer: Medicare Other | Source: Ambulatory Visit | Attending: Family Medicine | Admitting: Family Medicine

## 2018-07-13 ENCOUNTER — Encounter: Payer: Self-pay | Admitting: Family Medicine

## 2018-07-13 VITALS — BP 150/78 | HR 105 | Temp 98.3°F | Resp 14 | Ht <= 58 in | Wt 143.0 lb

## 2018-07-13 DIAGNOSIS — M542 Cervicalgia: Secondary | ICD-10-CM | POA: Diagnosis not present

## 2018-07-13 DIAGNOSIS — M549 Dorsalgia, unspecified: Secondary | ICD-10-CM

## 2018-07-13 DIAGNOSIS — Z23 Encounter for immunization: Secondary | ICD-10-CM | POA: Diagnosis not present

## 2018-07-13 DIAGNOSIS — D649 Anemia, unspecified: Secondary | ICD-10-CM | POA: Diagnosis not present

## 2018-07-13 DIAGNOSIS — I499 Cardiac arrhythmia, unspecified: Secondary | ICD-10-CM

## 2018-07-13 DIAGNOSIS — S22080A Wedge compression fracture of T11-T12 vertebra, initial encounter for closed fracture: Secondary | ICD-10-CM | POA: Diagnosis not present

## 2018-07-13 DIAGNOSIS — M47812 Spondylosis without myelopathy or radiculopathy, cervical region: Secondary | ICD-10-CM | POA: Diagnosis not present

## 2018-07-13 DIAGNOSIS — R6889 Other general symptoms and signs: Secondary | ICD-10-CM | POA: Diagnosis not present

## 2018-07-13 NOTE — Progress Notes (Signed)
Subjective:    Patient ID: Brianna Jacobs, female    DOB: 1930-06-15, 84 y.o.   MRN: [id number]  HPI 01/05/16 Had MRI 2011 of lumbar spine:  T11-T12: Retropulsion produces mild central stenosis. Flattening the ventral aspect of the thoracic cord. T12-L1: Desiccated disc with broad-based posterior protrusion. Mild bilateral facet hypertrophy and ligamentum flavum redundancy without significant stenosis. L1-L2: Disc desiccation with broad-based posterior disc protrusion. Mild central stenosis. Mild bilateral facet hypertrophy. Foramina and lateral recesses patent. L2-L3: Severely degenerated and desiccated disc with degenerative endplate changes. Right greater than left facet hypertrophy. Central canal and lateral recesses patent. Moderate right foraminal stenosis associated with scoliosis and osteophytes. Right lateral disc protrusion and osteophyte could affect the exiting right L2 nerve root. Lateral protrusion and osteophyte are new compared to prior exam. L3-L4: Degenerated disc. Left eccentric broad-based posterior disc protrusion extending into the left neural foramen. Bilateral facet hypertrophy with moderate central stenosis. Left lateral recess stenosis. Moderate right foraminal stenosis with mild left foraminal stenosis. L4-L5: Severely degenerated and desiccated disc with endplate changes. Severe bilateral facet arthrosis and ligamentum flavum redundancy. Left foraminal disc protrusion and severe left foraminal stenosis with compression of the exiting left L4 nerve. Left greater than right bilateral subarticular lateral recess stenosis. Minimal right foraminal stenosis. Mild to moderate central stenosis. L5-S1: Grade 1 anterolisthesis with uncoverage of the disc and a broad-based protrusion. Bilateral lateral recess stenosis. Severe bilateral facet arthrosis with bilateral facet effusions. Mild central stenosis. Severe bilateral  foraminal stenosis with compression of both L5 nerve roots.  Patient has a long-standing history of lower back problems as evidenced in the MRI report from 2011 listed above. She also has a history of 2 separate vertebral fractures in the thoracic spine show normal x-ray of the thoracic spine in 2013. However this weekend, the patient stumbled and fell in a restaurant. Shortly thereafter, she developed severe sharp stabbing pain up and down the thoracic spine. The pain is primarily located in the paraspinal areas on either side of thoracic spine. She denies any new numbness or weakness in the right leg. However there is some new numbness and tingling located on the lateral aspect of the left thigh. She has chronic weakness in both legs and chronic neuropathy in both legs. There is no tenderness to palpation over the spinous processes of the thoracic spine or in the thoracic spine paraspinal muscles however she states the pain is most intense it has been in years.  At that time, my plan was: Differential diagnosis includes herniated disc in the thoracic spine after a fall versus vertebral fracture in the thoracic spine after a fall. I like the patient to go immediately for an x-ray of her thoracic spine. If there is a vertebral fracture, we can discuss possible kyphoplasty versus pain management. However if there is no evidence of a new vertebral fracture, I will treat the patient empirically as possibly having a herniated disc with a prednisone taper pack. I will await the results of the x-ray of the thoracic spine.  01/12/16 Patient is here today for follow-up. The pain in the center of her back is intensifying. It is 9 on a scale of 10. She is unable to walk without a walker. She has fallen 4 separate times due to the pain in her back and the worsening weakness in both legs. She also reports worsening numbness in both legs. The pain is primarily between the levels of T7 and T12. She is able  to stand from a  seated position with assistance. However she screams in pain when she does this. X-rays of the thoracic spine reveal degenerative disc disease, levoscoliosis, and stable old compression fractures with no acute changes. Prednisone taper pack was tried without relief.  At that time, my plan was: I'm concerned the patient may have spinal stenosis developing in the thoracic spine. She is unable to have an MRI of the thoracic spine due to the presence of a spinal cord stimulator. Therefore I'll schedule the patient for CT of the thoracic spine to characterize further. Meanwhile she can use fentanyl 25 g per hour every 72 hours and use oxycodone for breakthrough pain.  02/20/16 CT revealed: 1. New, from 2015, T7 compression fracture with minimal height loss and no retropulsion. An acute fracture line is not seen but given the history and subtle perispinal edema favor subacute timing. 2. Remote T9, T10, T12, and L1 compression fractures as described.  Patient underwent T7 kyphoplasty and vertebroplasty 4/25.  I also recommended fosamax.  Patient states the pain is worse. She now screams in pain whenever she moves to get out of a chair. She denies any paralysis in her legs. She does state that the numbness in her legs is getting worse. Weakness in her legs is not getting worse. She denies any bowel or bladder incontinence. She denies any saddle anesthesia. She denies any shortness of breath although she states it hurts to take a deep breath in. She also complains of pain and difficulty and dysphasia/trouble swallowing. She blames this on the addition of Fosamax.  At that time, my plan was: Very complicated situation. I explained to the patient I will focus on the pain first try to manage and control her pain and then see how many of her residual symptoms including dysphagia and trouble breathing persist. Increase fentanyl from 25 g per hour to 75 g per hour. Continue use oxycodone 10/325 one every 6 hours as  needed. Add calcitonin nasal spray daily for her acute thoracic compression fracture. Recheck in one week. If her pain is better and she continues to have dysphasia discontinue Fosamax and begin GI workup. I would replace Fosamax with prolia. I see no evidence of an infection, cauda equina syndrome, or pulmonary embolism  03/18/16 Patient presents today complaining of severe pain in her back. She does think the fentanyl is helping. She is using Percocet 3-4 times a day. She uses a half a tablet to a whole tablet each time. She never started taking calcitonin due to concern regarding the side effects. She also reports worsening neuropathy in her right leg. Burning and stinging pain will wake her up at night radiating from her right thigh down to her right foot. The weakness in her legs is not getting worse. She denies any symptoms of cauda equina syndrome.  At that  Time, my plan was: Continue fentanyl 75 g every 72 hours. She will continue to use Percocet 10/325 1/2-1 tablet every 6 hours as needed for pain. She has tried and failed gabapentin, Lyrica, Cymbalta, and amitriptyline for neuropathic pain. The only other option would know to try would be trokendi off label.  At the present time she is not interested in trying this. We will discontinue calcitonin as she is afraid of the side effects. The dysphagia is not getting worse and therefore we will discontinue the Carafate. Since the dysphagia is not getting worse, I will continue to have her on Fosamax given the multiple vertebral fractures  she is having.  04/15/16 Patient is here today complaining of severe mid back pain all throughout her thoracic spine. Patient reports severe sharp pain with rotation of her trunk, with flexion, with extension of her spine. She has a difficult time getting up from a seated position. She has a difficult time sitting down. She has difficult time riding in a car. Any movement causes her to scream in pain. Fentanyl patches and  Percocet every 6 hours is not providing her significant relief. She never started the calcitonin nasal spray.  AT that time, my plan was: Given her advanced age, I am hesitant to increase her narcotics higher due to potential side effects.  I will rule out other potential causes of back pain by obtaining an SPEP and UPEP although I believe her pain is most likely due to the compression fractures in her thoracic spine coupled with her lumbar degenerative disc disease and mild to moderate spinal stenosis.  Therefore I will consult the pain clinic to better try to manage her pain as I have been unsuccessful.  05/25/16 Patient has yet to see the pain clinic. Fortunately however it seems like her pain is improving. She is still not using calcitonin. She is here today because she would like to go over all the medication she is taking. I reviewed with her the med list in detail. I explained to her the calcitonin is simply for bone pain. She can certainly stop that and discard the medication if she does not feel it's beneficial. I explained to the patient that Carafate is intended to treat ulcers and gastritis. At the present time she denies any symptoms of these. She is already on pantoprazole as well as Zantac which seemed to control her reflux. However she still complains of dysphasia. She also complains of food sticking in her upper esophagus at times. I recommended that she follow-up with her gastroenterologist as she may need an EGD and dilatation which she has had in the past.  Her blood pressure today is elevated. She is on losartan 50 mg by mouth daily she states that this works well but she has not been checking her blood pressure at home.  AT that time, my plan was: Patient is scheduling herself to meet with a neurosurgeon to discuss other options for pain control regarding her mid back pain. Fortunately this is improving. I recommended she discontinue calcitonin as she has been sporadic and using the  medication. Also recommended that she discontinue Carafate as her symptoms now do not suggest gastritis or an ulcer. However she is having dysphasia so I recommended that she call her gastroenterologist and arrange a follow-up to discuss possible repeat EGD. Her blood pressure today is elevated. I recommended that she check her blood pressure frequently over the next week and then provide the values to me to review. If consistently greater than 140/90, I would increase her losartan to 100 mg a day  09/20/16 Patient is here today to discuss her back pain. She's been doing very well on the fentanyl 75 g every 72 hours. She is usually only requiring one Percocet at night to help her sleep. However I will see I am very concerned about risk and an 84 year old female continuing fentanyl long-term. She is here today to discuss this further. She continues to have pain in her thoracic spine and her lumbar spine on a daily basis. She continues to have neuropathic pain in both legs. There is no evidence of cauda equina syndrome but  rather only persistent pain. At the present time the pain is not interfering with any activity of daily living.  Atthat time, my plan was: We discussed the risk of narcotic overdose given her advanced age on high-dose fentanyl. We will wean the patient down to 25 g every 72 hours to avoid withdrawal given the fact the patient is been on this medication from a 7 months. She can continue to use the Percocet for breakthrough pain as needed. In one month, if the patient is doing well we will discontinue fentanyl and replaced with Percocet 10/325 po q 6 hrs prn (120/month).  02/24/17 Patient has a history of chronic back pain is well outlined above. She reports pain in her neck mid back, and lower back. It is exacerbated by everyday movement. She also has peripheral neuropathy in her legs. She is successfully discontinued fentanyl and still takes Percocet but does so rarely only for breakthrough  pain. She is here today to discuss other options for treatment of her pain. She believes she may have fibromyalgia. I disagree. I believe there is a biomechanical mechanism of her chronic back pain beyond that explained by fibromyalgia. She is interested in seeing a chiropractor for "lasera" treatments. Essentially this uses a laser to "cause vasodilation to improve local blood flow and local oxygen levels to improve chronic pain". At that time, my plan was: My heart goes out for this patient. She is in chronic pain. Medicine has very little to offer her. She is tried a spinal cord stimulator with no benefit. She's tried epidural steroid injections with no benefit. She is a poor surgical candidate given her severe osteoporosis. She is currently taking regular narcotics to manage her back pain. I explained to the patient that I do not believe that the [place] therapy is FDA approved. Therefore I'm not an advocate for this. However the patient is very hopeful because she's had a friend who has tried it and is seeing significant benefit. I see no medical contraindications to her receiving this treatment and therefore I'm fine with her trying it as long as she realizes that it has not been rigorously studied by the FDA. She'll receive this treatment through a local chiropractor's office.   08/05/17 Patient is a difficult historian.  She is extremely sweet 84 year old white female here with multiple concerns.  I tried very hard to agenda set with her to focus her issues.  Her biggest concerns are (1) 2 weeks of rhinorrhea, postnasal drip, and cough productive of yellow sputum.(2) a trigger finger in her left third MCP joint that has been present for 2 weeks, (3) pain in her mid back on the left side that has been present for 2 weeks and is exacerbated by coughing.  After exhausting her concerns, I then proceeded to perform history and physical on each of these 3 issues.  At which point she also asked about pain in  her neck, her left arm, and peripheral neuropathy.  At that point I had to redirect the patient back to her initial 3 complaints because we did not have sufficient time to tackle all of her concerns.  Regarding problem #1, she has had an upper respiratory infection for 2 weeks.  Symptoms include rhinorrhea, postnasal drip, scratchy throat, chest congestion, and cough productive of yellow sputum.  She denies any chest pain or shortness of breath or fever.  She denies any sinus pain.  She is taking Mucinex and Robitussin and cetirizine.  Regarding problem #2, whenever she  flexes her left third MCP joint and makes a fist, she is unable to extend her PIP joint without forcibly extending it.  She has pain on the palmar surface of the MCP joint.  Regarding problem #3, ever since she had her upper respiratory infection and has been coughing, she has developed pain in the mid back on the left side just below the scapula that is made worse by coughing twisting turning and bending.  09/12/17 Patient is here today asking if I have any other options to treat her chronic pain.  She reports burning stinging aching pain in both legs from the soles of her feet up to her mid thighs.  It is equal and symmetric bilaterally.  It is constant and severe.  It keeps her from sleeping at night.  She tries to limit the oxycodone is much as possible.  Occasionally she only takes the medication once or twice a day and a half a tablet at a time.  However this is providing her little if any pain relief.  Gabapentin provided her no relief.  She was unable to tolerate Lyrica and/or Cymbalta.  He has never tried nortriptyline.  She has never tried Topamax.  She also continues to complain of pain and a trigger finger in her left hand at the third MCP joint.  However there also appears to be a contracture developing in the flexor tendon proximal to the third MCP joint.  Therefore I am uncertain whether this is simply just a trigger finger or also  mild Dupuytren's contracture.  At that time, my plan was: I spent more than 30 minutes today with the patient discussing her previous history and the medications we have tried and failed in the past.  I recommended trying nortriptyline 25 mg p.o. nightly and then reassessing in 3-4 weeks if she sees improvement.  Could consider the combination of nortriptyline with gabapentin if benefit is seen.  Meanwhile I will consult a hand surgeon regarding the trigger finger of her third MCP joint as I believe this may also be complicated by mild early Dupuytren's contracture.  Reassess in 3-4 weeks.  I did refill her oxycodone today   12/02/17 Patient is here today reporting neuropathic pain in both legs up to her mid thighs.  Please see the discussion above.  This is a chronic problem for this patient.  When I questioned her about nortriptyline, she states that the medication really helps her sleep however it is doing very little to help her pain during the morning and afternoon.  However she denies any constipation or dry mouth or dizziness.  She is still using oxycodone.  She takes 1/2-1 tablet every night and may be a half a tablet during the daytime as needed for neuropathic pain.  We have discussed multiple times weaning off the narcotic pain medication however she has been unable to do so due to the pain in her back and in her legs.  She also complains of a corn on the lateral dorsal aspect of the fifth IP joint of the right foot.  There is a 4 mm corn there.  Also in between the fourth and fifth toes, there is a thick hard keratinaceous papule that is painful that appears to be a callus versus corn.  At that time, my plan was:  I refilled the patient's Percocet today.  She uses 1-1-1/2 tablets a day.  We have discussed the risk of falls and constipation but she requires the medication for pain control.  I will increase nortriptyline to 25 mg p.o. twice daily for chronic pain/neuropathic pain.  I have been happy  with the way the medicine is helping her sleep.  Hopefully taking another tablet in the morning will help with neuropathic pain during the daytime without causing too many side effects.  I have recommended a corn removal pad be applied daily to the dorsal surface of the fifth IP joint until healed.  I recommended applying Compound W in the webspace between the fourth and fifth toes to treat the keratinaceous papule and then keep a cotton ball in between the toes to prevent rubbing and friction in the future.  07/13/18 Patient presented with a myriad of symptoms today.  Biggest concerns are pain in her right occiput that radiates down her right neck.  Patient was having similar pain in May and at that time we obtained a CT scan of the brain which revealed the following:  No acute intracranial findings. Patchy and confluent low-density in the periventricular and subcortical white matter, most likely due to chronic small vessel ischemic changes.  Pain in her occiput began after she suffered a contusion to her head.  She is also reporting pain in her right shoulder with abduction that radiates down her right arm into her hand.  Pain in her right mid back which is constant and made worse with standing.  She also reports pain in the left mid back which is made worse by standing.  She continues to report pain and neuropathy/neuropathic pain radiating down both legs from her mid thighs to her feet which is chronic.  We discussed these issues in the past.  However on her exam, she is found to have a rapid irregularly irregular heartbeat which has my concern.  She states that her heart rate has been greater than 100 every time she is checked it over the last few weeks.  She denies any chest pain shortness of breath or dyspnea on exertion.  EKG today reveals normal sinus rhythm with normal intervals and a normal axis.  There is no evidence of ischemia or cardiac arrhythmia.  However her exam certainly showed  tachycardia with an irregular rhythm.+        Past Medical History:  Diagnosis Date  . ADD (attention deficit disorder)   . Arthritis   . Asthma    occassional usage of inhalers  . Bowel obstruction ([place])   . Cancer [facility])    colon cancer, 16 yrs ago  . Cataract   . Depression   . GERD (gastroesophageal reflux disease)   . Hypertension   . Neuromuscular disorder ([facility])    neuropathy  . Osteoporosis   . Scoliosis   . Spinal stenosis    Past Surgical History:  Procedure Laterality Date  . ABDOMINAL HYSTERECTOMY  1972   partial  . COLONOSCOPY     2 yrs ago  . EYE SURGERY     cataracts bilat  . FRACTURE SURGERY     thoracic spine  . JOINT REPLACEMENT     bilat shoulders  . SIGMOID RESECTION / RECTOPEXY    . SPINAL CORD STIMULATOR INSERTION  11/18/2011   Procedure: LUMBAR SPINAL CORD STIMULATOR INSERTION;  Surgeon: Dahlia Bailiff, MD;  Location: [facility];  Service: Orthopedics;  Laterality: N/A;  Thoracic 9 laminiotomy, SPINAL CORD STIMULATOR PLACEMENT thoracic 9- Thoracic 7   Current Outpatient Medications on File Prior to Visit  Medication Sig Dispense Refill  . alendronate (FOSAMAX) 70 MG tablet TAKE 1  TABLET EVERY 7 DAYS WITH A FULL GLASS OF WATER ON AN EMPTY STOMACH 12 tablet 4  . aspirin 81 MG chewable tablet Chew 81 mg by mouth every other day.    Marland Kitchen BREO ELLIPTA 100-25 MCG/INH AEPB USE 1 INHALATION DAILY 180 each 3  . Calcium Carbonate-Vitamin D (CALCIUM + D) 600-200 MG-UNIT TABS Take 1 tablet by mouth daily.    . cetirizine (ZYRTEC) 10 MG tablet TAKE 1 TABLET DAILY 90 tablet 3  . cyanocobalamin 2000 MCG tablet Take 2,000 mcg by mouth daily.    Marland Kitchen losartan (COZAAR) 50 MG tablet TAKE 1 TABLET DAILY 90 tablet 3  . Melatonin 10 MG TABS Take 1 tablet by mouth at bedtime.    . nortriptyline (PAMELOR) 25 MG capsule Take 1 capsule (25 mg total) by mouth at bedtime. 90 capsule 3  . Omega-3 Fatty Acids (FISH OIL) 1200 MG CPDR Take 4 capsules by mouth daily.    Marland Kitchen  oxyCODONE-acetaminophen (PERCOCET) 10-325 MG tablet .5 - 1 tab po Q 6hours 90 tablet 0  . Plecanatide (TRULANCE) 3 MG TABS Take 3 mg by mouth daily.    Marland Kitchen Propylene Glycol (SYSTANE BALANCE OP) Apply 1 drop to eye as needed. For dry eyes    . ranitidine (ZANTAC) 150 MG tablet Take 1 tablet (150 mg total) by mouth 2 (two) times daily. 180 tablet 3   No current facility-administered medications on file prior to visit.    Allergies  Allergen Reactions  . Cymbalta [Duloxetine Hcl] Other (See Comments)    Makes me lethargic  . Lyrica [Pregabalin] Other (See Comments)    Makes me lethargic  . Ranitidine Diarrhea  . Celebrex [Celecoxib] Rash  . Naprosyn [Naproxen] Rash   Social History   Socioeconomic History  . Marital status: Married    Spouse name: Not on file  . Number of children: 3  . Years of education: Not on file  . Highest education level: Not on file  Occupational History  . Not on file  Social Needs  . Financial resource strain: Not on file  . Food insecurity:    Worry: Not on file    Inability: Not on file  . Transportation needs:    Medical: Not on file    Non-medical: Not on file  Tobacco Use  . Smoking status: Former Smoker    Last attempt to quit: 10/11/1977    Years since quitting: 40.7  . Smokeless tobacco: Never Used  Substance and Sexual Activity  . Alcohol use: No  . Drug use: Yes    Types: Oxycodone  . Sexual activity: Not on file  Lifestyle  . Physical activity:    Days per week: Not on file    Minutes per session: Not on file  . Stress: Not on file  Relationships  . Social connections:    Talks on phone: Not on file    Gets together: Not on file    Attends religious service: Not on file    Active member of club or organization: Not on file    Attends meetings of clubs or organizations: Not on file    Relationship status: Not on file  . Intimate partner violence:    Fear of current or ex partner: Not on file    Emotionally abused: Not on file      Physically abused: Not on file    Forced sexual activity: Not on file  Other Topics Concern  . Not on file  Social History  Narrative   Lives at home with husband.     Review of Systems  All other systems reviewed and are negative.      Objective:   Physical Exam  Constitutional: She appears well-developed and well-nourished.  Cardiovascular: Tachy, irregular at times, and normal heart sounds.   No murmur heard. Pulmonary/Chest: Effort normal and breath sounds normal. No respiratory distress. She has no wheezes. She has no rales.  Abdominal: Soft. Bowel sounds are normal.  Musculoskeletal:       Thoracic back: She exhibits decreased range of motion, tenderness, bony tenderness and pain.       Lumbar back: She exhibits decreased range of motion, tenderness, bony tenderness and pain.  Vitals reviewed.        Assessment & Plan:    Irregular heart beat - Plan: EKG 12-Lead, CBC with Differential/Platelet, COMPLETE METABOLIC PANEL WITH GFR, TSH  Neck pain - Plan: DG Cervical Spine Complete  Mid back pain - Plan: DG Thoracic Spine W/Swimmers  I was unable to capture any cardiac irregularity on her EKG today however what I auscultated sounded to be atrial fibrillation.  I will schedule the patient wear a 24-hour Holter monitor to evaluate further for possible cardiac arrhythmias.  Also if the patient is tachycardic in general, she may benefit from metoprolol.  Meanwhile the pain that she is having radiate from her occiput into her right neck down her right arm and into her mid back is concerning for cervical radiculopathy.  I will obtain a cervical spine x-ray as well as a thoracic spine x-ray to evaluate further for any abnormal degenerative disc disease in the neck or in the upper back that could be triggering her pain.  Because of the palpitations and tachycardia, I will check a CBC, CMP, TSH.  Plan will be dictated based upon the results of her lab work, Holter monitor, and x-ray  results.

## 2018-07-13 NOTE — Addendum Note (Signed)
Addended by: Shary Decamp B on: 07/13/2018 03:45 PM   Modules accepted: Orders

## 2018-07-17 ENCOUNTER — Other Ambulatory Visit: Payer: Self-pay | Admitting: Family Medicine

## 2018-07-17 LAB — IRON: Iron: 26 ug/dL — ABNORMAL LOW (ref 45–160)

## 2018-07-17 LAB — CBC WITH DIFFERENTIAL/PLATELET
Basophils Absolute: 28 {cells}/uL (ref 0–200)
Basophils Relative: 0.4 %
Eosinophils Absolute: 128 {cells}/uL (ref 15–500)
Eosinophils Relative: 1.8 %
HCT: 33 % — ABNORMAL LOW (ref 35.0–45.0)
Hemoglobin: 10.5 g/dL — ABNORMAL LOW (ref 11.7–15.5)
Lymphs Abs: 1768 {cells}/uL (ref 850–3900)
MCH: 25.5 pg — ABNORMAL LOW (ref 27.0–33.0)
MCHC: 31.8 g/dL — ABNORMAL LOW (ref 32.0–36.0)
MCV: 80.1 fL (ref 80.0–100.0)
MPV: 10 fL (ref 7.5–12.5)
Monocytes Relative: 7.8 %
Neutro Abs: 4622 {cells}/uL (ref 1500–7800)
Neutrophils Relative %: 65.1 %
Platelets: 440 Thousand/uL — ABNORMAL HIGH (ref 140–400)
RBC: 4.12 Million/uL (ref 3.80–5.10)
RDW: 15.4 % — ABNORMAL HIGH (ref 11.0–15.0)
Total Lymphocyte: 24.9 %
WBC mixed population: 554 {cells}/uL (ref 200–950)
WBC: 7.1 Thousand/uL (ref 3.8–10.8)

## 2018-07-17 LAB — COMPLETE METABOLIC PANEL WITHOUT GFR
AG Ratio: 2.1 (calc) (ref 1.0–2.5)
ALT: 21 U/L (ref 6–29)
AST: 27 U/L (ref 10–35)
Albumin: 4.4 g/dL (ref 3.6–5.1)
Alkaline phosphatase (APISO): 46 U/L (ref 33–130)
BUN: 16 mg/dL (ref 7–25)
CO2: 26 mmol/L (ref 20–32)
Calcium: 9.6 mg/dL (ref 8.6–10.4)
Chloride: 103 mmol/L (ref 98–110)
Creat: 0.88 mg/dL (ref 0.60–0.88)
GFR, Est African American: 69 mL/min/1.73m2
GFR, Est Non African American: 59 mL/min/1.73m2 — ABNORMAL LOW
Globulin: 2.1 g/dL (ref 1.9–3.7)
Glucose, Bld: 88 mg/dL (ref 65–99)
Potassium: 4.7 mmol/L (ref 3.5–5.3)
Sodium: 138 mmol/L (ref 135–146)
Total Bilirubin: 0.4 mg/dL (ref 0.2–1.2)
Total Protein: 6.5 g/dL (ref 6.1–8.1)

## 2018-07-17 LAB — TEST AUTHORIZATION

## 2018-07-17 LAB — TSH: TSH: 0.95 m[IU]/L (ref 0.40–4.50)

## 2018-07-17 LAB — VITAMIN B12

## 2018-07-17 MED ORDER — INSULIN PEN NEEDLE 31G X 6 MM MISC: 3 refills | Status: DC

## 2018-07-17 MED ORDER — TERIPARATIDE (RECOMBINANT) 600 MCG/2.4ML ~~LOC~~ SOLN: 20.0000 ug | Freq: Every day | SUBCUTANEOUS | 11 refills | Status: DC

## 2018-07-19 ENCOUNTER — Telehealth: Payer: Self-pay | Admitting: Family Medicine

## 2018-07-19 ENCOUNTER — Other Ambulatory Visit: Payer: Self-pay | Admitting: Family Medicine

## 2018-07-19 ENCOUNTER — Encounter: Payer: Self-pay | Admitting: Family Medicine

## 2018-07-19 ENCOUNTER — Ambulatory Visit: Payer: Medicare Other | Admitting: Family Medicine

## 2018-07-19 DIAGNOSIS — R079 Chest pain, unspecified: Secondary | ICD-10-CM | POA: Diagnosis not present

## 2018-07-19 DIAGNOSIS — I499 Cardiac arrhythmia, unspecified: Secondary | ICD-10-CM

## 2018-07-19 NOTE — Telephone Encounter (Signed)
Called [phone] to do PA for Forteo injection and received Approval from 06/19/18 - 07/18/2020 as this can only be used for 24 months in a person lifetime per pharmacists at [facility]. Case # - [id number]. Pt and pharm aware of approval.

## 2018-07-20 ENCOUNTER — Ambulatory Visit: Payer: Medicare Other | Admitting: Family Medicine

## 2018-07-20 DIAGNOSIS — I499 Cardiac arrhythmia, unspecified: Secondary | ICD-10-CM

## 2018-07-20 NOTE — Progress Notes (Signed)
Holter monitor applied to pt and instructions were given and pt verbalized understanding. Pt will return on 07/20/18 around 4:15 to have removed.

## 2018-07-26 ENCOUNTER — Other Ambulatory Visit: Payer: Self-pay | Admitting: Family Medicine

## 2018-07-26 ENCOUNTER — Other Ambulatory Visit: Payer: Self-pay

## 2018-07-26 DIAGNOSIS — Z85038 Personal history of other malignant neoplasm of large intestine: Secondary | ICD-10-CM | POA: Diagnosis not present

## 2018-07-26 DIAGNOSIS — E611 Iron deficiency: Secondary | ICD-10-CM | POA: Diagnosis not present

## 2018-07-27 DIAGNOSIS — S22000S Wedge compression fracture of unspecified thoracic vertebra, sequela: Secondary | ICD-10-CM

## 2018-07-27 LAB — FECAL GLOBIN BY IMMUNOCHEMISTRY
FECAL GLOBIN RESULT: NOT DETECTED
MICRO NUMBER:: 91244230
SPECIMEN QUALITY: ADEQUATE

## 2018-07-28 ENCOUNTER — Telehealth: Payer: Self-pay | Admitting: Family Medicine

## 2018-07-28 NOTE — Telephone Encounter (Signed)
Dr. Dennard Schaumann received patient's holter monitor results back and stated the following:  No dangerous arrhythmia or atrial fibrillation. She is having frequent PVC/PAC and occassional svt. No treatment is needed. Patient was notified by phone and verbalized understanding.

## 2018-07-31 ENCOUNTER — Other Ambulatory Visit: Payer: Self-pay | Admitting: Family Medicine

## 2018-07-31 MED ORDER — OXYCODONE-ACETAMINOPHEN 10-325 MG PO TABS: ORAL_TABLET | ORAL | 0 refills | Status: DC

## 2018-07-31 NOTE — Telephone Encounter (Signed)
Patient requesting a refill on Oxycodone     LOV:  07/13/18 LRF:   07/09/18

## 2018-08-07 DIAGNOSIS — M546 Pain in thoracic spine: Secondary | ICD-10-CM | POA: Diagnosis not present

## 2018-08-07 DIAGNOSIS — M810 Age-related osteoporosis without current pathological fracture: Secondary | ICD-10-CM | POA: Diagnosis not present

## 2018-08-10 ENCOUNTER — Other Ambulatory Visit: Payer: Self-pay | Admitting: Family Medicine

## 2018-08-10 MED ORDER — INSULIN PEN NEEDLE 31G X 6 MM MISC: 1.0000 | Freq: Every day | 3 refills | Status: AC

## 2018-08-10 MED ORDER — TERIPARATIDE (RECOMBINANT) 600 MCG/2.4ML ~~LOC~~ SOLN: 20.0000 ug | Freq: Every day | SUBCUTANEOUS | 3 refills | Status: DC

## 2018-08-18 ENCOUNTER — Other Ambulatory Visit: Payer: Self-pay | Admitting: Family Medicine

## 2018-08-24 ENCOUNTER — Ambulatory Visit (INDEPENDENT_AMBULATORY_CARE_PROVIDER_SITE_OTHER): Payer: Medicare Other | Admitting: Family Medicine

## 2018-08-24 ENCOUNTER — Encounter: Payer: Self-pay | Admitting: Family Medicine

## 2018-08-24 VITALS — BP 140/70 | HR 86 | Temp 98.1°F | Resp 16 | Ht <= 58 in | Wt 141.0 lb

## 2018-08-24 DIAGNOSIS — S22000S Wedge compression fracture of unspecified thoracic vertebra, sequela: Secondary | ICD-10-CM

## 2018-08-24 DIAGNOSIS — M5412 Radiculopathy, cervical region: Secondary | ICD-10-CM | POA: Diagnosis not present

## 2018-08-24 DIAGNOSIS — M542 Cervicalgia: Secondary | ICD-10-CM

## 2018-08-24 NOTE — Progress Notes (Signed)
Subjective:    Patient ID: Brianna Jacobs, female    DOB: [birth year], 84 y.o.   MRN: [id number]  HPI 01/05/16 Had MRI 2011 of lumbar spine:  T11-T12: Retropulsion produces mild central stenosis. Flattening the ventral aspect of the thoracic cord. T12-L1: Desiccated disc with broad-based posterior protrusion. Mild bilateral facet hypertrophy and ligamentum flavum redundancy without significant stenosis. L1-L2: Disc desiccation with broad-based posterior disc protrusion. Mild central stenosis. Mild bilateral facet hypertrophy. Foramina and lateral recesses patent. L2-L3: Severely degenerated and desiccated disc with degenerative endplate changes. Right greater than left facet hypertrophy. Central canal and lateral recesses patent. Moderate right foraminal stenosis associated with scoliosis and osteophytes. Right lateral disc protrusion and osteophyte could affect the exiting right L2 nerve root. Lateral protrusion and osteophyte are new compared to prior exam. L3-L4: Degenerated disc. Left eccentric broad-based posterior disc protrusion extending into the left neural foramen. Bilateral facet hypertrophy with moderate central stenosis. Left lateral recess stenosis. Moderate right foraminal stenosis with mild left foraminal stenosis. L4-L5: Severely degenerated and desiccated disc with endplate changes. Severe bilateral facet arthrosis and ligamentum flavum redundancy. Left foraminal disc protrusion and severe left foraminal stenosis with compression of the exiting left L4 nerve. Left greater than right bilateral subarticular lateral recess stenosis. Minimal right foraminal stenosis. Mild to moderate central stenosis. L5-S1: Grade 1 anterolisthesis with uncoverage of the disc and a broad-based protrusion. Bilateral lateral recess stenosis. Severe bilateral facet arthrosis with bilateral facet effusions. Mild central stenosis. Severe bilateral  foraminal stenosis with compression of both L5 nerve roots.  Patient has a long-standing history of lower back problems as evidenced in the MRI report from 2011 listed above. She also has a history of 2 separate vertebral fractures in the thoracic spine show normal x-ray of the thoracic spine in 2013. However this weekend, the patient stumbled and fell in a restaurant. Shortly thereafter, she developed severe sharp stabbing pain up and down the thoracic spine. The pain is primarily located in the paraspinal areas on either side of thoracic spine. She denies any new numbness or weakness in the right leg. However there is some new numbness and tingling located on the lateral aspect of the left thigh. She has chronic weakness in both legs and chronic neuropathy in both legs. There is no tenderness to palpation over the spinous processes of the thoracic spine or in the thoracic spine paraspinal muscles however she states the pain is most intense it has been in years.  At that time, my plan was: Differential diagnosis includes herniated disc in the thoracic spine after a fall versus vertebral fracture in the thoracic spine after a fall. I like the patient to go immediately for an x-ray of her thoracic spine. If there is a vertebral fracture, we can discuss possible kyphoplasty versus pain management. However if there is no evidence of a new vertebral fracture, I will treat the patient empirically as possibly having a herniated disc with a prednisone taper pack. I will await the results of the x-ray of the thoracic spine.  01/12/16 Patient is here today for follow-up. The pain in the center of her back is intensifying. It is 9 on a scale of 10. She is unable to walk without a walker. She has fallen 4 separate times due to the pain in her back and the worsening weakness in both legs. She also reports worsening numbness in both legs. The pain is primarily between the levels of T7 and T12. She is able  to stand from a  seated position with assistance. However she screams in pain when she does this. X-rays of the thoracic spine reveal degenerative disc disease, levoscoliosis, and stable old compression fractures with no acute changes. Prednisone taper pack was tried without relief.  At that time, my plan was: I'm concerned the patient may have spinal stenosis developing in the thoracic spine. She is unable to have an MRI of the thoracic spine due to the presence of a spinal cord stimulator. Therefore I'll schedule the patient for CT of the thoracic spine to characterize further. Meanwhile she can use fentanyl 25 g per hour every 72 hours and use oxycodone for breakthrough pain.  02/20/16 CT revealed: 1. New, from 2015, T7 compression fracture with minimal height loss and no retropulsion. An acute fracture line is not seen but given the history and subtle perispinal edema favor subacute timing. 2. Remote T9, T10, T12, and L1 compression fractures as described.  Patient underwent T7 kyphoplasty and vertebroplasty 4/25.  I also recommended fosamax.  Patient states the pain is worse. She now screams in pain whenever she moves to get out of a chair. She denies any paralysis in her legs. She does state that the numbness in her legs is getting worse. Weakness in her legs is not getting worse. She denies any bowel or bladder incontinence. She denies any saddle anesthesia. She denies any shortness of breath although she states it hurts to take a deep breath in. She also complains of pain and difficulty and dysphasia/trouble swallowing. She blames this on the addition of Fosamax.  At that time, my plan was: Very complicated situation. I explained to the patient I will focus on the pain first try to manage and control her pain and then see how many of her residual symptoms including dysphagia and trouble breathing persist. Increase fentanyl from 25 g per hour to 75 g per hour. Continue use oxycodone 10/325 one every 6 hours as  needed. Add calcitonin nasal spray daily for her acute thoracic compression fracture. Recheck in one week. If her pain is better and she continues to have dysphasia discontinue Fosamax and begin GI workup. I would replace Fosamax with prolia. I see no evidence of an infection, cauda equina syndrome, or pulmonary embolism  03/18/16 Patient presents today complaining of severe pain in her back. She does think the fentanyl is helping. She is using Percocet 3-4 times a day. She uses a half a tablet to a whole tablet each time. She never started taking calcitonin due to concern regarding the side effects. She also reports worsening neuropathy in her right leg. Burning and stinging pain will wake her up at night radiating from her right thigh down to her right foot. The weakness in her legs is not getting worse. She denies any symptoms of cauda equina syndrome.  At that  Time, my plan was: Continue fentanyl 75 g every 72 hours. She will continue to use Percocet 10/325 1/2-1 tablet every 6 hours as needed for pain. She has tried and failed gabapentin, Lyrica, Cymbalta, and amitriptyline for neuropathic pain. The only other option would know to try would be trokendi off label.  At the present time she is not interested in trying this. We will discontinue calcitonin as she is afraid of the side effects. The dysphagia is not getting worse and therefore we will discontinue the Carafate. Since the dysphagia is not getting worse, I will continue to have her on Fosamax given the multiple vertebral fractures  she is having.  04/15/16 Patient is here today complaining of severe mid back pain all throughout her thoracic spine. Patient reports severe sharp pain with rotation of her trunk, with flexion, with extension of her spine. She has a difficult time getting up from a seated position. She has a difficult time sitting down. She has difficult time riding in a car. Any movement causes her to scream in pain. Fentanyl patches and  Percocet every 6 hours is not providing her significant relief. She never started the calcitonin nasal spray.  AT that time, my plan was: Given her advanced age, I am hesitant to increase her narcotics higher due to potential side effects.  I will rule out other potential causes of back pain by obtaining an SPEP and UPEP although I believe her pain is most likely due to the compression fractures in her thoracic spine coupled with her lumbar degenerative disc disease and mild to moderate spinal stenosis.  Therefore I will consult the pain clinic to better try to manage her pain as I have been unsuccessful.  05/25/16 Patient has yet to see the pain clinic. Fortunately however it seems like her pain is improving. She is still not using calcitonin. She is here today because she would like to go over all the medication she is taking. I reviewed with her the med list in detail. I explained to her the calcitonin is simply for bone pain. She can certainly stop that and discard the medication if she does not feel it's beneficial. I explained to the patient that Carafate is intended to treat ulcers and gastritis. At the present time she denies any symptoms of these. She is already on pantoprazole as well as Zantac which seemed to control her reflux. However she still complains of dysphasia. She also complains of food sticking in her upper esophagus at times. I recommended that she follow-up with her gastroenterologist as she may need an EGD and dilatation which she has had in the past.  Her blood pressure today is elevated. She is on losartan 50 mg by mouth daily she states that this works well but she has not been checking her blood pressure at home.  AT that time, my plan was: Patient is scheduling herself to meet with a neurosurgeon to discuss other options for pain control regarding her mid back pain. Fortunately this is improving. I recommended she discontinue calcitonin as she has been sporadic and using the  medication. Also recommended that she discontinue Carafate as her symptoms now do not suggest gastritis or an ulcer. However she is having dysphasia so I recommended that she call her gastroenterologist and arrange a follow-up to discuss possible repeat EGD. Her blood pressure today is elevated. I recommended that she check her blood pressure frequently over the next week and then provide the values to me to review. If consistently greater than 140/90, I would increase her losartan to 100 mg a day  09/20/16 Patient is here today to discuss her back pain. She's been doing very well on the fentanyl 75 g every 72 hours. She is usually only requiring one Percocet at night to help her sleep. However I will see I am very concerned about risk and an 84 year old female continuing fentanyl long-term. She is here today to discuss this further. She continues to have pain in her thoracic spine and her lumbar spine on a daily basis. She continues to have neuropathic pain in both legs. There is no evidence of cauda equina syndrome but  rather only persistent pain. At the present time the pain is not interfering with any activity of daily living.  Atthat time, my plan was: We discussed the risk of narcotic overdose given her advanced age on high-dose fentanyl. We will wean the patient down to 25 g every 72 hours to avoid withdrawal given the fact the patient is been on this medication from a 7 months. She can continue to use the Percocet for breakthrough pain as needed. In one month, if the patient is doing well we will discontinue fentanyl and replaced with Percocet 10/325 po q 6 hrs prn (120/month).  02/24/17 Patient has a history of chronic back pain is well outlined above. She reports pain in her neck mid back, and lower back. It is exacerbated by everyday movement. She also has peripheral neuropathy in her legs. She is successfully discontinued fentanyl and still takes Percocet but does so rarely only for breakthrough  pain. She is here today to discuss other options for treatment of her pain. She believes she may have fibromyalgia. I disagree. I believe there is a biomechanical mechanism of her chronic back pain beyond that explained by fibromyalgia. She is interested in seeing a chiropractor for "lasera" treatments. Essentially this uses a laser to "cause vasodilation to improve local blood flow and local oxygen levels to improve chronic pain". At that time, my plan was: My heart goes out for this patient. She is in chronic pain. Medicine has very little to offer her. She is tried a spinal cord stimulator with no benefit. She's tried epidural steroid injections with no benefit. She is a poor surgical candidate given her severe osteoporosis. She is currently taking regular narcotics to manage her back pain. I explained to the patient that I do not believe that the [place] therapy is FDA approved. Therefore I'm not an advocate for this. However the patient is very hopeful because she's had a friend who has tried it and is seeing significant benefit. I see no medical contraindications to her receiving this treatment and therefore I'm fine with her trying it as long as she realizes that it has not been rigorously studied by the FDA. She'll receive this treatment through a local chiropractor's office.   08/05/17 Patient is a difficult historian.  She is extremely sweet 84 year old white female here with multiple concerns.  I tried very hard to agenda set with her to focus her issues.  Her biggest concerns are (1) 2 weeks of rhinorrhea, postnasal drip, and cough productive of yellow sputum.(2) a trigger finger in her left third MCP joint that has been present for 2 weeks, (3) pain in her mid back on the left side that has been present for 2 weeks and is exacerbated by coughing.  After exhausting her concerns, I then proceeded to perform history and physical on each of these 3 issues.  At which point she also asked about pain in  her neck, her left arm, and peripheral neuropathy.  At that point I had to redirect the patient back to her initial 3 complaints because we did not have sufficient time to tackle all of her concerns.  Regarding problem #1, she has had an upper respiratory infection for 2 weeks.  Symptoms include rhinorrhea, postnasal drip, scratchy throat, chest congestion, and cough productive of yellow sputum.  She denies any chest pain or shortness of breath or fever.  She denies any sinus pain.  She is taking Mucinex and Robitussin and cetirizine.  Regarding problem #2, whenever she  flexes her left third MCP joint and makes a fist, she is unable to extend her PIP joint without forcibly extending it.  She has pain on the palmar surface of the MCP joint.  Regarding problem #3, ever since she had her upper respiratory infection and has been coughing, she has developed pain in the mid back on the left side just below the scapula that is made worse by coughing twisting turning and bending.  09/12/17 Patient is here today asking if I have any other options to treat her chronic pain.  She reports burning stinging aching pain in both legs from the soles of her feet up to her mid thighs.  It is equal and symmetric bilaterally.  It is constant and severe.  It keeps her from sleeping at night.  She tries to limit the oxycodone is much as possible.  Occasionally she only takes the medication once or twice a day and a half a tablet at a time.  However this is providing her little if any pain relief.  Gabapentin provided her no relief.  She was unable to tolerate Lyrica and/or Cymbalta.  He has never tried nortriptyline.  She has never tried Topamax.  She also continues to complain of pain and a trigger finger in her left hand at the third MCP joint.  However there also appears to be a contracture developing in the flexor tendon proximal to the third MCP joint.  Therefore I am uncertain whether this is simply just a trigger finger or also  mild Dupuytren's contracture.  At that time, my plan was: I spent more than 30 minutes today with the patient discussing her previous history and the medications we have tried and failed in the past.  I recommended trying nortriptyline 25 mg p.o. nightly and then reassessing in 3-4 weeks if she sees improvement.  Could consider the combination of nortriptyline with gabapentin if benefit is seen.  Meanwhile I will consult a hand surgeon regarding the trigger finger of her third MCP joint as I believe this may also be complicated by mild early Dupuytren's contracture.  Reassess in 3-4 weeks.  I did refill her oxycodone today   12/02/17 Patient is here today reporting neuropathic pain in both legs up to her mid thighs.  Please see the discussion above.  This is a chronic problem for this patient.  When I questioned her about nortriptyline, she states that the medication really helps her sleep however it is doing very little to help her pain during the morning and afternoon.  However she denies any constipation or dry mouth or dizziness.  She is still using oxycodone.  She takes 1/2-1 tablet every night and may be a half a tablet during the daytime as needed for neuropathic pain.  We have discussed multiple times weaning off the narcotic pain medication however she has been unable to do so due to the pain in her back and in her legs.  She also complains of a corn on the lateral dorsal aspect of the fifth IP joint of the right foot.  There is a 4 mm corn there.  Also in between the fourth and fifth toes, there is a thick hard keratinaceous papule that is painful that appears to be a callus versus corn.  At that time, my plan was:  I refilled the patient's Percocet today.  She uses 1-1-1/2 tablets a day.  We have discussed the risk of falls and constipation but she requires the medication for pain control.  I will increase nortriptyline to 25 mg p.o. twice daily for chronic pain/neuropathic pain.  I have been happy  with the way the medicine is helping her sleep.  Hopefully taking another tablet in the morning will help with neuropathic pain during the daytime without causing too many side effects.  I have recommended a corn removal pad be applied daily to the dorsal surface of the fifth IP joint until healed.  I recommended applying Compound W in the webspace between the fourth and fifth toes to treat the keratinaceous papule and then keep a cotton ball in between the toes to prevent rubbing and friction in the future.  07/13/18 Patient presented with a myriad of symptoms today.  Biggest concerns are pain in her right occiput that radiates down her right neck.  Patient was having similar pain in May and at that time we obtained a CT scan of the brain which revealed the following:  No acute intracranial findings. Patchy and confluent low-density in the periventricular and subcortical white matter, most likely due to chronic small vessel ischemic changes.  Pain in her occiput began after she suffered a contusion to her head.  She is also reporting pain in her right shoulder with abduction that radiates down her right arm into her hand.  Pain in her right mid back which is constant and made worse with standing.  She also reports pain in the left mid back which is made worse by standing.  She continues to report pain and neuropathy/neuropathic pain radiating down both legs from her mid thighs to her feet which is chronic.  We discussed these issues in the past.  However on her exam, she is found to have a rapid irregularly irregular heartbeat which has my concern.  She states that her heart rate has been greater than 100 every time she is checked it over the last few weeks.  She denies any chest pain shortness of breath or dyspnea on exertion.  EKG today reveals normal sinus rhythm with normal intervals and a normal axis.  There is no evidence of ischemia or cardiac arrhythmia.  However her exam certainly showed  tachycardia with an irregular rhythm.  At that time, my plan was: I was unable to capture any cardiac irregularity on her EKG today however what I auscultated sounded to be atrial fibrillation.  I will schedule the patient wear a 24-hour Holter monitor to evaluate further for possible cardiac arrhythmias.  Also if the patient is tachycardic in general, she may benefit from metoprolol.  Meanwhile the pain that she is having radiate from her occiput into her right neck down her right arm and into her mid back is concerning for cervical radiculopathy.  I will obtain a cervical spine x-ray as well as a thoracic spine x-ray to evaluate further for any abnormal degenerative disc disease in the neck or in the upper back that could be triggering her pain.  Because of the palpitations and tachycardia, I will check a CBC, CMP, TSH.  Plan will be dictated based upon the results of her lab work, Holter monitor, and x-ray results.  08/24/18 C-spine IMPRESSION: 1. No acute osseous abnormality identified in the cervical spine. 2. Widespread severe cervical spine degeneration.  T-spine IMPRESSION: 1. T6 and T8 compression fractures are new since 2017, age indeterminate. If specific therapy such as vertebroplasty is desired, Nuclear Medicine Whole-body Bone Scan or thoracic spine MRI would best determine acuity. 2. Chronic T7, T9, T10, T12, and L1 compression fractures -  many previously augmented. 3.  Aortic Atherosclerosis (ICD10-I70.0).  Due to the severity of her osteoporosis and the numerous compression fractures, the patient is currently on Forteo.  However she is here today to discuss the pain in the right side of her neck.  This radiates into her right shoulder.  It occurs more frequently she turns her head from side to side.  She denies any numbness or tingling in her right hand.  She denies any weakness in her right hand however the pain shoots from her neck into her right shoulder.  The pain can be  intense and severe.  However at the present time she is pain-free.  She is currently taking oxycodone, 1 whole tablet at night and 1/2 tablet in the afternoon.  However she is having breakthrough pain usually early in the morning.   Past Medical History:  Diagnosis Date  . ADD (attention deficit disorder)   . Arthritis   . Asthma    occassional usage of inhalers  . Bowel obstruction ([place])   . Cancer [facility])    colon cancer, 16 yrs ago  . Cataract   . Depression   . GERD (gastroesophageal reflux disease)   . Hypertension   . Neuromuscular disorder ([facility])    neuropathy  . Osteoporosis   . Scoliosis   . Spinal stenosis    Past Surgical History:  Procedure Laterality Date  . ABDOMINAL HYSTERECTOMY  1972   partial  . COLONOSCOPY     2 yrs ago  . EYE SURGERY     cataracts bilat  . FRACTURE SURGERY     thoracic spine  . JOINT REPLACEMENT     bilat shoulders  . SIGMOID RESECTION / RECTOPEXY    . SPINAL CORD STIMULATOR INSERTION  11/18/2011   Procedure: LUMBAR SPINAL CORD STIMULATOR INSERTION;  Surgeon: Dahlia Bailiff, MD;  Location: [facility];  Service: Orthopedics;  Laterality: N/A;  Thoracic 9 laminiotomy, SPINAL CORD STIMULATOR PLACEMENT thoracic 9- Thoracic 7   Current Outpatient Medications on File Prior to Visit  Medication Sig Dispense Refill  . aspirin 81 MG chewable tablet Chew 81 mg by mouth every other day.    Marland Kitchen BREO ELLIPTA 100-25 MCG/INH AEPB USE 1 INHALATION DAILY 180 each 4  . Calcium Carbonate-Vitamin D (CALCIUM + D) 600-200 MG-UNIT TABS Take 1 tablet by mouth daily.    . cetirizine (ZYRTEC) 10 MG tablet TAKE 1 TABLET DAILY 90 tablet 3  . cyanocobalamin 2000 MCG tablet Take 2,000 mcg by mouth daily.    . Insulin Pen Needle (UNIFINE PENTIPS) 31G X 6 MM MISC 1 Syringe by Does not apply route daily. 90 each 3  . losartan (COZAAR) 50 MG tablet TAKE 1 TABLET DAILY 90 tablet 3  . Melatonin 10 MG TABS Take 1 tablet by mouth at bedtime.    . nortriptyline (PAMELOR) 25 MG  capsule Take 1 capsule (25 mg total) by mouth at bedtime. 90 capsule 3  . Omega-3 Fatty Acids (FISH OIL) 1200 MG CPDR Take 4 capsules by mouth daily.    Marland Kitchen oxyCODONE-acetaminophen (PERCOCET) 10-325 MG tablet .5 - 1 tab po Q 6hours 90 tablet 0  . Plecanatide (TRULANCE) 3 MG TABS Take 3 mg by mouth daily.    Marland Kitchen Propylene Glycol (SYSTANE BALANCE OP) Apply 1 drop to eye as needed. For dry eyes    . ranitidine (ZANTAC) 150 MG tablet Take 1 tablet (150 mg total) by mouth 2 (two) times daily. 180 tablet 3  . Teriparatide,  Recombinant, (FORTEO) 600 MCG/2.4ML SOLN Inject 0.08 mLs (20 mcg total) into the skin daily. 6.72 mL 3   No current facility-administered medications on file prior to visit.    Allergies  Allergen Reactions  . Cymbalta [Duloxetine Hcl] Other (See Comments)    Makes me lethargic  . Lyrica [Pregabalin] Other (See Comments)    Makes me lethargic  . Ranitidine Diarrhea  . Celebrex [Celecoxib] Rash  . Naprosyn [Naproxen] Rash   Social History   Socioeconomic History  . Marital status: Married    Spouse name: Not on file  . Number of children: 3  . Years of education: Not on file  . Highest education level: Not on file  Occupational History  . Not on file  Social Needs  . Financial resource strain: Not on file  . Food insecurity:    Worry: Not on file    Inability: Not on file  . Transportation needs:    Medical: Not on file    Non-medical: Not on file  Tobacco Use  . Smoking status: Former Smoker    Last attempt to quit: 10/11/1977    Years since quitting: 40.8  . Smokeless tobacco: Never Used  Substance and Sexual Activity  . Alcohol use: No  . Drug use: Yes    Types: Oxycodone  . Sexual activity: Not on file  Lifestyle  . Physical activity:    Days per week: Not on file    Minutes per session: Not on file  . Stress: Not on file  Relationships  . Social connections:    Talks on phone: Not on file    Gets together: Not on file    Attends religious service:  Not on file    Active member of club or organization: Not on file    Attends meetings of clubs or organizations: Not on file    Relationship status: Not on file  . Intimate partner violence:    Fear of current or ex partner: Not on file    Emotionally abused: Not on file    Physically abused: Not on file    Forced sexual activity: Not on file  Other Topics Concern  . Not on file  Social History Narrative   Lives at home with husband.     Review of Systems  All other systems reviewed and are negative.      Objective:   Physical Exam  Constitutional: She appears well-developed and well-nourished.  Cardiovascular: Tachy, irregular at times, and normal heart sounds.   No murmur heard. Pulmonary/Chest: Effort normal and breath sounds normal. No respiratory distress. She has no wheezes. She has no rales.  Abdominal: Soft. Bowel sounds are normal.  Musculoskeletal:       Thoracic back: She exhibits decreased range of motion, tenderness, bony tenderness and pain.       Lumbar back: She exhibits decreased range of motion, tenderness, bony tenderness and pain.  Vitals reviewed.  Pain with rotation of the cervical spine.  No pain with abduction in the right shoulder.  Negative empty can sign.  Negative Hawkins sign.  Positive Spurling sign      Assessment & Plan:   Cervical radiculopathy, osteoporosis, vertebral fractures  Continue Forteo.  Patient's options are limited given her age, medical comorbidities, and severe osteoporosis.  I recommended that she increase the frequency of oxycodone to 1 tablet p.o. 3 times daily to help control the pain as she is a poor surgical candidate and I doubt that she would  benefit from cortisone injections in the cervical spine.  If the pain worsens, I will proceed with an MRI of the cervical spine to determine if she would benefit from epidural steroid injections or facet injections however the degenerative disc disease appears to be widespread on her  x-ray but I doubt it would be amenable to a isolated epidural steroid injection

## 2018-09-24 ENCOUNTER — Other Ambulatory Visit: Payer: Self-pay | Admitting: Family Medicine

## 2018-09-25 DIAGNOSIS — Z1231 Encounter for screening mammogram for malignant neoplasm of breast: Secondary | ICD-10-CM | POA: Diagnosis not present

## 2018-09-25 DIAGNOSIS — Z803 Family history of malignant neoplasm of breast: Secondary | ICD-10-CM | POA: Diagnosis not present

## 2018-09-25 LAB — HM MAMMOGRAPHY

## 2018-10-12 ENCOUNTER — Encounter: Payer: Self-pay | Admitting: *Deleted

## 2018-10-12 ENCOUNTER — Encounter: Payer: Self-pay | Admitting: Family Medicine

## 2018-10-12 MED ORDER — OXYCODONE-ACETAMINOPHEN 10-325 MG PO TABS: ORAL_TABLET | ORAL | 0 refills | Status: DC

## 2018-10-12 NOTE — Telephone Encounter (Signed)
Patient requesting a refill on Oxycodone     LOV: 08/24/18  LRF:     07/31/18

## 2018-10-23 ENCOUNTER — Encounter: Payer: Self-pay | Admitting: Family Medicine

## 2018-10-23 ENCOUNTER — Ambulatory Visit (INDEPENDENT_AMBULATORY_CARE_PROVIDER_SITE_OTHER): Payer: Medicare Other | Admitting: Family Medicine

## 2018-10-23 VITALS — BP 168/88 | HR 86 | Temp 98.2°F | Resp 16 | Ht <= 58 in | Wt 141.0 lb

## 2018-10-23 DIAGNOSIS — I4891 Unspecified atrial fibrillation: Secondary | ICD-10-CM

## 2018-10-23 DIAGNOSIS — I499 Cardiac arrhythmia, unspecified: Secondary | ICD-10-CM

## 2018-10-23 MED ORDER — METOPROLOL SUCCINATE ER 25 MG PO TB24: 25.0000 mg | ORAL_TABLET | Freq: Every day | ORAL | 3 refills | Status: DC

## 2018-10-23 NOTE — Progress Notes (Signed)
Subjective:    Patient ID: Brianna Jacobs, female    DOB: 1930/04/23, 85 y.o.   MRN: [id number]  HPI Patient presents today to discuss a multitude of issues including right occipital headache, right-sided neck pain, trouble swallowing, worsening neuropathy, mid back pain.  These issues are similar to what has previously been discussed.  However on her exam, she is found to be in an irregularly irregular heart rhythm.  She has been reporting heart rates at home greater than 100.  EKG today shows significant change from previous EKG including atrial fibrillation.  There are new Q waves in leads V3 and V4.  There is also a prolonged QRS interval that was not previously present.  This is significantly different than her previous EKG.  Therefore the remainder of her visit was spent discussing these findings. Past Medical History:  Diagnosis Date  . ADD (attention deficit disorder)   . Arthritis   . Asthma    occassional usage of inhalers  . Bowel obstruction ([place])   . Cancer [facility])    colon cancer, 16 yrs ago  . Cataract   . Depression   . GERD (gastroesophageal reflux disease)   . Hypertension   . Neuromuscular disorder ([facility])    neuropathy  . Osteoporosis   . Scoliosis   . Spinal stenosis    Past Surgical History:  Procedure Laterality Date  . ABDOMINAL HYSTERECTOMY  1972   partial  . COLONOSCOPY     2 yrs ago  . EYE SURGERY     cataracts bilat  . FRACTURE SURGERY     thoracic spine  . JOINT REPLACEMENT     bilat shoulders  . SIGMOID RESECTION / RECTOPEXY    . SPINAL CORD STIMULATOR INSERTION  11/18/2011   Procedure: LUMBAR SPINAL CORD STIMULATOR INSERTION;  Surgeon: Dahlia Bailiff, MD;  Location: [facility];  Service: Orthopedics;  Laterality: N/A;  Thoracic 9 laminiotomy, SPINAL CORD STIMULATOR PLACEMENT thoracic 9- Thoracic 7   Current Outpatient Medications on File Prior to Visit  Medication Sig Dispense Refill  . aspirin EC 81 MG tablet Take 81 mg by mouth daily.    Marland Kitchen BREO  ELLIPTA 100-25 MCG/INH AEPB USE 1 INHALATION DAILY 180 each 4  . Calcium Carbonate-Vitamin D (CALCIUM + D) 600-200 MG-UNIT TABS Take 1 tablet by mouth daily.    . cetirizine (ZYRTEC) 10 MG tablet TAKE 1 TABLET DAILY 90 tablet 3  . cyanocobalamin 2000 MCG tablet Take 2,000 mcg by mouth daily.    . Insulin Pen Needle (UNIFINE PENTIPS) 31G X 6 MM MISC 1 Syringe by Does not apply route daily. 90 each 3  . losartan (COZAAR) 50 MG tablet TAKE 1 TABLET DAILY 90 tablet 3  . Melatonin 10 MG TABS Take 1 tablet by mouth at bedtime.    . nortriptyline (PAMELOR) 25 MG capsule TAKE 1 CAPSULE AT BEDTIME 90 capsule 4  . Omega-3 Fatty Acids (FISH OIL) 1200 MG CPDR Take 4 capsules by mouth daily.    Marland Kitchen oxyCODONE-acetaminophen (PERCOCET) 10-325 MG tablet .5 - 1 tab po Q 6hours 90 tablet 0  . Plecanatide (TRULANCE) 3 MG TABS Take 3 mg by mouth daily.    Marland Kitchen Propylene Glycol (SYSTANE BALANCE OP) Apply 1 drop to eye as needed. For dry eyes    . Teriparatide, Recombinant, (FORTEO) 600 MCG/2.4ML SOLN Inject 0.08 mLs (20 mcg total) into the skin daily. 6.72 mL 3   No current facility-administered medications on file prior to visit.  Allergies  Allergen Reactions  . Cymbalta [Duloxetine Hcl] Other (See Comments)    Makes me lethargic  . Lyrica [Pregabalin] Other (See Comments)    Makes me lethargic  . Ranitidine Diarrhea  . Celebrex [Celecoxib] Rash  . Naprosyn [Naproxen] Rash   Social History   Socioeconomic History  . Marital status: Married    Spouse name: Not on file  . Number of children: 3  . Years of education: Not on file  . Highest education level: Not on file  Occupational History  . Not on file  Social Needs  . Financial resource strain: Not on file  . Food insecurity:    Worry: Not on file    Inability: Not on file  . Transportation needs:    Medical: Not on file    Non-medical: Not on file  Tobacco Use  . Smoking status: Former Smoker    Last attempt to quit: 10/11/1977    Years since  quitting: 41.0  . Smokeless tobacco: Never Used  Substance and Sexual Activity  . Alcohol use: No  . Drug use: Yes    Types: Oxycodone  . Sexual activity: Not on file  Lifestyle  . Physical activity:    Days per week: Not on file    Minutes per session: Not on file  . Stress: Not on file  Relationships  . Social connections:    Talks on phone: Not on file    Gets together: Not on file    Attends religious service: Not on file    Active member of club or organization: Not on file    Attends meetings of clubs or organizations: Not on file    Relationship status: Not on file  . Intimate partner violence:    Fear of current or ex partner: Not on file    Emotionally abused: Not on file    Physically abused: Not on file    Forced sexual activity: Not on file  Other Topics Concern  . Not on file  Social History Narrative   Lives at home with husband.       Review of Systems  All other systems reviewed and are negative.      Objective:   Physical Exam Vitals signs reviewed.  Constitutional:      Appearance: Normal appearance. She is not ill-appearing, toxic-appearing or diaphoretic.  Cardiovascular:     Rate and Rhythm: Tachycardia present. Rhythm irregular.     Heart sounds: No murmur. No friction rub. No gallop.   Pulmonary:     Effort: Pulmonary effort is normal. No respiratory distress.     Breath sounds: Normal breath sounds. No stridor. No wheezing, rhonchi or rales.  Chest:     Chest wall: No tenderness.  Musculoskeletal:     Right lower leg: No edema.     Left lower leg: No edema.  Neurological:     Mental Status: She is alert.           Assessment & Plan:  New onset atrial fibrillation  Patient's TSH was just recently checked and found to be normal.  Recommended cardiology consultation given the changes on her EKG as well as for an echocardiogram.  Meanwhile discontinue aspirin.  Replace with Xarelto 20 mg a day.  Add Toprol 25 mg XL p.o. daily for  rate control and better blood pressure control.  Recheck next week

## 2018-10-27 ENCOUNTER — Encounter: Payer: Self-pay | Admitting: Family Medicine

## 2018-10-27 ENCOUNTER — Ambulatory Visit (INDEPENDENT_AMBULATORY_CARE_PROVIDER_SITE_OTHER): Payer: Medicare Other | Admitting: Family Medicine

## 2018-10-27 VITALS — BP 132/68 | HR 68 | Temp 98.2°F | Resp 16 | Ht <= 58 in | Wt 141.0 lb

## 2018-10-27 DIAGNOSIS — I480 Paroxysmal atrial fibrillation: Secondary | ICD-10-CM

## 2018-10-27 MED ORDER — RIVAROXABAN 20 MG PO TABS: 20.0000 mg | ORAL_TABLET | Freq: Every day | ORAL | 1 refills | Status: AC

## 2018-10-27 NOTE — Progress Notes (Signed)
Subjective:    Patient ID: Brianna Jacobs, female    DOB: 1930/01/03, 85 y.o.   MRN: [id number]  HPI  10/23/18 Patient presents today to discuss a multitude of issues including right occipital headache, right-sided neck pain, trouble swallowing, worsening neuropathy, mid back pain.  These issues are similar to what has previously been discussed.  However on her exam, she is found to be in an irregularly irregular heart rhythm.  She has been reporting heart rates at home greater than 100.  EKG today shows significant change from previous EKG including atrial fibrillation.  There are new Q waves in leads V3 and V4.  There is also a prolonged QRS interval that was not previously present.  This is significantly different than her previous EKG.  Therefore the remainder of her visit was spent discussing these findings.  At that time, my plan was: New onset atrial fibrillation  Patient's TSH was just recently checked and found to be normal.  Recommended cardiology consultation given the changes on her EKG as well as for an echocardiogram.  Meanwhile discontinue aspirin.  Replace with Xarelto 20 mg a day.  Add Toprol 25 mg XL p.o. daily for rate control and better blood pressure control.  Recheck next week  10/27/18 Patient is here today for recheck.  Patient has spontaneously converted back to normal sinus rhythm today.  Heart rate is regular.  She is beating 68 bpm.  Blood pressure is much better at 132/68.  She is not feeling her heart flip in her flip out of atrial fibrillation.  She denies any chest pain or shortness of breath or dyspnea on exertion.  She is taking Xarelto as prescribed Past Medical History:  Diagnosis Date  . ADD (attention deficit disorder)   . Arthritis   . Asthma    occassional usage of inhalers  . Bowel obstruction ([place])   . Cancer [facility])    colon cancer, 16 yrs ago  . Cataract   . Depression   . GERD (gastroesophageal reflux disease)   . Hypertension   . Neuromuscular  disorder ([facility])    neuropathy  . Osteoporosis   . Scoliosis   . Spinal stenosis    Past Surgical History:  Procedure Laterality Date  . ABDOMINAL HYSTERECTOMY  1972   partial  . COLONOSCOPY     2 yrs ago  . EYE SURGERY     cataracts bilat  . FRACTURE SURGERY     thoracic spine  . JOINT REPLACEMENT     bilat shoulders  . SIGMOID RESECTION / RECTOPEXY    . SPINAL CORD STIMULATOR INSERTION  11/18/2011   Procedure: LUMBAR SPINAL CORD STIMULATOR INSERTION;  Surgeon: Dahlia Bailiff, MD;  Location: [facility];  Service: Orthopedics;  Laterality: N/A;  Thoracic 9 laminiotomy, SPINAL CORD STIMULATOR PLACEMENT thoracic 9- Thoracic 7   Current Outpatient Medications on File Prior to Visit  Medication Sig Dispense Refill  . BREO ELLIPTA 100-25 MCG/INH AEPB USE 1 INHALATION DAILY 180 each 4  . Calcium Carbonate-Vitamin D (CALCIUM + D) 600-200 MG-UNIT TABS Take 1 tablet by mouth daily.    . cetirizine (ZYRTEC) 10 MG tablet TAKE 1 TABLET DAILY 90 tablet 3  . cyanocobalamin 2000 MCG tablet Take 2,000 mcg by mouth daily.    . Insulin Pen Needle (UNIFINE PENTIPS) 31G X 6 MM MISC 1 Syringe by Does not apply route daily. 90 each 3  . losartan (COZAAR) 50 MG tablet TAKE 1 TABLET DAILY 90 tablet 3  .  Melatonin 10 MG TABS Take 1 tablet by mouth at bedtime.    . metoprolol succinate (TOPROL-XL) 25 MG 24 hr tablet Take 1 tablet (25 mg total) by mouth daily. 30 tablet 3  . nortriptyline (PAMELOR) 25 MG capsule TAKE 1 CAPSULE AT BEDTIME 90 capsule 4  . Omega-3 Fatty Acids (FISH OIL) 1200 MG CPDR Take 4 capsules by mouth daily.    Marland Kitchen oxyCODONE-acetaminophen (PERCOCET) 10-325 MG tablet .5 - 1 tab po Q 6hours 90 tablet 0  . Plecanatide (TRULANCE) 3 MG TABS Take 3 mg by mouth daily.    Marland Kitchen Propylene Glycol (SYSTANE BALANCE OP) Apply 1 drop to eye as needed. For dry eyes    . Teriparatide, Recombinant, (FORTEO) 600 MCG/2.4ML SOLN Inject 0.08 mLs (20 mcg total) into the skin daily. 6.72 mL 3   No current  facility-administered medications on file prior to visit.    Allergies  Allergen Reactions  . Cymbalta [Duloxetine Hcl] Other (See Comments)    Makes me lethargic  . Lyrica [Pregabalin] Other (See Comments)    Makes me lethargic  . Ranitidine Diarrhea  . Celebrex [Celecoxib] Rash  . Naprosyn [Naproxen] Rash   Social History   Socioeconomic History  . Marital status: Married    Spouse name: Not on file  . Number of children: 3  . Years of education: Not on file  . Highest education level: Not on file  Occupational History  . Not on file  Social Needs  . Financial resource strain: Not on file  . Food insecurity:    Worry: Not on file    Inability: Not on file  . Transportation needs:    Medical: Not on file    Non-medical: Not on file  Tobacco Use  . Smoking status: Former Smoker    Last attempt to quit: 10/11/1977    Years since quitting: 41.0  . Smokeless tobacco: Never Used  Substance and Sexual Activity  . Alcohol use: No  . Drug use: Yes    Types: Oxycodone  . Sexual activity: Not on file  Lifestyle  . Physical activity:    Days per week: Not on file    Minutes per session: Not on file  . Stress: Not on file  Relationships  . Social connections:    Talks on phone: Not on file    Gets together: Not on file    Attends religious service: Not on file    Active member of club or organization: Not on file    Attends meetings of clubs or organizations: Not on file    Relationship status: Not on file  . Intimate partner violence:    Fear of current or ex partner: Not on file    Emotionally abused: Not on file    Physically abused: Not on file    Forced sexual activity: Not on file  Other Topics Concern  . Not on file  Social History Narrative   Lives at home with husband.       Review of Systems  All other systems reviewed and are negative.      Objective:   Physical Exam Vitals signs reviewed.  Constitutional:      Appearance: Normal appearance.  She is not ill-appearing, toxic-appearing or diaphoretic.  Cardiovascular:     Rate and Rhythm: Normal rate and regular rhythm.     Heart sounds: No murmur. No friction rub. No gallop.   Pulmonary:     Effort: Pulmonary effort is normal. No respiratory  distress.     Breath sounds: Normal breath sounds. No stridor. No wheezing, rhonchi or rales.  Chest:     Chest wall: No tenderness.  Musculoskeletal:     Right lower leg: No edema.     Left lower leg: No edema.  Neurological:     Mental Status: She is alert.           Assessment & Plan:  Paroxysmal atrial fibrillation.  Today the patient is in normal sinus rhythm.  However I will continue Toprol-XL 25 mg p.o. daily for rate control.  I will continue Xarelto 20 mg a day for stroke prevention.  I explained to the patient that paroxysmal atrial fibrillation carries the same risk of stroke is persistent atrial fibrillation.  She already has an appointment to see Dr. Martinique in March.  We will continue her current medications at the present time.

## 2018-11-08 ENCOUNTER — Encounter: Payer: Self-pay | Admitting: Family Medicine

## 2018-11-22 ENCOUNTER — Encounter: Payer: Self-pay | Admitting: Cardiovascular Disease

## 2018-11-22 ENCOUNTER — Ambulatory Visit (INDEPENDENT_AMBULATORY_CARE_PROVIDER_SITE_OTHER): Payer: Medicare Other | Admitting: Cardiovascular Disease

## 2018-11-22 DIAGNOSIS — I482 Chronic atrial fibrillation, unspecified: Secondary | ICD-10-CM

## 2018-11-22 DIAGNOSIS — I10 Essential (primary) hypertension: Secondary | ICD-10-CM | POA: Insufficient documentation

## 2018-11-22 NOTE — Progress Notes (Signed)
11/22/2018 Brianna Jacobs   [birth year]  [id number]  Primary Physician Brianna Jacobs Brianna Mcgee, MD Primary Cardiologist: Lorretta Harp MD FACP, [place], [place], [place]  HPI:  Brianna Jacobs is a 85 y.o. mild to moderately overweight married Caucasian female mother of 9, grandmother of 2 grandchildren referred by Dr. Dennard Jacobs for evaluation and treatment of atrial fibrillation.  She is accompanied by her husband of 45 years Brianna Jacobs.  She is retired Marine scientist.  Her risk factors include treated hypertension and family history.  Both her father and brother had myocardial infarction's.  She is never had a heart attack or stroke.  She occasionally gets atypical chest pain.  She drinks 2 cups of coffee a day.  She was recently demonstrated to have A. fib and was placed on beta-blocker and Xarelto for stroke prophylaxis.  Patient said that they are in the process of moving to [place] to be closer to their son.   Current Meds  Medication Sig  . BREO ELLIPTA 100-25 MCG/INH AEPB USE 1 INHALATION DAILY  . Calcium Carbonate-Vitamin D (CALCIUM + D) 600-200 MG-UNIT TABS Take 1 tablet by mouth daily.  . cetirizine (ZYRTEC) 10 MG tablet TAKE 1 TABLET DAILY  . cyanocobalamin 2000 MCG tablet Take 2,000 mcg by mouth daily.  . Insulin Pen Needle (UNIFINE PENTIPS) 31G X 6 MM MISC 1 Syringe by Does not apply route daily.  Marland Kitchen losartan (COZAAR) 50 MG tablet TAKE 1 TABLET DAILY  . Melatonin 10 MG TABS Take 1 tablet by mouth at bedtime.  . metoprolol succinate (TOPROL-XL) 25 MG 24 hr tablet Take 1 tablet (25 mg total) by mouth daily.  . nortriptyline (PAMELOR) 25 MG capsule TAKE 1 CAPSULE AT BEDTIME  . Omega-3 Fatty Acids (FISH OIL) 1200 MG CPDR Take 2 capsules by mouth daily.   Marland Kitchen oxyCODONE-acetaminophen (PERCOCET) 10-325 MG tablet .5 - 1 tab po Q 6hours  . Propylene Glycol (SYSTANE BALANCE OP) Apply 1 drop to eye as needed. For dry eyes  . rivaroxaban (XARELTO) 20 MG TABS tablet Take 1 tablet (20 mg total) by mouth daily with  supper.  . Teriparatide, Recombinant, (FORTEO) 600 MCG/2.4ML SOLN Inject 0.08 mLs (20 mcg total) into the skin daily.  . [DISCONTINUED] Plecanatide (TRULANCE) 3 MG TABS Take 3 mg by mouth daily.     Allergies  Allergen Reactions  . Cymbalta [Duloxetine Hcl] Other (See Comments)    Makes me lethargic  . Lyrica [Pregabalin] Other (See Comments)    Makes me lethargic  . Ranitidine Diarrhea  . Celebrex [Celecoxib] Rash  . Naprosyn [Naproxen] Rash    Social History   Socioeconomic History  . Marital status: Married    Spouse name: Not on file  . Number of children: 3  . Years of education: Not on file  . Highest education level: Not on file  Occupational History  . Not on file  Social Needs  . Financial resource strain: Not on file  . Food insecurity:    Worry: Not on file    Inability: Not on file  . Transportation needs:    Medical: Not on file    Non-medical: Not on file  Tobacco Use  . Smoking status: Former Smoker    Last attempt to quit: 10/11/1977    Years since quitting: 41.1  . Smokeless tobacco: Never Used  Substance and Sexual Activity  . Alcohol use: No  . Drug use: Yes    Types: Oxycodone  . Sexual activity: Not on  file  Lifestyle  . Physical activity:    Days per week: Not on file    Minutes per session: Not on file  . Stress: Not on file  Relationships  . Social connections:    Talks on phone: Not on file    Gets together: Not on file    Attends religious service: Not on file    Active member of club or organization: Not on file    Attends meetings of clubs or organizations: Not on file    Relationship status: Not on file  . Intimate partner violence:    Fear of current or ex partner: Not on file    Emotionally abused: Not on file    Physically abused: Not on file    Forced sexual activity: Not on file  Other Topics Concern  . Not on file  Social History Narrative   Lives at home with husband.      Review of Systems: General: negative for  chills, fever, night sweats or weight changes.  Cardiovascular: negative for chest pain, dyspnea on exertion, edema, orthopnea, palpitations, paroxysmal nocturnal dyspnea or shortness of breath Dermatological: negative for rash Respiratory: negative for cough or wheezing Urologic: negative for hematuria Abdominal: negative for nausea, vomiting, diarrhea, bright red blood per rectum, melena, or hematemesis Neurologic: negative for visual changes, syncope, or dizziness All other systems reviewed and are otherwise negative except as noted above.    Blood pressure 128/68, pulse 86, height 4\' 8"  (1.422 m), weight 144 lb (65.3 kg).  General appearance: alert and no distress Neck: no adenopathy, no carotid bruit, no JVD, supple, symmetrical, trachea midline and thyroid not enlarged, symmetric, no tenderness/mass/nodules Lungs: clear to auscultation bilaterally Heart: irregularly irregular rhythm Extremities: extremities normal, atraumatic, no cyanosis or edema Pulses: 2+ and symmetric Skin: Skin color, texture, turgor normal. No rashes or lesions Neurologic: Alert and oriented X 3, normal strength and tone. Normal symmetric reflexes. Normal coordination and gait  EKG not performed today  ASSESSMENT AND PLAN:   Atrial fibrillation, chronic History of recently recognized atrial fibrillation rate controlled on beta-blocker.  She was begun on Xarelto as well.  Essential hypertension History of essential hypertension her blood pressure measured today at 120/68.  She is on metoprolol and losartan.  Continue current meds at current dosing.      Lorretta Harp MD FACP,FACC,FAHA, [facility] 11/22/2018 1:43 PM

## 2018-11-22 NOTE — Assessment & Plan Note (Signed)
History of recently recognized atrial fibrillation rate controlled on beta-blocker.  She was begun on Xarelto as well.

## 2018-11-22 NOTE — Assessment & Plan Note (Signed)
History of essential hypertension her blood pressure measured today at 120/68.  She is on metoprolol and losartan.  Continue current meds at current dosing.

## 2018-11-22 NOTE — Patient Instructions (Signed)
Medication Instructions:  Your physician recommends that you continue on your current medications as directed. Please refer to the Current Medication list given to you today.  If you need a refill on your cardiac medications before your next appointment, please call your pharmacy.   Lab work: NONE If you have labs (blood work) drawn today and your tests are completely normal, you will receive your results only by: Marland Kitchen MyChart Message (if you have MyChart) OR . A paper copy in the mail If you have any lab test that is abnormal or we need to change your treatment, we will call you to review the results.  Testing/Procedures: Your physician has requested that you have an echocardiogram. Echocardiography is a painless test that uses sound waves to create images of your heart. It provides your doctor with information about the size and shape of your heart and how well your heart's chambers and valves are working. This procedure takes approximately one hour. There are no restrictions for this procedure.    Follow-Up: At [facility], you and your health needs are our priority.  As part of our continuing mission to provide you with exceptional heart care, we have created designated Provider Care Teams.  These Care Teams include your primary Cardiologist (physician) and Advanced Practice Providers (APPs -  Physician Assistants and Nurse Practitioners) who all work together to provide you with the care you need, when you need it. . You MAY SCHEDULE a follow up appointment AS NEEDED.  You may see Dr. Gwenlyn Found or one of the following Advanced Practice Providers on your designated Care Team:   . Kerin Ransom, [place] . Almyra Deforest, PA-C . Fabian Sharp, PA-C . Jory Sims, DNP . Rosaria Ferries, PA-C . Roby Lofts, PA-C . Sande Rives, PA-C

## 2018-11-28 ENCOUNTER — Ambulatory Visit (HOSPITAL_COMMUNITY): Payer: Medicare Other | Attending: Internal Medicine

## 2018-11-28 DIAGNOSIS — I482 Chronic atrial fibrillation, unspecified: Secondary | ICD-10-CM | POA: Diagnosis not present

## 2018-12-01 ENCOUNTER — Telehealth: Payer: Self-pay

## 2018-12-01 DIAGNOSIS — R079 Chest pain, unspecified: Secondary | ICD-10-CM

## 2018-12-01 NOTE — Telephone Encounter (Signed)
Pt aware of results. Pt states she is moving to [place] and wants to know if Dr. Gwenlyn Found has any recommendations for cardiologists in [place]. Informed pt that message would be routed to Dr. Gwenlyn Found and she would be updated if he has any recommendations

## 2018-12-04 NOTE — Telephone Encounter (Signed)
I know nobody in [place]

## 2018-12-05 DIAGNOSIS — K5904 Chronic idiopathic constipation: Secondary | ICD-10-CM | POA: Diagnosis not present

## 2018-12-05 DIAGNOSIS — D509 Iron deficiency anemia, unspecified: Secondary | ICD-10-CM | POA: Diagnosis not present

## 2018-12-05 DIAGNOSIS — Z85030 Personal history of malignant carcinoid tumor of large intestine: Secondary | ICD-10-CM | POA: Diagnosis not present

## 2018-12-05 DIAGNOSIS — K219 Gastro-esophageal reflux disease without esophagitis: Secondary | ICD-10-CM | POA: Diagnosis not present

## 2018-12-05 DIAGNOSIS — Z86010 Personal history of colonic polyps: Secondary | ICD-10-CM | POA: Diagnosis not present

## 2018-12-11 ENCOUNTER — Telehealth: Payer: Self-pay

## 2018-12-11 NOTE — Telephone Encounter (Signed)
Informed pt that Dr. Gwenlyn Found not familiar with cardiologists in [place] to be able to recommend. Pt verbalized understanding. Pt states that she has been having left-sided chest pain that radiates to her back and her side which started sometime after her last office visit. She describes the pain as 'quick shooting pains' that last for about 30 seconds and are relieved by rest. Pt questioned about frequency of occurrence but no specifics provided. Pt states she is concerned b/c of her family Hx of deceased father and brother who both passed d/t MI so she would like to make Dr. Gwenlyn Found aware. Informed pt of MI symptoms and advised to report to ED if symptoms present. Pt also c/o neuropathy to BLE. Advised pt to f/u with PCP

## 2018-12-12 NOTE — Telephone Encounter (Signed)
If she is worried about her chest pain, and willing to get a pharmacologic Myoview stress test to further evaluate prior to her moving to [place].

## 2018-12-12 NOTE — Telephone Encounter (Signed)
Pt aware of Dr. Kennon Holter response:   "If she is worried about her chest pain, and willing to get a pharmacologic Myoview stress test to further evaluate prior to her moving to [place]."   Pt willing to schedule pharm myoview stress test. Order in Epic and message routed to scheduling to set up. Letter of instructions mailed to patient

## 2018-12-13 ENCOUNTER — Encounter: Payer: Self-pay | Admitting: Cardiovascular Disease

## 2018-12-14 ENCOUNTER — Telehealth (HOSPITAL_COMMUNITY): Payer: Self-pay

## 2018-12-14 NOTE — Telephone Encounter (Signed)
Encounter complete. 

## 2018-12-19 ENCOUNTER — Ambulatory Visit (HOSPITAL_COMMUNITY)
Admission: RE | Admit: 2018-12-19 | Discharge: 2018-12-19 | Disposition: A | Discharge status: Home / Self Care | Payer: Medicare Other | Source: Ambulatory Visit | Attending: Cardiology | Admitting: Cardiology

## 2018-12-19 DIAGNOSIS — R079 Chest pain, unspecified: Secondary | ICD-10-CM | POA: Insufficient documentation

## 2018-12-19 LAB — MYOCARDIAL PERFUSION IMAGING
CHL CUP NUCLEAR SDS: 0
CHL CUP NUCLEAR SRS: 1
CHL CUP NUCLEAR SSS: 1
LV dias vol: 58 mL (ref 46–106)
LVSYSVOL: 28 mL
NUC STRESS TID: 1.19
Peak HR: 108 {beats}/min
Rest HR: 74 {beats}/min

## 2018-12-19 MED ORDER — TECHNETIUM TC 99M TETROFOSMIN IV KIT
9.6000 | PACK | Freq: Once | INTRAVENOUS | Status: DC | PRN
  Filled 2018-12-19: qty 10

## 2018-12-19 MED ORDER — TECHNETIUM TC 99M TETROFOSMIN IV KIT
31.5000 | PACK | Freq: Once | INTRAVENOUS | Status: DC | PRN
  Filled 2018-12-19: qty 32

## 2018-12-19 MED ORDER — REGADENOSON 0.4 MG/5ML IV SOLN: 0.4000 mg | Freq: Once | INTRAVENOUS | Status: DC

## 2018-12-20 ENCOUNTER — Telehealth: Payer: Self-pay

## 2018-12-20 NOTE — Telephone Encounter (Signed)
Spoke with pt and informed pt that per last OV note on 11/22/2018, pt may f/u as needed. Pt was previously concerned with CP and Dr. Gwenlyn Found okay with order for pharmacologic myoview if pt wanted. Pt states f/u appt scheduled for 3/20 with Dr. Gwenlyn Found. Informed pt that d/t results of recent myocardial perfusion study on 3/10, "Low risk nonischemic study", she can keep 3/20 appt if she feels it is necessary to do so. Pt requests appt cancelled. Advised to contact office if pt would like to be seen. Pt agreeable with this. Informed pt of MI symptoms and advised to report to ED if symptoms present. Pt also c/o neuropathy to BLE. Advised pt to f/u with PCP who may refer pt to neurologist. Pt verbalized understanding

## 2018-12-25 ENCOUNTER — Encounter

## 2018-12-25 ENCOUNTER — Ambulatory Visit: Payer: Medicare Other | Admitting: Cardiology

## 2018-12-29 ENCOUNTER — Ambulatory Visit: Payer: Medicare Other | Admitting: Cardiovascular Disease

## 2019-01-04 NOTE — Telephone Encounter (Signed)
Error~ Encounter closed.

## 2019-01-10 ENCOUNTER — Encounter: Payer: Self-pay | Admitting: Family Medicine

## 2019-01-10 NOTE — Telephone Encounter (Signed)
Patient requesting a refill on Oxycodone     LOV:  10/27/18  LRF:      10/12/18

## 2019-01-11 MED ORDER — OXYCODONE-ACETAMINOPHEN 10-325 MG PO TABS: ORAL_TABLET | ORAL | 0 refills | Status: DC

## 2019-01-15 ENCOUNTER — Other Ambulatory Visit: Payer: Self-pay | Admitting: *Deleted

## 2019-01-15 MED ORDER — METOPROLOL SUCCINATE ER 25 MG PO TB24: 25.0000 mg | ORAL_TABLET | Freq: Every day | ORAL | 3 refills | Status: DC

## 2019-01-23 ENCOUNTER — Other Ambulatory Visit: Payer: Self-pay | Admitting: Family Medicine

## 2019-01-23 MED ORDER — TERIPARATIDE 600 MCG/2.4ML ~~LOC~~ SOPN: 20.0000 ug | PEN_INJECTOR | Freq: Every day | SUBCUTANEOUS | 3 refills | Status: AC

## 2019-02-16 ENCOUNTER — Other Ambulatory Visit: Payer: Self-pay | Admitting: Family Medicine

## 2019-02-27 ENCOUNTER — Encounter: Payer: Self-pay | Admitting: Family Medicine

## 2019-02-27 ENCOUNTER — Ambulatory Visit (INDEPENDENT_AMBULATORY_CARE_PROVIDER_SITE_OTHER): Payer: Medicare Other | Admitting: Family Medicine

## 2019-02-27 ENCOUNTER — Other Ambulatory Visit: Payer: Self-pay | Admitting: Family Medicine

## 2019-02-27 ENCOUNTER — Other Ambulatory Visit: Payer: Self-pay

## 2019-02-27 VITALS — BP 132/74 | HR 60 | Temp 98.9°F | Resp 18 | Ht <= 58 in | Wt 142.0 lb

## 2019-02-27 DIAGNOSIS — S22000S Wedge compression fracture of unspecified thoracic vertebra, sequela: Secondary | ICD-10-CM | POA: Diagnosis not present

## 2019-02-27 DIAGNOSIS — R1310 Dysphagia, unspecified: Secondary | ICD-10-CM | POA: Diagnosis not present

## 2019-02-27 DIAGNOSIS — I480 Paroxysmal atrial fibrillation: Secondary | ICD-10-CM

## 2019-02-27 DIAGNOSIS — M8000XD Age-related osteoporosis with current pathological fracture, unspecified site, subsequent encounter for fracture with routine healing: Secondary | ICD-10-CM | POA: Diagnosis not present

## 2019-02-27 DIAGNOSIS — M5136 Other intervertebral disc degeneration, lumbar region: Secondary | ICD-10-CM | POA: Diagnosis not present

## 2019-02-27 DIAGNOSIS — M51369 Other intervertebral disc degeneration, lumbar region without mention of lumbar back pain or lower extremity pain: Secondary | ICD-10-CM

## 2019-02-27 DIAGNOSIS — R1319 Other dysphagia: Secondary | ICD-10-CM

## 2019-02-27 DIAGNOSIS — I4891 Unspecified atrial fibrillation: Secondary | ICD-10-CM | POA: Insufficient documentation

## 2019-02-27 NOTE — Progress Notes (Signed)
Subjective:    Patient ID: Brianna Jacobs, female    DOB: 1930-05-08, 85 y.o.   MRN: [id number]  HPI  10/23/18 Patient presents today to discuss a multitude of issues including right occipital headache, right-sided neck pain, trouble swallowing, worsening neuropathy, mid back pain.  These issues are similar to what has previously been discussed.  However on her exam, she is found to be in an irregularly irregular heart rhythm.  She has been reporting heart rates at home greater than 100.  EKG today shows significant change from previous EKG including atrial fibrillation.  There are new Q waves in leads V3 and V4.  There is also a prolonged QRS interval that was not previously present.  This is significantly different than her previous EKG.  Therefore the remainder of her visit was spent discussing these findings.  At that time, my plan was: New onset atrial fibrillation  Patient's TSH was just recently checked and found to be normal.  Recommended cardiology consultation given the changes on her EKG as well as for an echocardiogram.  Meanwhile discontinue aspirin.  Replace with Xarelto 20 mg a day.  Add Toprol 25 mg XL p.o. daily for rate control and better blood pressure control.  Recheck next week  10/27/18 Patient is here today for recheck.  Patient has spontaneously converted back to normal sinus rhythm today.  Heart rate is regular.  She is beating 68 bpm.  Blood pressure is much better at 132/68.  She is not feeling her heart flip in her flip out of atrial fibrillation.  She denies any chest pain or shortness of breath or dyspnea on exertion.  She is taking Xarelto as prescribed.  At that time, my plan was: Today the patient is in normal sinus rhythm.  However I will continue Toprol-XL 25 mg p.o. daily for rate control.  I will continue Xarelto 20 mg a day for stroke prevention.  I explained to the patient that paroxysmal atrial fibrillation carries the same risk of stroke ias persistent atrial  fibrillation.  She already has an appointment to see Dr. Martinique in March.  We will continue her current medications at the present time.  02/27/19 Patient is in atrial fibrillation today.  She does not feel it but her pulse is irregular.  She is appropriately anticoagulated and her heart rate is maintained between 80 and 85 bpm today on her exam.  However she is reporting dysphasia.  Almost on a daily basis, the patient will develop a sharp substernal pain.  It will hit her usually while swallowing food.  The pain will be intense like a cramp.  It is brought on by cold liquid or sometimes spicy food.  She denies any difficulty swallowing liquids.  In fact she can eat chicken or steak without the food sticking going down her throat.  She denies any dysphagia to pills.  The episodic nature of the pain associated with swallowing makes me suspicious about a possible esophageal spasm.  The patient had an EGD in 2015 that revealed a mild esophageal stricture as well as a Zenker's diverticulum.  Stricture was dilated.  She had a barium swallow performed in 2017 which revealed mild reflux but otherwise was normal.  She denies any weight loss.  She denies any hematemesis.  She denies any hemoptysis.  She denies any melena or hematochezia.  She denies any reflux.  Patient also continues to report pain in her back primarily in the thoracic spine.  She has severe  degenerative disc disease in the cervical spine at C4-C5 and C6-C7.  She also has several compression fractures.  On an x-ray in October 2019, the patient was found to have 6 separate thoracic compression fractures, 2 of which that were new.  She also has degenerative disc disease in the lumbar spine status post spinal cord stimulator there was ultimately ineffective controlling her low back pain and peripheral neuropathy.  We have tried and failed gabapentin, Lyrica, nortriptyline.  Ultimately, the patient is currently taking Percocet 1 tablet at night and 1/2  tablet during the day to help manage her pain.  Given her advanced age I am concerned about delirium and falls on narcotic pain medication however given her limited options and the small amount which she uses I believe this is her safest option.  She is still taking Forteo for osteoporosis given her numerous compression fractures in the thoracic spine.  She seems to be doing well on this. Past Medical History:  Diagnosis Date  . ADD (attention deficit disorder)   . Arthritis   . Asthma    occassional usage of inhalers  . Bowel obstruction ([facility])   . Cancer [facility])    colon cancer, 16 yrs ago  . Cataract   . Depression   . GERD (gastroesophageal reflux disease)   . Hypertension   . Neuromuscular disorder (HCC)    neuropathy  . Osteoporosis   . Scoliosis   . Spinal stenosis    Past Surgical History:  Procedure Laterality Date  . ABDOMINAL HYSTERECTOMY  1972   partial  . COLONOSCOPY     2 yrs ago  . EYE SURGERY     cataracts bilat  . FRACTURE SURGERY     thoracic spine  . JOINT REPLACEMENT     bilat shoulders  . SIGMOID RESECTION / RECTOPEXY    . SPINAL CORD STIMULATOR INSERTION  11/18/2011   Procedure: LUMBAR SPINAL CORD STIMULATOR INSERTION;  Surgeon: Dahlia Bailiff, MD;  Location: [facility];  Service: Orthopedics;  Laterality: N/A;  Thoracic 9 laminiotomy, SPINAL CORD STIMULATOR PLACEMENT thoracic 9- Thoracic 7   Current Outpatient Medications on File Prior to Visit  Medication Sig Dispense Refill  . BREO ELLIPTA 100-25 MCG/INH AEPB USE 1 INHALATION DAILY 180 each 4  . Calcium Carbonate-Vitamin D (CALCIUM + D) 600-200 MG-UNIT TABS Take 1 tablet by mouth daily.    . cetirizine (ZYRTEC) 10 MG tablet TAKE 1 TABLET DAILY 90 tablet 0  . cyanocobalamin 2000 MCG tablet Take 2,000 mcg by mouth daily.    . Insulin Pen Needle (UNIFINE PENTIPS) 31G X 6 MM MISC 1 Syringe by Does not apply route daily. 90 each 3  . losartan (COZAAR) 50 MG tablet TAKE 1 TABLET DAILY 90 tablet 0  . Melatonin 10  MG TABS Take 1 tablet by mouth at bedtime.    . metoprolol succinate (TOPROL-XL) 25 MG 24 hr tablet Take 1 tablet (25 mg total) by mouth daily. 90 tablet 3  . nortriptyline (PAMELOR) 25 MG capsule TAKE 1 CAPSULE AT BEDTIME 90 capsule 4  . Omega-3 Fatty Acids (FISH OIL) 1200 MG CPDR Take 2 capsules by mouth daily.     Marland Kitchen oxyCODONE-acetaminophen (PERCOCET) 10-325 MG tablet .5 - 1 tab po Q 6hours 90 tablet 0  . Propylene Glycol (SYSTANE BALANCE OP) Apply 1 drop to eye as needed. For dry eyes    . rivaroxaban (XARELTO) 20 MG TABS tablet Take 1 tablet (20 mg total) by mouth daily with supper.  90 tablet 1  . Teriparatide, Recombinant, (FORTEO) 600 MCG/2.4ML SOLN Inject 0.08 mLs (20 mcg total) into the skin daily. 6.72 mL 3  . Teriparatide, Recombinant, (FORTEO) 600 MCG/2.4ML SOPN Inject 20 mcg into the skin daily. 3 pen 3   No current facility-administered medications on file prior to visit.    Allergies  Allergen Reactions  . Cymbalta [Duloxetine Hcl] Other (See Comments)    Makes me lethargic  . Lyrica [Pregabalin] Other (See Comments)    Makes me lethargic  . Ranitidine Diarrhea  . Celebrex [Celecoxib] Rash  . Naprosyn [Naproxen] Rash   Social History   Socioeconomic History  . Marital status: Married    Spouse name: Not on file  . Number of children: 3  . Years of education: Not on file  . Highest education level: Not on file  Occupational History  . Not on file  Social Needs  . Financial resource strain: Not on file  . Food insecurity:    Worry: Not on file    Inability: Not on file  . Transportation needs:    Medical: Not on file    Non-medical: Not on file  Tobacco Use  . Smoking status: Former Smoker    Last attempt to quit: 10/11/1977    Years since quitting: 41.4  . Smokeless tobacco: Never Used  Substance and Sexual Activity  . Alcohol use: No  . Drug use: Yes    Types: Oxycodone  . Sexual activity: Not on file  Lifestyle  . Physical activity:    Days per week:  Not on file    Minutes per session: Not on file  . Stress: Not on file  Relationships  . Social connections:    Talks on phone: Not on file    Gets together: Not on file    Attends religious service: Not on file    Active member of club or organization: Not on file    Attends meetings of clubs or organizations: Not on file    Relationship status: Not on file  . Intimate partner violence:    Fear of current or ex partner: Not on file    Emotionally abused: Not on file    Physically abused: Not on file    Forced sexual activity: Not on file  Other Topics Concern  . Not on file  Social History Narrative   Lives at home with husband.       Review of Systems  All other systems reviewed and are negative.      Objective:   Physical Exam Vitals signs reviewed.  Constitutional:      Appearance: Normal appearance. She is not ill-appearing, toxic-appearing or diaphoretic.  Neck:     Musculoskeletal: Neck supple.  Cardiovascular:     Rate and Rhythm: Normal rate. Rhythm irregularly irregular.     Heart sounds: No murmur. No friction rub. No gallop.   Pulmonary:     Effort: Pulmonary effort is normal. No respiratory distress.     Breath sounds: Normal breath sounds. No stridor. No wheezing, rhonchi or rales.  Chest:     Chest wall: No tenderness.  Musculoskeletal:     Right lower leg: No edema.     Left lower leg: No edema.  Neurological:     Mental Status: She is alert.           Assessment & Plan:  Compression fx, thoracic spine, sequela  Paroxysmal atrial fibrillation (HCC)  Esophageal dysphagia  Age-related osteoporosis with current  pathological fracture with routine healing, subsequent encounter  DDD (degenerative disc disease), lumbar  Regarding her chronic back pain secondary to degenerative disc disease in the cervical and lumbar spine as well as compression fractures in the thoracic spine, I have recommended that she continue Percocet 1 to 2 tablets  daily.  We discussed the risk of falls and delirium on the medication and dizziness.  I have cautioned the patient to be careful taking the medication and to avoid constipation by taking a stool softener as needed.  Regarding her paroxysmal atrial fibrillation, her heart rate is appropriately controlled on metoprolol and she is appropriately anticoagulated on Xarelto.  We will make no changes at the present time.  Regarding her osteoporosis we will continue Forteo and I continue to encourage the patient to use calcium and vitamin D.  Regarding her dysphasia, I have recommended a barium swallow versus an EGD.  The patient is moving to [place] June 8 so given the short duration of time, I will try to schedule the patient for a barium swallow to evaluate for any evidence of an obstructive mass or stricture.  However her history sounds more consistent with esophageal spasm.  If if this is in fact the case, the patient may do well switching from metoprolol to Cardizem in an effort to try to mitigate the esophageal spasms and control her heart rate regarding her atrial fibrillation

## 2019-03-01 ENCOUNTER — Ambulatory Visit
Admission: RE | Admit: 2019-03-01 | Discharge: 2019-03-01 | Disposition: A | Discharge status: Home / Self Care | Payer: Medicare Other | Source: Ambulatory Visit | Attending: Family Medicine | Admitting: Family Medicine

## 2019-03-01 ENCOUNTER — Other Ambulatory Visit: Payer: Self-pay | Admitting: Family Medicine

## 2019-03-01 DIAGNOSIS — R1319 Other dysphagia: Secondary | ICD-10-CM

## 2019-03-01 DIAGNOSIS — R1310 Dysphagia, unspecified: Secondary | ICD-10-CM

## 2019-03-09 ENCOUNTER — Other Ambulatory Visit: Payer: Self-pay | Admitting: Family Medicine

## 2019-03-09 MED ORDER — OXYCODONE-ACETAMINOPHEN 10-325 MG PO TABS: ORAL_TABLET | ORAL | 0 refills | Status: AC

## 2019-03-09 NOTE — Telephone Encounter (Signed)
Patient requesting a refill on Oxycodone     LOV: 02/27/19  LRF:   01/11/19

## 2019-04-10 DIAGNOSIS — I4891 Unspecified atrial fibrillation: Secondary | ICD-10-CM | POA: Diagnosis not present

## 2019-04-10 DIAGNOSIS — M79671 Pain in right foot: Secondary | ICD-10-CM | POA: Diagnosis not present

## 2019-04-10 DIAGNOSIS — M419 Scoliosis, unspecified: Secondary | ICD-10-CM | POA: Diagnosis not present

## 2019-05-01 DIAGNOSIS — L84 Corns and callosities: Secondary | ICD-10-CM | POA: Diagnosis not present

## 2019-05-04 DIAGNOSIS — B351 Tinea unguium: Secondary | ICD-10-CM | POA: Diagnosis not present

## 2019-05-04 DIAGNOSIS — L84 Corns and callosities: Secondary | ICD-10-CM | POA: Diagnosis not present

## 2019-05-14 ENCOUNTER — Other Ambulatory Visit: Payer: Self-pay

## 2019-05-16 DIAGNOSIS — M810 Age-related osteoporosis without current pathological fracture: Secondary | ICD-10-CM | POA: Diagnosis not present

## 2019-05-16 DIAGNOSIS — I4891 Unspecified atrial fibrillation: Secondary | ICD-10-CM | POA: Diagnosis not present

## 2019-05-16 DIAGNOSIS — I4519 Other right bundle-branch block: Secondary | ICD-10-CM | POA: Diagnosis not present

## 2019-05-16 DIAGNOSIS — I10 Essential (primary) hypertension: Secondary | ICD-10-CM | POA: Diagnosis not present

## 2019-05-21 DIAGNOSIS — I4510 Unspecified right bundle-branch block: Secondary | ICD-10-CM | POA: Diagnosis not present

## 2019-05-21 DIAGNOSIS — I10 Essential (primary) hypertension: Secondary | ICD-10-CM | POA: Diagnosis not present

## 2019-05-21 DIAGNOSIS — I4891 Unspecified atrial fibrillation: Secondary | ICD-10-CM | POA: Diagnosis not present

## 2019-05-23 DIAGNOSIS — I10 Essential (primary) hypertension: Secondary | ICD-10-CM | POA: Diagnosis not present

## 2019-05-23 DIAGNOSIS — R1310 Dysphagia, unspecified: Secondary | ICD-10-CM | POA: Diagnosis not present

## 2019-05-23 DIAGNOSIS — I4891 Unspecified atrial fibrillation: Secondary | ICD-10-CM | POA: Diagnosis not present

## 2019-05-23 DIAGNOSIS — I4519 Other right bundle-branch block: Secondary | ICD-10-CM | POA: Diagnosis not present

## 2019-05-23 DIAGNOSIS — R079 Chest pain, unspecified: Secondary | ICD-10-CM | POA: Diagnosis not present

## 2019-05-25 DIAGNOSIS — I361 Nonrheumatic tricuspid (valve) insufficiency: Secondary | ICD-10-CM | POA: Diagnosis not present

## 2019-05-25 DIAGNOSIS — I081 Rheumatic disorders of both mitral and tricuspid valves: Secondary | ICD-10-CM | POA: Diagnosis not present

## 2019-05-25 DIAGNOSIS — I519 Heart disease, unspecified: Secondary | ICD-10-CM | POA: Diagnosis not present

## 2019-05-25 DIAGNOSIS — I340 Nonrheumatic mitral (valve) insufficiency: Secondary | ICD-10-CM | POA: Diagnosis not present

## 2019-05-25 DIAGNOSIS — R079 Chest pain, unspecified: Secondary | ICD-10-CM | POA: Diagnosis not present

## 2019-05-25 DIAGNOSIS — R9439 Abnormal result of other cardiovascular function study: Secondary | ICD-10-CM | POA: Diagnosis not present

## 2019-05-25 DIAGNOSIS — I517 Cardiomegaly: Secondary | ICD-10-CM | POA: Diagnosis not present

## 2019-06-12 DIAGNOSIS — H353131 Nonexudative age-related macular degeneration, bilateral, early dry stage: Secondary | ICD-10-CM | POA: Diagnosis not present

## 2019-06-12 DIAGNOSIS — H524 Presbyopia: Secondary | ICD-10-CM | POA: Diagnosis not present

## 2019-06-12 DIAGNOSIS — Z961 Presence of intraocular lens: Secondary | ICD-10-CM | POA: Diagnosis not present

## 2019-06-12 DIAGNOSIS — H43813 Vitreous degeneration, bilateral: Secondary | ICD-10-CM | POA: Diagnosis not present

## 2019-06-12 DIAGNOSIS — R1310 Dysphagia, unspecified: Secondary | ICD-10-CM | POA: Diagnosis not present

## 2019-06-12 DIAGNOSIS — H04123 Dry eye syndrome of bilateral lacrimal glands: Secondary | ICD-10-CM | POA: Diagnosis not present

## 2019-06-12 DIAGNOSIS — H40003 Preglaucoma, unspecified, bilateral: Secondary | ICD-10-CM | POA: Diagnosis not present

## 2019-06-13 DIAGNOSIS — I10 Essential (primary) hypertension: Secondary | ICD-10-CM | POA: Diagnosis not present

## 2019-06-13 DIAGNOSIS — R1310 Dysphagia, unspecified: Secondary | ICD-10-CM | POA: Diagnosis not present

## 2019-06-13 DIAGNOSIS — I4891 Unspecified atrial fibrillation: Secondary | ICD-10-CM | POA: Diagnosis not present

## 2019-06-13 DIAGNOSIS — I4519 Other right bundle-branch block: Secondary | ICD-10-CM | POA: Diagnosis not present

## 2019-06-13 DIAGNOSIS — R9439 Abnormal result of other cardiovascular function study: Secondary | ICD-10-CM | POA: Diagnosis not present

## 2019-06-13 DIAGNOSIS — R079 Chest pain, unspecified: Secondary | ICD-10-CM | POA: Diagnosis not present

## 2019-06-19 DIAGNOSIS — Z1159 Encounter for screening for other viral diseases: Secondary | ICD-10-CM | POA: Diagnosis not present

## 2019-06-21 DIAGNOSIS — M4310 Spondylolisthesis, site unspecified: Secondary | ICD-10-CM | POA: Diagnosis not present

## 2019-06-21 DIAGNOSIS — Z79899 Other long term (current) drug therapy: Secondary | ICD-10-CM | POA: Diagnosis not present

## 2019-06-21 DIAGNOSIS — M810 Age-related osteoporosis without current pathological fracture: Secondary | ICD-10-CM | POA: Diagnosis not present

## 2019-06-21 DIAGNOSIS — M48061 Spinal stenosis, lumbar region without neurogenic claudication: Secondary | ICD-10-CM | POA: Diagnosis not present

## 2019-06-21 DIAGNOSIS — R1310 Dysphagia, unspecified: Secondary | ICD-10-CM | POA: Diagnosis not present

## 2019-06-21 DIAGNOSIS — I4891 Unspecified atrial fibrillation: Secondary | ICD-10-CM | POA: Diagnosis not present

## 2019-06-21 DIAGNOSIS — R079 Chest pain, unspecified: Secondary | ICD-10-CM | POA: Diagnosis not present

## 2019-06-21 DIAGNOSIS — Z79891 Long term (current) use of opiate analgesic: Secondary | ICD-10-CM | POA: Diagnosis not present

## 2019-06-21 DIAGNOSIS — G4700 Insomnia, unspecified: Secondary | ICD-10-CM | POA: Diagnosis not present

## 2019-06-21 DIAGNOSIS — Z87891 Personal history of nicotine dependence: Secondary | ICD-10-CM | POA: Diagnosis not present

## 2019-06-21 DIAGNOSIS — I25119 Atherosclerotic heart disease of native coronary artery with unspecified angina pectoris: Secondary | ICD-10-CM | POA: Diagnosis not present

## 2019-06-21 DIAGNOSIS — Z7901 Long term (current) use of anticoagulants: Secondary | ICD-10-CM | POA: Diagnosis not present

## 2019-06-21 DIAGNOSIS — Z85038 Personal history of other malignant neoplasm of large intestine: Secondary | ICD-10-CM | POA: Diagnosis not present

## 2019-06-21 DIAGNOSIS — I4519 Other right bundle-branch block: Secondary | ICD-10-CM | POA: Diagnosis not present

## 2019-06-21 DIAGNOSIS — Z7902 Long term (current) use of antithrombotics/antiplatelets: Secondary | ICD-10-CM | POA: Diagnosis not present

## 2019-06-21 DIAGNOSIS — R9439 Abnormal result of other cardiovascular function study: Secondary | ICD-10-CM | POA: Diagnosis not present

## 2019-06-21 DIAGNOSIS — I10 Essential (primary) hypertension: Secondary | ICD-10-CM | POA: Diagnosis not present

## 2019-06-22 DIAGNOSIS — R1310 Dysphagia, unspecified: Secondary | ICD-10-CM | POA: Diagnosis not present

## 2019-06-22 DIAGNOSIS — I25119 Atherosclerotic heart disease of native coronary artery with unspecified angina pectoris: Secondary | ICD-10-CM | POA: Diagnosis not present

## 2019-06-22 DIAGNOSIS — I10 Essential (primary) hypertension: Secondary | ICD-10-CM | POA: Diagnosis not present

## 2019-06-22 DIAGNOSIS — R079 Chest pain, unspecified: Secondary | ICD-10-CM | POA: Diagnosis not present

## 2019-06-22 DIAGNOSIS — I4519 Other right bundle-branch block: Secondary | ICD-10-CM | POA: Diagnosis not present

## 2019-06-22 DIAGNOSIS — I4891 Unspecified atrial fibrillation: Secondary | ICD-10-CM | POA: Diagnosis not present

## 2019-06-26 DIAGNOSIS — Z1159 Encounter for screening for other viral diseases: Secondary | ICD-10-CM | POA: Diagnosis not present

## 2019-06-28 DIAGNOSIS — Z79899 Other long term (current) drug therapy: Secondary | ICD-10-CM | POA: Diagnosis not present

## 2019-06-28 DIAGNOSIS — I4891 Unspecified atrial fibrillation: Secondary | ICD-10-CM | POA: Diagnosis not present

## 2019-06-28 DIAGNOSIS — G8929 Other chronic pain: Secondary | ICD-10-CM | POA: Diagnosis not present

## 2019-06-28 DIAGNOSIS — R9439 Abnormal result of other cardiovascular function study: Secondary | ICD-10-CM | POA: Diagnosis not present

## 2019-06-28 DIAGNOSIS — I10 Essential (primary) hypertension: Secondary | ICD-10-CM | POA: Diagnosis not present

## 2019-06-28 DIAGNOSIS — Z7901 Long term (current) use of anticoagulants: Secondary | ICD-10-CM | POA: Diagnosis not present

## 2019-06-28 DIAGNOSIS — E669 Obesity, unspecified: Secondary | ICD-10-CM | POA: Diagnosis not present

## 2019-06-28 DIAGNOSIS — I25110 Atherosclerotic heart disease of native coronary artery with unstable angina pectoris: Secondary | ICD-10-CM | POA: Diagnosis not present

## 2019-06-28 DIAGNOSIS — Z7902 Long term (current) use of antithrombotics/antiplatelets: Secondary | ICD-10-CM | POA: Diagnosis not present

## 2019-06-28 DIAGNOSIS — Z6831 Body mass index (BMI) 31.0-31.9, adult: Secondary | ICD-10-CM | POA: Diagnosis not present

## 2019-06-29 DIAGNOSIS — I25110 Atherosclerotic heart disease of native coronary artery with unstable angina pectoris: Secondary | ICD-10-CM | POA: Diagnosis not present

## 2019-06-29 DIAGNOSIS — Z7901 Long term (current) use of anticoagulants: Secondary | ICD-10-CM | POA: Diagnosis not present

## 2019-06-29 DIAGNOSIS — Z6831 Body mass index (BMI) 31.0-31.9, adult: Secondary | ICD-10-CM | POA: Diagnosis not present

## 2019-06-29 DIAGNOSIS — E669 Obesity, unspecified: Secondary | ICD-10-CM | POA: Diagnosis not present

## 2019-06-29 DIAGNOSIS — Z955 Presence of coronary angioplasty implant and graft: Secondary | ICD-10-CM | POA: Diagnosis not present

## 2019-06-29 DIAGNOSIS — I2510 Atherosclerotic heart disease of native coronary artery without angina pectoris: Secondary | ICD-10-CM | POA: Diagnosis not present

## 2019-06-29 DIAGNOSIS — Z7902 Long term (current) use of antithrombotics/antiplatelets: Secondary | ICD-10-CM | POA: Diagnosis not present

## 2019-06-29 DIAGNOSIS — I4891 Unspecified atrial fibrillation: Secondary | ICD-10-CM | POA: Diagnosis not present

## 2019-07-16 DIAGNOSIS — H43813 Vitreous degeneration, bilateral: Secondary | ICD-10-CM | POA: Diagnosis not present

## 2019-07-16 DIAGNOSIS — H353131 Nonexudative age-related macular degeneration, bilateral, early dry stage: Secondary | ICD-10-CM | POA: Diagnosis not present

## 2019-07-31 DIAGNOSIS — I4519 Other right bundle-branch block: Secondary | ICD-10-CM | POA: Diagnosis not present

## 2019-07-31 DIAGNOSIS — I10 Essential (primary) hypertension: Secondary | ICD-10-CM | POA: Diagnosis not present

## 2019-07-31 DIAGNOSIS — I4891 Unspecified atrial fibrillation: Secondary | ICD-10-CM | POA: Diagnosis not present

## 2019-08-06 DIAGNOSIS — Z23 Encounter for immunization: Secondary | ICD-10-CM | POA: Diagnosis not present

## 2019-08-09 DIAGNOSIS — Z1389 Encounter for screening for other disorder: Secondary | ICD-10-CM | POA: Diagnosis not present

## 2019-08-09 DIAGNOSIS — Z0000 Encounter for general adult medical examination without abnormal findings: Secondary | ICD-10-CM | POA: Diagnosis not present

## 2019-08-09 DIAGNOSIS — Z1331 Encounter for screening for depression: Secondary | ICD-10-CM | POA: Diagnosis not present

## 2019-08-09 DIAGNOSIS — H9313 Tinnitus, bilateral: Secondary | ICD-10-CM | POA: Diagnosis not present

## 2019-08-21 ENCOUNTER — Other Ambulatory Visit: Payer: Self-pay | Admitting: Family Medicine

## 2019-08-22 DIAGNOSIS — H401231 Low-tension glaucoma, bilateral, mild stage: Secondary | ICD-10-CM | POA: Diagnosis not present

## 2019-08-22 DIAGNOSIS — H353131 Nonexudative age-related macular degeneration, bilateral, early dry stage: Secondary | ICD-10-CM | POA: Diagnosis not present

## 2019-10-19 DIAGNOSIS — H401231 Low-tension glaucoma, bilateral, mild stage: Secondary | ICD-10-CM | POA: Diagnosis not present

## 2019-10-19 DIAGNOSIS — H353131 Nonexudative age-related macular degeneration, bilateral, early dry stage: Secondary | ICD-10-CM | POA: Diagnosis not present

## 2019-11-02 DIAGNOSIS — Z1231 Encounter for screening mammogram for malignant neoplasm of breast: Secondary | ICD-10-CM | POA: Diagnosis not present

## 2019-12-14 DIAGNOSIS — K589 Irritable bowel syndrome without diarrhea: Secondary | ICD-10-CM | POA: Diagnosis not present

## 2019-12-14 DIAGNOSIS — I4891 Unspecified atrial fibrillation: Secondary | ICD-10-CM | POA: Diagnosis not present

## 2019-12-14 DIAGNOSIS — M79671 Pain in right foot: Secondary | ICD-10-CM | POA: Diagnosis not present

## 2019-12-17 DIAGNOSIS — Z23 Encounter for immunization: Secondary | ICD-10-CM | POA: Diagnosis not present

## 2019-12-20 ENCOUNTER — Other Ambulatory Visit: Payer: Self-pay | Admitting: Family Medicine

## 2020-01-07 DIAGNOSIS — Z23 Encounter for immunization: Secondary | ICD-10-CM | POA: Diagnosis not present

## 2020-01-11 DIAGNOSIS — M4310 Spondylolisthesis, site unspecified: Secondary | ICD-10-CM | POA: Diagnosis not present

## 2020-01-11 DIAGNOSIS — G4709 Other insomnia: Secondary | ICD-10-CM | POA: Diagnosis not present

## 2020-01-11 DIAGNOSIS — I4891 Unspecified atrial fibrillation: Secondary | ICD-10-CM | POA: Diagnosis not present

## 2020-01-16 DIAGNOSIS — H401231 Low-tension glaucoma, bilateral, mild stage: Secondary | ICD-10-CM | POA: Diagnosis not present

## 2020-01-16 DIAGNOSIS — H04123 Dry eye syndrome of bilateral lacrimal glands: Secondary | ICD-10-CM | POA: Diagnosis not present

## 2020-01-16 DIAGNOSIS — H353132 Nonexudative age-related macular degeneration, bilateral, intermediate dry stage: Secondary | ICD-10-CM | POA: Diagnosis not present

## 2020-02-11 DIAGNOSIS — T148XXA Other injury of unspecified body region, initial encounter: Secondary | ICD-10-CM | POA: Diagnosis not present

## 2020-02-11 DIAGNOSIS — I4891 Unspecified atrial fibrillation: Secondary | ICD-10-CM | POA: Diagnosis not present

## 2020-05-07 DIAGNOSIS — M545 Low back pain: Secondary | ICD-10-CM | POA: Diagnosis not present

## 2020-05-07 DIAGNOSIS — M4854XA Collapsed vertebra, not elsewhere classified, thoracic region, initial encounter for fracture: Secondary | ICD-10-CM | POA: Diagnosis not present

## 2020-05-07 DIAGNOSIS — M47896 Other spondylosis, lumbar region: Secondary | ICD-10-CM | POA: Diagnosis not present

## 2020-05-07 DIAGNOSIS — M48062 Spinal stenosis, lumbar region with neurogenic claudication: Secondary | ICD-10-CM | POA: Diagnosis not present

## 2020-05-07 DIAGNOSIS — M4856XA Collapsed vertebra, not elsewhere classified, lumbar region, initial encounter for fracture: Secondary | ICD-10-CM | POA: Diagnosis not present

## 2020-05-07 DIAGNOSIS — M5416 Radiculopathy, lumbar region: Secondary | ICD-10-CM | POA: Diagnosis not present

## 2020-05-21 DIAGNOSIS — M4854XA Collapsed vertebra, not elsewhere classified, thoracic region, initial encounter for fracture: Secondary | ICD-10-CM | POA: Diagnosis not present

## 2020-05-21 DIAGNOSIS — M4317 Spondylolisthesis, lumbosacral region: Secondary | ICD-10-CM | POA: Diagnosis not present

## 2020-05-21 DIAGNOSIS — M47816 Spondylosis without myelopathy or radiculopathy, lumbar region: Secondary | ICD-10-CM | POA: Diagnosis not present

## 2020-05-21 DIAGNOSIS — M47896 Other spondylosis, lumbar region: Secondary | ICD-10-CM | POA: Diagnosis not present

## 2020-05-21 DIAGNOSIS — M438X6 Other specified deforming dorsopathies, lumbar region: Secondary | ICD-10-CM | POA: Diagnosis not present

## 2020-05-21 DIAGNOSIS — M4804 Spinal stenosis, thoracic region: Secondary | ICD-10-CM | POA: Diagnosis not present

## 2020-05-21 DIAGNOSIS — M5033 Other cervical disc degeneration, cervicothoracic region: Secondary | ICD-10-CM | POA: Diagnosis not present

## 2020-05-22 DIAGNOSIS — M159 Polyosteoarthritis, unspecified: Secondary | ICD-10-CM | POA: Diagnosis not present

## 2020-05-22 DIAGNOSIS — M810 Age-related osteoporosis without current pathological fracture: Secondary | ICD-10-CM | POA: Diagnosis not present

## 2020-07-03 DIAGNOSIS — I25119 Atherosclerotic heart disease of native coronary artery with unspecified angina pectoris: Secondary | ICD-10-CM | POA: Diagnosis not present

## 2020-07-03 DIAGNOSIS — E669 Obesity, unspecified: Secondary | ICD-10-CM | POA: Diagnosis not present

## 2020-07-03 DIAGNOSIS — I25110 Atherosclerotic heart disease of native coronary artery with unstable angina pectoris: Secondary | ICD-10-CM | POA: Diagnosis not present

## 2020-07-03 DIAGNOSIS — G8929 Other chronic pain: Secondary | ICD-10-CM | POA: Diagnosis not present

## 2020-07-17 DIAGNOSIS — W010XXA Fall on same level from slipping, tripping and stumbling without subsequent striking against object, initial encounter: Secondary | ICD-10-CM | POA: Diagnosis not present

## 2020-07-17 DIAGNOSIS — G894 Chronic pain syndrome: Secondary | ICD-10-CM | POA: Diagnosis not present

## 2020-07-17 DIAGNOSIS — S59902A Unspecified injury of left elbow, initial encounter: Secondary | ICD-10-CM | POA: Diagnosis not present

## 2020-07-17 DIAGNOSIS — S32502A Unspecified fracture of left pubis, initial encounter for closed fracture: Secondary | ICD-10-CM | POA: Diagnosis not present

## 2020-07-17 DIAGNOSIS — I2510 Atherosclerotic heart disease of native coronary artery without angina pectoris: Secondary | ICD-10-CM | POA: Diagnosis not present

## 2020-07-17 DIAGNOSIS — S32592A Other specified fracture of left pubis, initial encounter for closed fracture: Secondary | ICD-10-CM | POA: Diagnosis not present

## 2020-07-17 DIAGNOSIS — S02642A Fracture of ramus of left mandible, initial encounter for closed fracture: Secondary | ICD-10-CM | POA: Diagnosis not present

## 2020-07-17 DIAGNOSIS — R079 Chest pain, unspecified: Secondary | ICD-10-CM | POA: Diagnosis not present

## 2020-07-17 DIAGNOSIS — J449 Chronic obstructive pulmonary disease, unspecified: Secondary | ICD-10-CM | POA: Diagnosis not present

## 2020-07-17 DIAGNOSIS — I10 Essential (primary) hypertension: Secondary | ICD-10-CM | POA: Diagnosis not present

## 2020-07-17 DIAGNOSIS — N179 Acute kidney failure, unspecified: Secondary | ICD-10-CM | POA: Diagnosis not present

## 2020-07-17 DIAGNOSIS — I4891 Unspecified atrial fibrillation: Secondary | ICD-10-CM | POA: Diagnosis not present

## 2020-07-17 DIAGNOSIS — M25522 Pain in left elbow: Secondary | ICD-10-CM | POA: Diagnosis not present

## 2020-07-17 DIAGNOSIS — S50312A Abrasion of left elbow, initial encounter: Secondary | ICD-10-CM | POA: Diagnosis not present

## 2020-07-17 DIAGNOSIS — S36892A Contusion of other intra-abdominal organs, initial encounter: Secondary | ICD-10-CM | POA: Diagnosis not present

## 2020-07-18 DIAGNOSIS — S32592A Other specified fracture of left pubis, initial encounter for closed fracture: Secondary | ICD-10-CM | POA: Diagnosis present

## 2020-07-18 DIAGNOSIS — Z96611 Presence of right artificial shoulder joint: Secondary | ICD-10-CM | POA: Diagnosis present

## 2020-07-18 DIAGNOSIS — H409 Unspecified glaucoma: Secondary | ICD-10-CM | POA: Diagnosis not present

## 2020-07-18 DIAGNOSIS — E569 Vitamin deficiency, unspecified: Secondary | ICD-10-CM | POA: Diagnosis not present

## 2020-07-18 DIAGNOSIS — I4891 Unspecified atrial fibrillation: Secondary | ICD-10-CM | POA: Diagnosis present

## 2020-07-18 DIAGNOSIS — Z85038 Personal history of other malignant neoplasm of large intestine: Secondary | ICD-10-CM | POA: Diagnosis not present

## 2020-07-18 DIAGNOSIS — S50312A Abrasion of left elbow, initial encounter: Secondary | ICD-10-CM | POA: Diagnosis present

## 2020-07-18 DIAGNOSIS — Z20822 Contact with and (suspected) exposure to covid-19: Secondary | ICD-10-CM | POA: Diagnosis present

## 2020-07-18 DIAGNOSIS — G4751 Confusional arousals: Secondary | ICD-10-CM | POA: Diagnosis not present

## 2020-07-18 DIAGNOSIS — I2510 Atherosclerotic heart disease of native coronary artery without angina pectoris: Secondary | ICD-10-CM | POA: Diagnosis present

## 2020-07-18 DIAGNOSIS — I10 Essential (primary) hypertension: Secondary | ICD-10-CM | POA: Diagnosis present

## 2020-07-18 DIAGNOSIS — Z8249 Family history of ischemic heart disease and other diseases of the circulatory system: Secondary | ICD-10-CM | POA: Diagnosis not present

## 2020-07-18 DIAGNOSIS — M25552 Pain in left hip: Secondary | ICD-10-CM | POA: Diagnosis not present

## 2020-07-18 DIAGNOSIS — Z87891 Personal history of nicotine dependence: Secondary | ICD-10-CM | POA: Diagnosis not present

## 2020-07-18 DIAGNOSIS — J449 Chronic obstructive pulmonary disease, unspecified: Secondary | ICD-10-CM | POA: Diagnosis present

## 2020-07-18 DIAGNOSIS — K661 Hemoperitoneum: Secondary | ICD-10-CM | POA: Diagnosis not present

## 2020-07-18 DIAGNOSIS — I4510 Unspecified right bundle-branch block: Secondary | ICD-10-CM | POA: Diagnosis present

## 2020-07-18 DIAGNOSIS — J309 Allergic rhinitis, unspecified: Secondary | ICD-10-CM | POA: Diagnosis not present

## 2020-07-18 DIAGNOSIS — I739 Peripheral vascular disease, unspecified: Secondary | ICD-10-CM | POA: Diagnosis present

## 2020-07-18 DIAGNOSIS — J45909 Unspecified asthma, uncomplicated: Secondary | ICD-10-CM | POA: Diagnosis not present

## 2020-07-18 DIAGNOSIS — E785 Hyperlipidemia, unspecified: Secondary | ICD-10-CM | POA: Diagnosis not present

## 2020-07-18 DIAGNOSIS — S36892A Contusion of other intra-abdominal organs, initial encounter: Secondary | ICD-10-CM | POA: Diagnosis present

## 2020-07-18 DIAGNOSIS — Z79899 Other long term (current) drug therapy: Secondary | ICD-10-CM | POA: Diagnosis not present

## 2020-07-18 DIAGNOSIS — M810 Age-related osteoporosis without current pathological fracture: Secondary | ICD-10-CM | POA: Diagnosis present

## 2020-07-18 DIAGNOSIS — M1389 Other specified arthritis, multiple sites: Secondary | ICD-10-CM | POA: Diagnosis not present

## 2020-07-18 DIAGNOSIS — S32502D Unspecified fracture of left pubis, subsequent encounter for fracture with routine healing: Secondary | ICD-10-CM | POA: Diagnosis not present

## 2020-07-18 DIAGNOSIS — S32502A Unspecified fracture of left pubis, initial encounter for closed fracture: Secondary | ICD-10-CM | POA: Diagnosis not present

## 2020-07-18 DIAGNOSIS — G894 Chronic pain syndrome: Secondary | ICD-10-CM | POA: Diagnosis present

## 2020-07-18 DIAGNOSIS — K5900 Constipation, unspecified: Secondary | ICD-10-CM | POA: Diagnosis not present

## 2020-07-18 DIAGNOSIS — N179 Acute kidney failure, unspecified: Secondary | ICD-10-CM | POA: Diagnosis present

## 2020-07-18 DIAGNOSIS — R52 Pain, unspecified: Secondary | ICD-10-CM | POA: Diagnosis not present

## 2020-07-18 DIAGNOSIS — G4709 Other insomnia: Secondary | ICD-10-CM | POA: Diagnosis not present

## 2020-07-18 DIAGNOSIS — W19XXXA Unspecified fall, initial encounter: Secondary | ICD-10-CM | POA: Diagnosis not present

## 2020-07-21 DIAGNOSIS — E569 Vitamin deficiency, unspecified: Secondary | ICD-10-CM | POA: Diagnosis not present

## 2020-07-21 DIAGNOSIS — G4709 Other insomnia: Secondary | ICD-10-CM | POA: Diagnosis not present

## 2020-07-21 DIAGNOSIS — R52 Pain, unspecified: Secondary | ICD-10-CM | POA: Diagnosis not present

## 2020-07-21 DIAGNOSIS — Z85038 Personal history of other malignant neoplasm of large intestine: Secondary | ICD-10-CM | POA: Diagnosis not present

## 2020-07-21 DIAGNOSIS — J309 Allergic rhinitis, unspecified: Secondary | ICD-10-CM | POA: Diagnosis not present

## 2020-07-21 DIAGNOSIS — J449 Chronic obstructive pulmonary disease, unspecified: Secondary | ICD-10-CM | POA: Diagnosis not present

## 2020-07-21 DIAGNOSIS — I739 Peripheral vascular disease, unspecified: Secondary | ICD-10-CM | POA: Diagnosis not present

## 2020-07-21 DIAGNOSIS — I10 Essential (primary) hypertension: Secondary | ICD-10-CM | POA: Diagnosis not present

## 2020-07-21 DIAGNOSIS — R600 Localized edema: Secondary | ICD-10-CM | POA: Diagnosis not present

## 2020-07-21 DIAGNOSIS — K5900 Constipation, unspecified: Secondary | ICD-10-CM | POA: Diagnosis not present

## 2020-07-21 DIAGNOSIS — S32502A Unspecified fracture of left pubis, initial encounter for closed fracture: Secondary | ICD-10-CM | POA: Diagnosis not present

## 2020-07-21 DIAGNOSIS — K661 Hemoperitoneum: Secondary | ICD-10-CM | POA: Diagnosis not present

## 2020-07-21 DIAGNOSIS — M6281 Muscle weakness (generalized): Secondary | ICD-10-CM | POA: Diagnosis not present

## 2020-07-21 DIAGNOSIS — J45909 Unspecified asthma, uncomplicated: Secondary | ICD-10-CM | POA: Diagnosis not present

## 2020-07-21 DIAGNOSIS — R197 Diarrhea, unspecified: Secondary | ICD-10-CM | POA: Diagnosis not present

## 2020-07-21 DIAGNOSIS — G4751 Confusional arousals: Secondary | ICD-10-CM | POA: Diagnosis not present

## 2020-07-21 DIAGNOSIS — E876 Hypokalemia: Secondary | ICD-10-CM | POA: Diagnosis not present

## 2020-07-21 DIAGNOSIS — I2510 Atherosclerotic heart disease of native coronary artery without angina pectoris: Secondary | ICD-10-CM | POA: Diagnosis not present

## 2020-07-21 DIAGNOSIS — H409 Unspecified glaucoma: Secondary | ICD-10-CM | POA: Diagnosis not present

## 2020-07-21 DIAGNOSIS — M1389 Other specified arthritis, multiple sites: Secondary | ICD-10-CM | POA: Diagnosis not present

## 2020-07-21 DIAGNOSIS — M810 Age-related osteoporosis without current pathological fracture: Secondary | ICD-10-CM | POA: Diagnosis not present

## 2020-07-21 DIAGNOSIS — E871 Hypo-osmolality and hyponatremia: Secondary | ICD-10-CM | POA: Diagnosis not present

## 2020-07-21 DIAGNOSIS — L03119 Cellulitis of unspecified part of limb: Secondary | ICD-10-CM | POA: Diagnosis not present

## 2020-07-21 DIAGNOSIS — E785 Hyperlipidemia, unspecified: Secondary | ICD-10-CM | POA: Diagnosis not present

## 2020-07-21 DIAGNOSIS — Z23 Encounter for immunization: Secondary | ICD-10-CM | POA: Diagnosis not present

## 2020-07-21 DIAGNOSIS — S32591D Other specified fracture of right pubis, subsequent encounter for fracture with routine healing: Secondary | ICD-10-CM | POA: Diagnosis not present

## 2020-07-21 DIAGNOSIS — S32502D Unspecified fracture of left pubis, subsequent encounter for fracture with routine healing: Secondary | ICD-10-CM | POA: Diagnosis not present

## 2020-07-21 DIAGNOSIS — G4700 Insomnia, unspecified: Secondary | ICD-10-CM | POA: Diagnosis not present

## 2020-07-21 DIAGNOSIS — W19XXXA Unspecified fall, initial encounter: Secondary | ICD-10-CM | POA: Diagnosis not present

## 2020-07-21 DIAGNOSIS — S32592A Other specified fracture of left pubis, initial encounter for closed fracture: Secondary | ICD-10-CM | POA: Diagnosis not present

## 2020-07-21 DIAGNOSIS — I4891 Unspecified atrial fibrillation: Secondary | ICD-10-CM | POA: Diagnosis not present

## 2020-07-22 DIAGNOSIS — G4700 Insomnia, unspecified: Secondary | ICD-10-CM | POA: Diagnosis not present

## 2020-07-22 DIAGNOSIS — I4891 Unspecified atrial fibrillation: Secondary | ICD-10-CM | POA: Diagnosis not present

## 2020-07-22 DIAGNOSIS — S32502A Unspecified fracture of left pubis, initial encounter for closed fracture: Secondary | ICD-10-CM | POA: Diagnosis not present

## 2020-07-22 DIAGNOSIS — E785 Hyperlipidemia, unspecified: Secondary | ICD-10-CM | POA: Diagnosis not present

## 2020-07-22 DIAGNOSIS — I10 Essential (primary) hypertension: Secondary | ICD-10-CM | POA: Diagnosis not present

## 2020-07-22 DIAGNOSIS — J45909 Unspecified asthma, uncomplicated: Secondary | ICD-10-CM | POA: Diagnosis not present

## 2020-07-23 DIAGNOSIS — M6281 Muscle weakness (generalized): Secondary | ICD-10-CM | POA: Diagnosis not present

## 2020-07-23 DIAGNOSIS — R52 Pain, unspecified: Secondary | ICD-10-CM | POA: Diagnosis not present

## 2020-07-24 DIAGNOSIS — I10 Essential (primary) hypertension: Secondary | ICD-10-CM | POA: Diagnosis not present

## 2020-07-24 DIAGNOSIS — M6281 Muscle weakness (generalized): Secondary | ICD-10-CM | POA: Diagnosis not present

## 2020-07-24 DIAGNOSIS — W19XXXA Unspecified fall, initial encounter: Secondary | ICD-10-CM | POA: Diagnosis not present

## 2020-07-29 DIAGNOSIS — M6281 Muscle weakness (generalized): Secondary | ICD-10-CM | POA: Diagnosis not present

## 2020-07-29 DIAGNOSIS — I10 Essential (primary) hypertension: Secondary | ICD-10-CM | POA: Diagnosis not present

## 2020-07-30 DIAGNOSIS — S32502A Unspecified fracture of left pubis, initial encounter for closed fracture: Secondary | ICD-10-CM | POA: Diagnosis not present

## 2020-07-30 DIAGNOSIS — I4891 Unspecified atrial fibrillation: Secondary | ICD-10-CM | POA: Diagnosis not present

## 2020-07-30 DIAGNOSIS — I10 Essential (primary) hypertension: Secondary | ICD-10-CM | POA: Diagnosis not present

## 2020-07-30 DIAGNOSIS — R197 Diarrhea, unspecified: Secondary | ICD-10-CM | POA: Diagnosis not present

## 2020-07-31 DIAGNOSIS — S32592A Other specified fracture of left pubis, initial encounter for closed fracture: Secondary | ICD-10-CM | POA: Diagnosis not present

## 2020-08-04 DIAGNOSIS — I10 Essential (primary) hypertension: Secondary | ICD-10-CM | POA: Diagnosis not present

## 2020-08-04 DIAGNOSIS — L03119 Cellulitis of unspecified part of limb: Secondary | ICD-10-CM | POA: Diagnosis not present

## 2020-08-04 DIAGNOSIS — E871 Hypo-osmolality and hyponatremia: Secondary | ICD-10-CM | POA: Diagnosis not present

## 2020-08-04 DIAGNOSIS — S32502A Unspecified fracture of left pubis, initial encounter for closed fracture: Secondary | ICD-10-CM | POA: Diagnosis not present

## 2020-08-05 DIAGNOSIS — M6281 Muscle weakness (generalized): Secondary | ICD-10-CM | POA: Diagnosis not present

## 2020-08-05 DIAGNOSIS — R600 Localized edema: Secondary | ICD-10-CM | POA: Diagnosis not present

## 2020-08-05 DIAGNOSIS — I10 Essential (primary) hypertension: Secondary | ICD-10-CM | POA: Diagnosis not present

## 2020-08-08 DIAGNOSIS — E876 Hypokalemia: Secondary | ICD-10-CM | POA: Diagnosis not present

## 2020-08-11 DIAGNOSIS — L03119 Cellulitis of unspecified part of limb: Secondary | ICD-10-CM | POA: Diagnosis not present

## 2020-08-11 DIAGNOSIS — S32502A Unspecified fracture of left pubis, initial encounter for closed fracture: Secondary | ICD-10-CM | POA: Diagnosis not present

## 2020-08-11 DIAGNOSIS — E871 Hypo-osmolality and hyponatremia: Secondary | ICD-10-CM | POA: Diagnosis not present

## 2020-08-11 DIAGNOSIS — R600 Localized edema: Secondary | ICD-10-CM | POA: Diagnosis not present

## 2020-08-12 DIAGNOSIS — R600 Localized edema: Secondary | ICD-10-CM | POA: Diagnosis not present

## 2020-08-12 DIAGNOSIS — E876 Hypokalemia: Secondary | ICD-10-CM | POA: Diagnosis not present

## 2020-08-12 DIAGNOSIS — M6281 Muscle weakness (generalized): Secondary | ICD-10-CM | POA: Diagnosis not present

## 2020-08-18 DIAGNOSIS — E876 Hypokalemia: Secondary | ICD-10-CM | POA: Diagnosis not present

## 2020-08-18 DIAGNOSIS — S32502A Unspecified fracture of left pubis, initial encounter for closed fracture: Secondary | ICD-10-CM | POA: Diagnosis not present

## 2020-08-18 DIAGNOSIS — R600 Localized edema: Secondary | ICD-10-CM | POA: Diagnosis not present

## 2020-08-19 DIAGNOSIS — M6281 Muscle weakness (generalized): Secondary | ICD-10-CM | POA: Diagnosis not present

## 2020-08-19 DIAGNOSIS — E876 Hypokalemia: Secondary | ICD-10-CM | POA: Diagnosis not present

## 2020-08-21 DIAGNOSIS — Z23 Encounter for immunization: Secondary | ICD-10-CM | POA: Diagnosis not present

## 2020-08-25 DIAGNOSIS — I4891 Unspecified atrial fibrillation: Secondary | ICD-10-CM | POA: Diagnosis not present

## 2020-08-25 DIAGNOSIS — I10 Essential (primary) hypertension: Secondary | ICD-10-CM | POA: Diagnosis not present

## 2020-08-25 DIAGNOSIS — S32502A Unspecified fracture of left pubis, initial encounter for closed fracture: Secondary | ICD-10-CM | POA: Diagnosis not present

## 2020-08-28 DIAGNOSIS — S32591D Other specified fracture of right pubis, subsequent encounter for fracture with routine healing: Secondary | ICD-10-CM | POA: Diagnosis not present

## 2020-08-28 DIAGNOSIS — R600 Localized edema: Secondary | ICD-10-CM | POA: Diagnosis not present

## 2020-08-28 DIAGNOSIS — M6281 Muscle weakness (generalized): Secondary | ICD-10-CM | POA: Diagnosis not present

## 2020-08-31 DIAGNOSIS — S32502A Unspecified fracture of left pubis, initial encounter for closed fracture: Secondary | ICD-10-CM | POA: Diagnosis not present

## 2020-08-31 DIAGNOSIS — I4891 Unspecified atrial fibrillation: Secondary | ICD-10-CM | POA: Diagnosis not present

## 2020-08-31 DIAGNOSIS — I10 Essential (primary) hypertension: Secondary | ICD-10-CM | POA: Diagnosis not present

## 2020-09-02 DIAGNOSIS — R600 Localized edema: Secondary | ICD-10-CM | POA: Diagnosis not present

## 2020-09-02 DIAGNOSIS — M6281 Muscle weakness (generalized): Secondary | ICD-10-CM | POA: Diagnosis not present

## 2020-09-03 DIAGNOSIS — G4700 Insomnia, unspecified: Secondary | ICD-10-CM | POA: Diagnosis not present

## 2020-09-03 DIAGNOSIS — E785 Hyperlipidemia, unspecified: Secondary | ICD-10-CM | POA: Diagnosis not present

## 2020-09-03 DIAGNOSIS — R52 Pain, unspecified: Secondary | ICD-10-CM | POA: Diagnosis not present

## 2020-09-03 DIAGNOSIS — I10 Essential (primary) hypertension: Secondary | ICD-10-CM | POA: Diagnosis not present

## 2020-09-03 DIAGNOSIS — R600 Localized edema: Secondary | ICD-10-CM | POA: Diagnosis not present

## 2020-09-06 DIAGNOSIS — Z7982 Long term (current) use of aspirin: Secondary | ICD-10-CM | POA: Diagnosis not present

## 2020-09-06 DIAGNOSIS — Z7951 Long term (current) use of inhaled steroids: Secondary | ICD-10-CM | POA: Diagnosis not present

## 2020-09-06 DIAGNOSIS — I10 Essential (primary) hypertension: Secondary | ICD-10-CM | POA: Diagnosis not present

## 2020-09-06 DIAGNOSIS — J45909 Unspecified asthma, uncomplicated: Secondary | ICD-10-CM | POA: Diagnosis not present

## 2020-09-06 DIAGNOSIS — S32502D Unspecified fracture of left pubis, subsequent encounter for fracture with routine healing: Secondary | ICD-10-CM | POA: Diagnosis not present

## 2020-09-06 DIAGNOSIS — I4891 Unspecified atrial fibrillation: Secondary | ICD-10-CM | POA: Diagnosis not present

## 2020-09-06 DIAGNOSIS — Z7901 Long term (current) use of anticoagulants: Secondary | ICD-10-CM | POA: Diagnosis not present

## 2020-09-06 DIAGNOSIS — I739 Peripheral vascular disease, unspecified: Secondary | ICD-10-CM | POA: Diagnosis not present

## 2020-09-09 DIAGNOSIS — I739 Peripheral vascular disease, unspecified: Secondary | ICD-10-CM | POA: Diagnosis not present

## 2020-09-09 DIAGNOSIS — S32502D Unspecified fracture of left pubis, subsequent encounter for fracture with routine healing: Secondary | ICD-10-CM | POA: Diagnosis not present

## 2020-09-09 DIAGNOSIS — Z7982 Long term (current) use of aspirin: Secondary | ICD-10-CM | POA: Diagnosis not present

## 2020-09-09 DIAGNOSIS — I4891 Unspecified atrial fibrillation: Secondary | ICD-10-CM | POA: Diagnosis not present

## 2020-09-09 DIAGNOSIS — J45909 Unspecified asthma, uncomplicated: Secondary | ICD-10-CM | POA: Diagnosis not present

## 2020-09-09 DIAGNOSIS — I10 Essential (primary) hypertension: Secondary | ICD-10-CM | POA: Diagnosis not present

## 2020-09-10 DIAGNOSIS — S81811A Laceration without foreign body, right lower leg, initial encounter: Secondary | ICD-10-CM | POA: Diagnosis not present

## 2020-09-10 DIAGNOSIS — M810 Age-related osteoporosis without current pathological fracture: Secondary | ICD-10-CM | POA: Diagnosis not present

## 2020-09-10 DIAGNOSIS — I4519 Other right bundle-branch block: Secondary | ICD-10-CM | POA: Diagnosis not present

## 2020-09-10 DIAGNOSIS — I4891 Unspecified atrial fibrillation: Secondary | ICD-10-CM | POA: Diagnosis not present

## 2020-09-10 DIAGNOSIS — M4690 Unspecified inflammatory spondylopathy, site unspecified: Secondary | ICD-10-CM | POA: Diagnosis not present

## 2020-09-10 DIAGNOSIS — M4856XD Collapsed vertebra, not elsewhere classified, lumbar region, subsequent encounter for fracture with routine healing: Secondary | ICD-10-CM | POA: Diagnosis not present

## 2020-09-11 DIAGNOSIS — J45909 Unspecified asthma, uncomplicated: Secondary | ICD-10-CM | POA: Diagnosis not present

## 2020-09-11 DIAGNOSIS — I4891 Unspecified atrial fibrillation: Secondary | ICD-10-CM | POA: Diagnosis not present

## 2020-09-11 DIAGNOSIS — Z7982 Long term (current) use of aspirin: Secondary | ICD-10-CM | POA: Diagnosis not present

## 2020-09-11 DIAGNOSIS — I739 Peripheral vascular disease, unspecified: Secondary | ICD-10-CM | POA: Diagnosis not present

## 2020-09-11 DIAGNOSIS — S32502D Unspecified fracture of left pubis, subsequent encounter for fracture with routine healing: Secondary | ICD-10-CM | POA: Diagnosis not present

## 2020-09-11 DIAGNOSIS — I10 Essential (primary) hypertension: Secondary | ICD-10-CM | POA: Diagnosis not present

## 2020-09-12 DIAGNOSIS — Z7982 Long term (current) use of aspirin: Secondary | ICD-10-CM | POA: Diagnosis not present

## 2020-09-12 DIAGNOSIS — J45909 Unspecified asthma, uncomplicated: Secondary | ICD-10-CM | POA: Diagnosis not present

## 2020-09-12 DIAGNOSIS — I4891 Unspecified atrial fibrillation: Secondary | ICD-10-CM | POA: Diagnosis not present

## 2020-09-12 DIAGNOSIS — I739 Peripheral vascular disease, unspecified: Secondary | ICD-10-CM | POA: Diagnosis not present

## 2020-09-12 DIAGNOSIS — S32502D Unspecified fracture of left pubis, subsequent encounter for fracture with routine healing: Secondary | ICD-10-CM | POA: Diagnosis not present

## 2020-09-12 DIAGNOSIS — I10 Essential (primary) hypertension: Secondary | ICD-10-CM | POA: Diagnosis not present

## 2020-09-16 DIAGNOSIS — S32502D Unspecified fracture of left pubis, subsequent encounter for fracture with routine healing: Secondary | ICD-10-CM | POA: Diagnosis not present

## 2020-09-16 DIAGNOSIS — I739 Peripheral vascular disease, unspecified: Secondary | ICD-10-CM | POA: Diagnosis not present

## 2020-09-16 DIAGNOSIS — J45909 Unspecified asthma, uncomplicated: Secondary | ICD-10-CM | POA: Diagnosis not present

## 2020-09-16 DIAGNOSIS — I10 Essential (primary) hypertension: Secondary | ICD-10-CM | POA: Diagnosis not present

## 2020-09-16 DIAGNOSIS — Z7982 Long term (current) use of aspirin: Secondary | ICD-10-CM | POA: Diagnosis not present

## 2020-09-16 DIAGNOSIS — I4891 Unspecified atrial fibrillation: Secondary | ICD-10-CM | POA: Diagnosis not present

## 2020-09-18 DIAGNOSIS — I10 Essential (primary) hypertension: Secondary | ICD-10-CM | POA: Diagnosis not present

## 2020-09-18 DIAGNOSIS — S32502D Unspecified fracture of left pubis, subsequent encounter for fracture with routine healing: Secondary | ICD-10-CM | POA: Diagnosis not present

## 2020-09-18 DIAGNOSIS — I739 Peripheral vascular disease, unspecified: Secondary | ICD-10-CM | POA: Diagnosis not present

## 2020-09-18 DIAGNOSIS — I4891 Unspecified atrial fibrillation: Secondary | ICD-10-CM | POA: Diagnosis not present

## 2020-09-18 DIAGNOSIS — Z7982 Long term (current) use of aspirin: Secondary | ICD-10-CM | POA: Diagnosis not present

## 2020-09-18 DIAGNOSIS — J45909 Unspecified asthma, uncomplicated: Secondary | ICD-10-CM | POA: Diagnosis not present

## 2020-09-19 DIAGNOSIS — I4891 Unspecified atrial fibrillation: Secondary | ICD-10-CM | POA: Diagnosis not present

## 2020-09-19 DIAGNOSIS — J45909 Unspecified asthma, uncomplicated: Secondary | ICD-10-CM | POA: Diagnosis not present

## 2020-09-19 DIAGNOSIS — I739 Peripheral vascular disease, unspecified: Secondary | ICD-10-CM | POA: Diagnosis not present

## 2020-09-19 DIAGNOSIS — I10 Essential (primary) hypertension: Secondary | ICD-10-CM | POA: Diagnosis not present

## 2020-09-19 DIAGNOSIS — S32502D Unspecified fracture of left pubis, subsequent encounter for fracture with routine healing: Secondary | ICD-10-CM | POA: Diagnosis not present

## 2020-09-19 DIAGNOSIS — Z7982 Long term (current) use of aspirin: Secondary | ICD-10-CM | POA: Diagnosis not present

## 2020-09-22 DIAGNOSIS — I10 Essential (primary) hypertension: Secondary | ICD-10-CM | POA: Diagnosis not present

## 2020-09-22 DIAGNOSIS — I4891 Unspecified atrial fibrillation: Secondary | ICD-10-CM | POA: Diagnosis not present

## 2020-09-22 DIAGNOSIS — I739 Peripheral vascular disease, unspecified: Secondary | ICD-10-CM | POA: Diagnosis not present

## 2020-09-22 DIAGNOSIS — J45909 Unspecified asthma, uncomplicated: Secondary | ICD-10-CM | POA: Diagnosis not present

## 2020-09-22 DIAGNOSIS — Z7982 Long term (current) use of aspirin: Secondary | ICD-10-CM | POA: Diagnosis not present

## 2020-09-22 DIAGNOSIS — S32502D Unspecified fracture of left pubis, subsequent encounter for fracture with routine healing: Secondary | ICD-10-CM | POA: Diagnosis not present

## 2020-09-23 DIAGNOSIS — I4891 Unspecified atrial fibrillation: Secondary | ICD-10-CM | POA: Diagnosis not present

## 2020-09-23 DIAGNOSIS — I10 Essential (primary) hypertension: Secondary | ICD-10-CM | POA: Diagnosis not present

## 2020-09-23 DIAGNOSIS — Z7982 Long term (current) use of aspirin: Secondary | ICD-10-CM | POA: Diagnosis not present

## 2020-09-23 DIAGNOSIS — I739 Peripheral vascular disease, unspecified: Secondary | ICD-10-CM | POA: Diagnosis not present

## 2020-09-23 DIAGNOSIS — J45909 Unspecified asthma, uncomplicated: Secondary | ICD-10-CM | POA: Diagnosis not present

## 2020-09-23 DIAGNOSIS — S32502D Unspecified fracture of left pubis, subsequent encounter for fracture with routine healing: Secondary | ICD-10-CM | POA: Diagnosis not present

## 2020-09-24 DIAGNOSIS — I10 Essential (primary) hypertension: Secondary | ICD-10-CM | POA: Diagnosis not present

## 2020-09-24 DIAGNOSIS — S81811A Laceration without foreign body, right lower leg, initial encounter: Secondary | ICD-10-CM | POA: Diagnosis not present

## 2020-09-24 DIAGNOSIS — M48062 Spinal stenosis, lumbar region with neurogenic claudication: Secondary | ICD-10-CM | POA: Diagnosis not present

## 2020-09-24 DIAGNOSIS — R600 Localized edema: Secondary | ICD-10-CM | POA: Diagnosis not present

## 2020-09-25 DIAGNOSIS — I739 Peripheral vascular disease, unspecified: Secondary | ICD-10-CM | POA: Diagnosis not present

## 2020-09-25 DIAGNOSIS — I4891 Unspecified atrial fibrillation: Secondary | ICD-10-CM | POA: Diagnosis not present

## 2020-09-25 DIAGNOSIS — I10 Essential (primary) hypertension: Secondary | ICD-10-CM | POA: Diagnosis not present

## 2020-09-25 DIAGNOSIS — J45909 Unspecified asthma, uncomplicated: Secondary | ICD-10-CM | POA: Diagnosis not present

## 2020-09-25 DIAGNOSIS — S32502D Unspecified fracture of left pubis, subsequent encounter for fracture with routine healing: Secondary | ICD-10-CM | POA: Diagnosis not present

## 2020-09-25 DIAGNOSIS — Z7982 Long term (current) use of aspirin: Secondary | ICD-10-CM | POA: Diagnosis not present

## 2020-09-26 DIAGNOSIS — I739 Peripheral vascular disease, unspecified: Secondary | ICD-10-CM | POA: Diagnosis not present

## 2020-09-26 DIAGNOSIS — J45909 Unspecified asthma, uncomplicated: Secondary | ICD-10-CM | POA: Diagnosis not present

## 2020-09-26 DIAGNOSIS — S32502D Unspecified fracture of left pubis, subsequent encounter for fracture with routine healing: Secondary | ICD-10-CM | POA: Diagnosis not present

## 2020-09-26 DIAGNOSIS — I10 Essential (primary) hypertension: Secondary | ICD-10-CM | POA: Diagnosis not present

## 2020-09-26 DIAGNOSIS — Z7982 Long term (current) use of aspirin: Secondary | ICD-10-CM | POA: Diagnosis not present

## 2020-09-26 DIAGNOSIS — I4891 Unspecified atrial fibrillation: Secondary | ICD-10-CM | POA: Diagnosis not present

## 2020-09-29 DIAGNOSIS — I739 Peripheral vascular disease, unspecified: Secondary | ICD-10-CM | POA: Diagnosis not present

## 2020-09-29 DIAGNOSIS — J45909 Unspecified asthma, uncomplicated: Secondary | ICD-10-CM | POA: Diagnosis not present

## 2020-09-29 DIAGNOSIS — S32502D Unspecified fracture of left pubis, subsequent encounter for fracture with routine healing: Secondary | ICD-10-CM | POA: Diagnosis not present

## 2020-09-29 DIAGNOSIS — Z7982 Long term (current) use of aspirin: Secondary | ICD-10-CM | POA: Diagnosis not present

## 2020-09-29 DIAGNOSIS — I4891 Unspecified atrial fibrillation: Secondary | ICD-10-CM | POA: Diagnosis not present

## 2020-09-29 DIAGNOSIS — I10 Essential (primary) hypertension: Secondary | ICD-10-CM | POA: Diagnosis not present

## 2020-10-02 DIAGNOSIS — J45909 Unspecified asthma, uncomplicated: Secondary | ICD-10-CM | POA: Diagnosis not present

## 2020-10-02 DIAGNOSIS — I4891 Unspecified atrial fibrillation: Secondary | ICD-10-CM | POA: Diagnosis not present

## 2020-10-02 DIAGNOSIS — Z7982 Long term (current) use of aspirin: Secondary | ICD-10-CM | POA: Diagnosis not present

## 2020-10-02 DIAGNOSIS — I739 Peripheral vascular disease, unspecified: Secondary | ICD-10-CM | POA: Diagnosis not present

## 2020-10-02 DIAGNOSIS — I10 Essential (primary) hypertension: Secondary | ICD-10-CM | POA: Diagnosis not present

## 2020-10-02 DIAGNOSIS — S32502D Unspecified fracture of left pubis, subsequent encounter for fracture with routine healing: Secondary | ICD-10-CM | POA: Diagnosis not present

## 2020-10-06 DIAGNOSIS — G894 Chronic pain syndrome: Secondary | ICD-10-CM | POA: Diagnosis not present

## 2020-10-06 DIAGNOSIS — I739 Peripheral vascular disease, unspecified: Secondary | ICD-10-CM | POA: Diagnosis not present

## 2020-10-06 DIAGNOSIS — S81811A Laceration without foreign body, right lower leg, initial encounter: Secondary | ICD-10-CM | POA: Diagnosis not present

## 2020-10-06 DIAGNOSIS — R600 Localized edema: Secondary | ICD-10-CM | POA: Diagnosis not present

## 2020-10-06 DIAGNOSIS — S32502D Unspecified fracture of left pubis, subsequent encounter for fracture with routine healing: Secondary | ICD-10-CM | POA: Diagnosis not present

## 2020-10-06 DIAGNOSIS — K5909 Other constipation: Secondary | ICD-10-CM | POA: Diagnosis not present
